# Patient Record
Sex: Male | Born: 1950
Health system: Southern US, Community
[De-identification: ages and names within clinical notes are randomized; demographics above are authoritative.]

## PROBLEM LIST (undated history)

## (undated) DIAGNOSIS — M25519 Pain in unspecified shoulder: Secondary | ICD-10-CM

## (undated) DIAGNOSIS — I10 Essential (primary) hypertension: Secondary | ICD-10-CM

## (undated) DIAGNOSIS — E785 Hyperlipidemia, unspecified: Secondary | ICD-10-CM

## (undated) DIAGNOSIS — I679 Cerebrovascular disease, unspecified: Secondary | ICD-10-CM

## (undated) DIAGNOSIS — I2510 Atherosclerotic heart disease of native coronary artery without angina pectoris: Secondary | ICD-10-CM

## (undated) HISTORY — DX: Atherosclerotic heart disease of native coronary artery without angina pectoris: I25.10

## (undated) HISTORY — DX: Cerebrovascular disease, unspecified: I67.9

## (undated) HISTORY — PX: CAROTID ENDARTERECTOMY: SUR193

## (undated) HISTORY — DX: Essential (primary) hypertension: I10

## (undated) HISTORY — DX: Hyperlipidemia, unspecified: E78.5

## (undated) HISTORY — DX: Pain in unspecified shoulder: M25.519

## (undated) HISTORY — PX: PTCA: SHX146

---

## 2004-01-29 ENCOUNTER — Ambulatory Visit: Payer: Self-pay | Admitting: Internal Medicine

## 2004-01-29 ENCOUNTER — Ambulatory Visit: Payer: Self-pay

## 2004-01-29 ENCOUNTER — Ambulatory Visit: Payer: Self-pay | Admitting: Family Medicine

## 2004-01-30 ENCOUNTER — Ambulatory Visit (HOSPITAL_COMMUNITY): Admission: RE | Admit: 2004-01-30 | Discharge: 2004-02-01 | Payer: Self-pay | Admitting: Internal Medicine

## 2004-02-02 ENCOUNTER — Ambulatory Visit: Payer: Self-pay | Admitting: Cardiology

## 2004-02-06 ENCOUNTER — Encounter (INDEPENDENT_AMBULATORY_CARE_PROVIDER_SITE_OTHER): Payer: Self-pay | Admitting: *Deleted

## 2004-02-06 ENCOUNTER — Inpatient Hospital Stay (HOSPITAL_COMMUNITY): Admission: RE | Admit: 2004-02-06 | Discharge: 2004-02-10 | Payer: Self-pay | Admitting: Vascular Surgery

## 2004-02-19 ENCOUNTER — Observation Stay (HOSPITAL_COMMUNITY): Admission: EM | Admit: 2004-02-19 | Discharge: 2004-02-21 | Payer: Self-pay | Admitting: Emergency Medicine

## 2004-02-19 ENCOUNTER — Ambulatory Visit: Payer: Self-pay | Admitting: Internal Medicine

## 2004-02-23 ENCOUNTER — Ambulatory Visit: Payer: Self-pay

## 2004-03-05 ENCOUNTER — Ambulatory Visit: Payer: Self-pay | Admitting: Internal Medicine

## 2004-04-02 ENCOUNTER — Ambulatory Visit: Payer: Self-pay | Admitting: Internal Medicine

## 2004-04-07 ENCOUNTER — Ambulatory Visit: Payer: Self-pay | Admitting: Cardiology

## 2004-04-07 ENCOUNTER — Ambulatory Visit: Payer: Self-pay | Admitting: Internal Medicine

## 2004-04-07 ENCOUNTER — Ambulatory Visit: Payer: Self-pay

## 2004-04-07 ENCOUNTER — Inpatient Hospital Stay (HOSPITAL_COMMUNITY): Admission: AD | Admit: 2004-04-07 | Discharge: 2004-04-09 | Payer: Self-pay | Admitting: Cardiology

## 2004-04-23 ENCOUNTER — Ambulatory Visit: Payer: Self-pay | Admitting: Internal Medicine

## 2004-09-09 ENCOUNTER — Ambulatory Visit: Payer: Self-pay | Admitting: Internal Medicine

## 2005-01-03 ENCOUNTER — Ambulatory Visit: Payer: Self-pay | Admitting: Internal Medicine

## 2005-02-08 ENCOUNTER — Ambulatory Visit: Payer: Self-pay | Admitting: Family Medicine

## 2005-03-10 ENCOUNTER — Inpatient Hospital Stay (HOSPITAL_COMMUNITY): Admission: RE | Admit: 2005-03-10 | Discharge: 2005-03-11 | Payer: Self-pay | Admitting: Vascular Surgery

## 2005-03-10 ENCOUNTER — Encounter (INDEPENDENT_AMBULATORY_CARE_PROVIDER_SITE_OTHER): Payer: Self-pay | Admitting: *Deleted

## 2005-10-24 ENCOUNTER — Ambulatory Visit: Payer: Self-pay | Admitting: Internal Medicine

## 2006-06-01 ENCOUNTER — Other Ambulatory Visit: Payer: Self-pay

## 2006-06-01 ENCOUNTER — Emergency Department: Payer: Self-pay | Admitting: Unknown Physician Specialty

## 2006-06-05 ENCOUNTER — Telehealth: Payer: Self-pay | Admitting: Family Medicine

## 2006-06-05 DIAGNOSIS — M25519 Pain in unspecified shoulder: Secondary | ICD-10-CM

## 2006-06-12 ENCOUNTER — Ambulatory Visit: Payer: Self-pay | Admitting: Internal Medicine

## 2006-12-18 ENCOUNTER — Ambulatory Visit: Payer: Self-pay | Admitting: Internal Medicine

## 2006-12-18 LAB — CONVERTED CEMR LAB
ALT: 47 units/L (ref 0–53)
Total CHOL/HDL Ratio: 4.4
Triglycerides: 150 mg/dL — ABNORMAL HIGH (ref 0–149)

## 2006-12-22 ENCOUNTER — Ambulatory Visit: Payer: Self-pay | Admitting: Internal Medicine

## 2007-08-21 ENCOUNTER — Ambulatory Visit: Payer: Self-pay | Admitting: Internal Medicine

## 2007-08-21 LAB — CONVERTED CEMR LAB
AST: 27 units/L (ref 0–37)
HDL: 37.9 mg/dL — ABNORMAL LOW (ref >39.0)
Total CHOL/HDL Ratio: 4.1
VLDL: 18 mg/dL (ref 0–40)

## 2007-10-19 ENCOUNTER — Ambulatory Visit: Payer: Self-pay | Admitting: Internal Medicine

## 2007-11-13 ENCOUNTER — Ambulatory Visit: Payer: Self-pay | Admitting: Internal Medicine

## 2007-11-13 LAB — CONVERTED CEMR LAB
AST: 24 units/L (ref 0–37)
Total CHOL/HDL Ratio: 4.3
Triglycerides: 116 mg/dL (ref 0–149)

## 2008-04-16 ENCOUNTER — Ambulatory Visit: Payer: Self-pay | Admitting: Internal Medicine

## 2008-04-16 LAB — CONVERTED CEMR LAB: AST: 21 units/L (ref 0–37)

## 2008-04-19 DIAGNOSIS — E785 Hyperlipidemia, unspecified: Secondary | ICD-10-CM | POA: Insufficient documentation

## 2008-04-19 DIAGNOSIS — I2510 Atherosclerotic heart disease of native coronary artery without angina pectoris: Secondary | ICD-10-CM | POA: Insufficient documentation

## 2008-04-19 DIAGNOSIS — I10 Essential (primary) hypertension: Secondary | ICD-10-CM | POA: Insufficient documentation

## 2008-04-19 DIAGNOSIS — I679 Cerebrovascular disease, unspecified: Secondary | ICD-10-CM | POA: Insufficient documentation

## 2008-04-19 DIAGNOSIS — I200 Unstable angina: Secondary | ICD-10-CM | POA: Insufficient documentation

## 2008-04-28 ENCOUNTER — Encounter: Payer: Self-pay | Admitting: Internal Medicine

## 2008-04-28 ENCOUNTER — Ambulatory Visit: Payer: Self-pay | Admitting: Internal Medicine

## 2008-04-28 DIAGNOSIS — J329 Chronic sinusitis, unspecified: Secondary | ICD-10-CM | POA: Insufficient documentation

## 2008-11-04 ENCOUNTER — Encounter: Payer: Self-pay | Admitting: Internal Medicine

## 2008-11-24 ENCOUNTER — Telehealth (INDEPENDENT_AMBULATORY_CARE_PROVIDER_SITE_OTHER): Payer: Self-pay | Admitting: *Deleted

## 2009-02-10 ENCOUNTER — Ambulatory Visit: Payer: Self-pay | Admitting: Internal Medicine

## 2009-02-11 LAB — CONVERTED CEMR LAB
Cholesterol: 152 mg/dL (ref 0–200)
Triglycerides: 91 mg/dL (ref 0.0–149.0)

## 2009-02-12 ENCOUNTER — Ambulatory Visit: Payer: Self-pay | Admitting: Internal Medicine

## 2010-01-21 ENCOUNTER — Ambulatory Visit: Payer: Self-pay | Admitting: Internal Medicine

## 2010-02-23 NOTE — Assessment & Plan Note (Signed)
Summary: rov      Allergies Added: NKDA  Visit Type:  Follow-up Primary Provider:  dr Janice Norrie   History of Present Illness: Patient is a 60 year old with a history of CAD (s/p PTCA/stent of the RCA, cutting angioplasty of the LAD in 2006), CV dz (s/p bilateral CEA) and dyslipidemia.  I last saw him in April of last year. Since seen he has done ok.  He is on his feet all day at work without problem.  Denies chest pain, no shortness of breath (does have sinus issues).    Current Medications (verified): 1)  Amlodipine Besylate 5 Mg Tabs (Amlodipine Besylate) .... One By Mouth Daily 2)  Metoprolol Succinate 25 Mg Xr24h-Tab (Metoprolol Succinate) .... One By Mouth Daily 3)  Imdur 30 Mg Xr24h-Tab (Isosorbide Mononitrate) .... One By Mouth Daily 4)  Asprin 81mg  .... One By Mouth Daily 5)  Crestor 40 Mg Tabs (Rosuvastatin Calcium) .Marland Kitchen.. 1 Tablet Every Day Before Bedtime  Allergies (verified): No Known Drug Allergies  Past History:  Past Medical History: Last updated: 04/19/2008 DYSLIPIDEMIA (ICD-272.4) HYPERTENSION (ICD-401.9) CEREBROVASCULAR DISEASE (ICD-437.9) CAD (ICD-414.00) PAIN IN JOINT, SHOULDER (ICD-719.41)  Past Surgical History: Last updated: 04/19/2008  CVOD status post left carotid endarterectomy January 2006 by Dr.Early. status post PTCA and stent placement in January 2996. History of appendectomy. Right carotid endarterectomy with Dacron patch angioplasty by Dr. Tawanna Cooler Early. left carotid endarterectomy on February 06, 2004 by Dr. Arbie Cookey.  Social History: Last updated: 04/19/2008 He resides in [place]. He is employed by [employer] as a Production designer, theatre/television/film. Married  Tobacco Use - Yes.  Alcohol Use - yes  Review of Systems       All systems reviewed.  Negative to the above problem except as noted above.  Vital Signs:  Patient profile:   60 year old male Height:      72 inches Weight:      175 pounds BMI:     23.82 Pulse rate:   85 / minute BP sitting:   133  / 84  (left arm) Cuff size:   regular  Vitals Entered By: Burnett Kanaris, CNA (February 12, 2009 4:31 PM)  Physical Exam  Additional Exam:  HEENT:  Normocephalic, atraumatic. EOMI, PERRLA.  Neck: JVP is normal. No thyromegaly. No bruits.  Lungs: clear to auscultation. No rales no wheezes.  Heart: Regular rate and rhythm. Normal S1, S2. No S3.   No significant murmurs. PMI not displaced.  Abdomen:  Supple, nontender. Normal bowel sounds. No masses. No hepatomegaly.  Extremities:   Good distal pulses throughout. No lower extremity edema.  Musculoskeletal :moving all extremities.  Neuro:   alert and oriented x3.    EKG  Procedure date:  02/12/2009  Findings:      NSR.  85 bpm.  Anteroseptal MI.  Impression & Recommendations:  Problem # 1:  CAD (ICD-414.00) Doing well.  NO signs to suggest ischemia.  Keep on same regimen.  Stay active.  Problem # 2:  DYSLIPIDEMIA (ICD-272.4) I increased Crestor to 40 and last lipid panel in late october LDL was 89, HDL was 44.  Check in 1 year. His updated medication list for this problem includes:    Crestor 40 Mg Tabs (Rosuvastatin calcium) .Marland Kitchen... 1 tablet every day before bedtime  Problem # 3:  CEREBROVASCULAR DISEASE (ICD-437.9) s/p CEA bilaterally.  Risk factor modification. Orders: EKG w/ Interpretation (93000)  Problem # 4:  HYPERTENSION (ICD-401.9) Adequate control. His updated medication list for this problem  includes:    Amlodipine Besylate 5 Mg Tabs (Amlodipine besylate) ..... One by mouth daily    Metoprolol Succinate 25 Mg Xr24h-tab (Metoprolol succinate) ..... One by mouth daily  Patient Instructions: 1)  F/U in 12 months.

## 2010-03-24 ENCOUNTER — Telehealth: Payer: Self-pay | Admitting: Internal Medicine

## 2010-03-31 ENCOUNTER — Encounter (INDEPENDENT_AMBULATORY_CARE_PROVIDER_SITE_OTHER): Payer: Self-pay | Admitting: *Deleted

## 2010-04-01 NOTE — Progress Notes (Signed)
Summary: question re lab work   Phone Note Call from Patient   Caller: Patient [phone] Reason for Call: Talk to Nurse Summary of Call: pt made fu appt with dr Tenny Craw does he need blood work Initial call taken by: Glynda Jaeger,  March 24, 2010 9:44 AM  Follow-up for Phone Call        Phone Call Completed, Appt Scheduled:  patient to come in on the 23rd of March(Friday) for fasting lipid and liver panel per notes of Dr. Tenny Craw. Follow-up by: Dessie Coma  LPN,  March 24, 2010 9:53 AM

## 2010-04-03 ENCOUNTER — Encounter: Payer: Self-pay | Admitting: Internal Medicine

## 2010-04-06 NOTE — Letter (Signed)
Summary: Appointment - Reschedule  [facility], Main Office  [address]   [place], [place] [address]   Phone: [phone]  Fax: [phone]     March 31, 2010 MRN: [id number]   [facility] [address] [place], [place]  [address]   Dear Mr. Hegner,   Due to a change in our office schedule, your appointment on March,26,2012 at 10:00 must be changed.  It is very important that we reach you to reschedule this appointment. We look forward to participating in your health care needs. Please contact us at the number listed above at your earliest convenience to reschedule this appointment.     Sincerely, Control and instrumentation engineer

## 2010-04-16 ENCOUNTER — Other Ambulatory Visit (INDEPENDENT_AMBULATORY_CARE_PROVIDER_SITE_OTHER): Payer: Self-pay | Admitting: *Deleted

## 2010-04-16 DIAGNOSIS — E7800 Pure hypercholesterolemia, unspecified: Secondary | ICD-10-CM

## 2010-04-16 DIAGNOSIS — Z79899 Other long term (current) drug therapy: Secondary | ICD-10-CM

## 2010-04-16 LAB — LIPID PANEL
HDL: 43.4 mg/dL (ref >39.00)
LDL Cholesterol: 78 mg/dL (ref 0–99)
Total CHOL/HDL Ratio: 3
Triglycerides: 145 mg/dL (ref 0.0–149.0)

## 2010-04-16 LAB — HEPATIC FUNCTION PANEL: Albumin: 4 g/dL (ref 3.5–5.2)

## 2010-04-19 ENCOUNTER — Ambulatory Visit: Payer: Self-pay | Admitting: Internal Medicine

## 2010-04-22 ENCOUNTER — Other Ambulatory Visit: Payer: Self-pay | Admitting: Cardiology

## 2010-04-22 DIAGNOSIS — I10 Essential (primary) hypertension: Secondary | ICD-10-CM

## 2010-04-22 NOTE — Telephone Encounter (Signed)
Called in to pharmacy gave pt ISOSORBIDE MN ER 30 MG TABLET daily he was requesting 60mg  1/2 tab qd due to the pharmacy being out of stock. Pharmacy states they have them in now so I refilled this as prescribed

## 2010-04-23 ENCOUNTER — Encounter: Payer: Self-pay | Admitting: Internal Medicine

## 2010-04-23 ENCOUNTER — Ambulatory Visit (INDEPENDENT_AMBULATORY_CARE_PROVIDER_SITE_OTHER): Payer: 59 | Admitting: Internal Medicine

## 2010-04-23 DIAGNOSIS — I2510 Atherosclerotic heart disease of native coronary artery without angina pectoris: Secondary | ICD-10-CM

## 2010-04-23 DIAGNOSIS — I10 Essential (primary) hypertension: Secondary | ICD-10-CM

## 2010-04-23 DIAGNOSIS — E785 Hyperlipidemia, unspecified: Secondary | ICD-10-CM

## 2010-04-23 NOTE — Assessment & Plan Note (Signed)
No symptoms to suggest ischemia. I would keep him on the same regimen.

## 2010-04-23 NOTE — Patient Instructions (Signed)
Your physician recommends that you schedule a follow-up appointment in: 12 months with Dr Tenny Craw.

## 2010-04-23 NOTE — Assessment & Plan Note (Signed)
He says his blood pressure is better at home in the 120/70 range. I told him he needs to bring his cuff in periodically the doctor's office to make sure it is working correctly. I would not change his regimen.

## 2010-04-23 NOTE — Assessment & Plan Note (Signed)
Lipids from last week show good control with an LDL of 78, HDL of 43. Keep on same regimen.

## 2010-04-23 NOTE — Progress Notes (Signed)
   Patient ID: [name], male    DOB: 04-23-1950, 60 y.o.   MRN: [id number] HPI Patient is a 60 year old with a history of CAD (s/p PTCA/stent of the RCA, cutting angioplasty of the LAD in 2006), CV dz (s/p bilateral CEA) and dyslipidemia.  I last saw him in April of last year. Since I saw him he has done well. He denies chest pain, shortness of breath, change in his ability to do things. Unfortunately, he has been laid off from his job. He is working more at home. No Known Allergies  Current Outpatient Prescriptions  Medication Sig Dispense Refill  . amLODipine (NORVASC) 5 MG tablet Take 5 mg by mouth daily.        Marland Kitchen aspirin 81 MG tablet Take 81 mg by mouth daily.        . isosorbide mononitrate (IMDUR) 30 MG 24 hr tablet Take 1 tablet (30 mg total) by mouth daily.  30 tablet  11  . metoprolol succinate (TOPROL-XL) 25 MG 24 hr tablet Take 25 mg by mouth daily.        . rosuvastatin (CRESTOR) 40 MG tablet Take 40 mg by mouth daily.          Past Medical History  Diagnosis Date  . Dyslipidemia   . Hypertension   . Cerebrovascular disease   . Coronary artery disease   . Pain in joint, shoulder region     Past Surgical History  Procedure Date  . Ptca     stent placement January 2006  . Carotid endarterectomy     left/Jan. 2006/ right;Dacron patch angioplasty    Family History  Problem Relation Age of Onset  . Alzheimer's disease Mother   . Heart disease Father     History   Social History  . Marital Status: Married    Spouse Name: N/A    Number of Children: N/A  . Years of Education: N/A   Occupational History  . Production designer, theatre/television/film     [employer]   Social History Main Topics  . Smoking status: Smoker, Current Status Unknown  . Smokeless tobacco: Not on file  . Alcohol Use: Yes  . Drug Use: Not on file  . Sexually Active: Not on file   Other Topics Concern  . Not on file   Social History Narrative  . No narrative on file    Review of Systems:  All systems  reviewed.  They are negative to the above problem except as previously stated.  Vital Signs: BP 146/93  Pulse 84  Resp 18  Ht 5\' 11"  (1.803 m)  Wt 167 lb 1.9 oz (75.805 kg)  BMI 23.31 kg/m2  Physical Exam  HEENT:  Normocephalic, atraumatic. EOMI, PERRLA.  Neck: JVP is normal. No thyromegaly. No bruits.  Lungs: clear to auscultation. No rales no wheezes.  Heart: Regular rate and rhythm. Normal S1, S2. No S3.   No significant murmurs. PMI not displaced.  Abdomen:  Supple, nontender. Normal bowel sounds. No masses. No hepatomegaly.  Extremities:   Good distal pulses throughout. No lower extremity edema.  Musculoskeletal :moving all extremities.  Neuro:   alert and oriented x3.  CN II-XII grossly intact.  EKG:  SR.  72 bpm.  Septal MI.  Assessment and Plan:    HPI    Review of Systems    Physical Exam

## 2010-05-02 ENCOUNTER — Other Ambulatory Visit: Payer: Self-pay | Admitting: Internal Medicine

## 2010-06-08 NOTE — Assessment & Plan Note (Signed)
[facility]                            CARDIOLOGY OFFICE NOTE   NAME:[name], [name]                        MRN:          [id number]  DATE:10/19/2007                            DOB:          [birth year]    IDENTIFICATION:  [name] is a 60 year old gentleman with CAD,  cerebrovascular disease, and dyslipidemia.  He comes in today for  continued care.   On talking the patient, he denies shortness of breath.  No chest pain.  He has noted no change in his ability to do things.   He does note a longstanding rash since he says the procedure he had  done.  He also notes some sinus issues with congestion.   His current medicines include:  1. Toprol-XL 25.  2. Isorbid 30.  3. Amlodipine 5.  4. Crestor 20.  5. Aspirin 81.  6. Fish oil.   PHYSICAL EXAMINATION:  GENERAL:  The patient is in no distress at rest.  VITAL SIGNS:  Blood pressure is 139/94, pulse is 78, and weight 174.  LUNGS:  Clear.  NECK:  JVP is normal.  Bilateral CEA scars.  CARDIAC:  Regular rate and rhythm.  S1 and S2.  No S3.  No significant  murmurs.  ABDOMEN:  Benign.  EXTREMITIES:  No edema.  SKIN:  Urticarial type rash plus serpiginous rash in his arms and back.   IMPRESSION:  1. Coronary artery disease.  I am not concerned of any active issues      and I think he is doing well clinically.  He is status post      percutaneous transluminal coronary angioplasty to an ostial      diagonal and percutaneous transluminal coronary angioplasty/stent      (drug-eluting) to the restenotic area of the RCA.  This was done in      2006.  I would keep him on his current regimen.  2. Dyslipidemia.  We will again need to have fasting lipids done and I      will follow up with him.  3. Hypertension.  The patient was stressed getting hear this is the      highest value.  I told him to keep an eye on things and send in a      log.  4. Rash.  He has had intermittent rash in the past, but I have  not      seen like this.  I will refer him to dermatology.   Otherwise, I will set to see him back in the spring, sooner if problems  develop.  I will be in touch with him the blood work.  Hopefully, I will  hear back from dermatology.     Pricilla Riffle, MD, [facility]  Electronically Signed    PVR/MedQ  DD: 10/22/2007  DT: 10/23/2007  Job #: [id number]

## 2010-06-08 NOTE — Assessment & Plan Note (Signed)
[facility]                                 ON-CALL NOTE   NAME:[name], [name]                MRN:          [id number]  DATE:06/01/2006                            DOB:          1950/12/16    PRIMARY CARDIOLOGIST:  Dr. Dietrich Pates.   I received a call from Dr. Aurelio Jew at [facility] emergency room. He stated  that he had [name] there who was a patient of Dr. Tenny Craw' and  had come in with atypical chest pain. He stated that his symptoms have  resolved and his initial enzymes were negative. Dr. Aurelio Jew did not feel  that any further inpatient evaluation was indicated but requested that  he be seen as soon as possible in our office for cardiac evaluation. I  have left with a message with our office to that effect and called Dr.  Aurelio Jew back to advise him that our office would be giving [name] a  call tomorrow to let him know when to come in. Dr. Aurelio Jew felt that this  was the appropriate plan of care.      Theodore Demark, PA-C  Electronically Signed      Gerrit Friends. Dietrich Pates, MD, [facility]  Electronically Signed   RB/MedQ  DD: 06/01/2006  DT: 06/02/2006  Job #: [id number]

## 2010-06-08 NOTE — Assessment & Plan Note (Signed)
[facility]                            CARDIOLOGY OFFICE NOTE   NAME:[name], [name]                        MRN:          [id number]  DATE:12/22/2006                            DOB:          [birth year]    IDENTIFICATION:  [name] is a 60 year old gentleman with a history of  CAD.  His last procedure was in March, 2006, he underwent cutting  balloon angioplasty to an ostial diagonal lesion and PTCA stent (drug  eluting) to a restenotic lesion of the RCA.  I last saw him in May.   In the interval he denies chest pressure, no shortness of breath, he is  active. He only notes that he wakes up a night urinating a little more  frequently, 2-3 times.  He says there is no pain on urination and no  blood in his urine.  He says he will have 1 or 2 cokes after dinner but  this the usual for him.   He is still smoking some, about 1/3 of a pack per day. Has some achiness  in his calves all the time, not with walking.   CURRENT MEDICATIONS:  Include aspirin 325 mg, Metoprolol ER 25, Plavix  75, Isorbid 30, Vytorin 10/40, amlodipine.   REVIEW OF SYSTEMS:  Rash intermittently on body.   PHYSICAL EXAMINATION:  The patient is in no distress.  Blood pressure  124/81, pulse is 79, weight 169.  LUNGS:  Clear.  CARDIAC EXAM:  Regular rate and rhythm S1, S2, no S3, no murmurs.  EXTREMITIES:  Small macular rash on arms (a few lesions actually).   IMPRESSION:  1. Coronary artery disease.  Clinically doing well.  He is now more      than a couple of years out from his last intervention.  He is off      of aspirin but taking Plavix.  I told him he can stop Plavix and      start 81 mg aspirin.  2. Dyslipidemia. Achy on the Vytorin, he had been on Lipitor in the      past and actually his lipid panel was better. Now his LDL is 93,      HDL of 36. I would stop the Vytorin.  I would like him to call in a      couple of weeks to see how he is doing.  I have given him  prescriptions for Lipitor 80 as well as some samples.  Will need      followup lipids in about 12 weeks if he starts on this.  3. Renal.  Will check a urinalysis. May indeed need to see a      urologist.   Set to see him back in the spring or summer, sooner if problems develop.  He was given a flu shot today.    Pricilla Riffle, MD, [facility]  Electronically Signed   PVR/MedQ  DD: 12/22/2006  DT: 12/22/2006  Job #: [id number]

## 2010-06-08 NOTE — Assessment & Plan Note (Signed)
[facility]                            CARDIOLOGY OFFICE NOTE   NAME:[name], [name]                        MRN:          [id number]  DATE:06/12/2006                            DOB:          Jul 24, 1950    PATIENT IDENTIFICATION:  [name] is a 60 year old gentleman with a  history of CAD. He was last seen in October of last year. He is status  post PTCA stent to the  RCA and last procedure underwent PTCA stent to the RCA and cutting  balloon angioplasty of LAD. The patient has a CYPHER stent in the RCA.   Since seen, he has been doing well from a cardiac standpoint. He denies  chest pain, active, still smoking.   His biggest complaint today is left shoulder and arm pain. Note he was  actually ruled out for MI at [facility] about a week  ago. He has been seen at [facility] for his shoulder and is  due to have a CT scan done.   CURRENT MEDICATIONS:  1. Aspirin 325 daily.  2. Metoprolol XL 25 daily.  3. Plavix 75 daily.  4. Isorbid 30 daily.  5. Vytorin 10/40 daily.  6. Amlodipine 5 daily.   PHYSICAL EXAMINATION:  GENERAL:  The patient is in no distress.  VITAL SIGNS:  Blood pressure 127/83, pulse is 70, weight 170.  NECK:  JVP is normal. Bilateral CEA scars.  LUNGS:  Clear. No rales.  CARDIAC:  Regular rate and rhythm, S1, S2, no S3. No murmurs.  ABDOMEN:  Benign.  EXTREMITIES:  No edema. 2+ pulses, did not examine left shoulder or arm.   A 12-lead EKG normal sinus rhythm, 74 beats per minute. Occasional PVC.  Anteroseptal MI. Unchanged from previous.   IMPRESSION:  1. Coronary artery disease. Clinically stable. Again, last      intervention was in March of 2006. The patient underwent cutting      balloon angioplasty of the ostial diagonal to the left anterior      descending and PTCA stent (drug-eluting) of a restenotic lesion in      the right coronary artery. He has been active until his shoulder      and  arm began hurting. Overall, I think he should be at low risk      from a cardiac standpoint. He is over 1 year from stent placement.      If needed, he can come off Plavix but I would resume as soon as      possible and he should continue on the Plavix.  2. Dyslipidemia. Will need to check on fasting lipid panel when he      returns.  3. Hypertension. Good control.   All set to see the patient back in about 6 months, sooner if problems  develop.     Pricilla Riffle, MD, [facility]  Electronically Signed    PVR/MedQ  DD: 06/12/2006  DT: 06/12/2006  Job #: [id number]   cc:   [facility]

## 2010-06-11 NOTE — Cardiovascular Report (Signed)
NAMEALYAAN, BUDZYNSKI              ACCOUNT NO.:  [id number]   MEDICAL RECORD NO.:  [id number]          PATIENT TYPE:  OIB   LOCATION:  2856                         FACILITY:  [facility]   PHYSICIAN:  Arturo Morton. Riley Kill, M.D. [facility] OF BIRTH:  02-09-1950   DATE OF PROCEDURE:  DATE OF DISCHARGE:                              CARDIAC CATHETERIZATION   Audio too short to transcribe (less than 5 seconds)       TDS/MEDQ  D:  01/30/2004  T:  01/30/2004  Job:  [id number]

## 2010-06-11 NOTE — Cardiovascular Report (Signed)
NAMEPARLEY, [name]              ACCOUNT NO.:  [id number]   MEDICAL RECORD NO.:  [id number]          PATIENT TYPE:  OIB   LOCATION:  2856                         FACILITY:  [facility]   PHYSICIAN:  [name], M.D. [facility] OF BIRTH:  16-Oct-1950   DATE OF PROCEDURE:  01/30/2004  DATE OF DISCHARGE:                              CARDIAC CATHETERIZATION   INDICATIONS:  [name] is a 60 year old gentleman who presented with an  episode of chest pain.  On examination, he was noted to have bilateral  carotid bruits, and noninvasive studies suggested high-grade bilateral  carotid disease.  This was being reviewed by Dr. Samule [name].  In addition, the  patient has had an episode of chest pain and has multiple cardiac risk  factors.  He also has an abnormal chest x-ray suggesting fullness in the  right hilum.  With this, it was felt that cardiac catheterization was  indicated.   PROCEDURE:  1.  Left heart catheterization.  2.  Selective coronary arteriography.  3.  Selective left ventriculography.  4.  Subclavian angiography.   OPERATOR:  [name], M.D.   DESCRIPTION OF PROCEDURE:  The patient was taken to the cardiac  catheterization lab, prepped and draped in the usual fashion.  Through an  anterior puncture, the right femoral artery was easily entered.  A 6 French  sheath was placed. The central aortic and left ventricular pressures were  measured with a pigtail.  Ventriculography was performed in the RAO  projection.  Following a pressure pull-back, coronary angiograms were  performed.   There was initial damping of the catheter in the left coronary artery.  We  then pulled this out and we exchanged for a right coronary catheter.  Using  the right coronary artery, we obtained before and after intracoronary  nitroglycerin. Subclavian angiography was then performed. We used a JL-4.5  guiding catheter and then injected the left coronary.  It was evident that  there was a  separate ostium for both the LAD and the circumflex, and that  the LAD itself had an ostial 40-50% narrowing.   Following this, we took a JL-5 catheter to engage the circumflex artery,  which was a large, independent artery.  The patient ended up tolerating the  procedure well.   I then discussed the case with both Dr. Tenny [name] and Dr. Samule [name], and then  discussed the case with the patient's family.  He was taken to the holding  area in satisfactory, clinical condition.   HEMODYNAMIC DATA:  1.  Central aortic pressure was 150/92 with a mean of 116.  2.  Left ventricular pressure 148/8.  3.  No gradient in pullback across the aortic valve.   ANGIOGRAPHIC DATA:  1.  Ventriculography was performed in the RAO projection because of      ventricular ectopy. Angiographic ejection fraction could not be      calculated, however, no definite wall motion abnormalities were seen and      overall EF appeared to be well-preserved.  2.  The left anterior descending artery and circumflex have separate ostia.  3.  The  left anterior descending artery courses to the apex.  There is a      major diagonal branch.  The ostium of the LAD is difficult to lay out      but appears to have about a 50% area of segmental plaquing proximally.      Beyond this, there is a focal area of tapered narrowing of about 40%.      The LAD beyond this has a fair amount of luminal irregularity but no      critical stenoses.  The diagonal is a fairly small to moderate sized      vessel that has about 80% ostial narrowing, and then a second 80%      narrowing in its mid-portion.  It appears to be about a  1.5-2 mm artery.  1.  The circumflex is a large caliber vessel.  There is a tiny, first      marginal branch and then an extremely large, second marginal branch.      These have luminal irregularities only.  There is about a 40% area of      segmental plaquing in the AV circumflex after the take-off of the 3      marginal  branches. This leads into a posterolateral system that has 3      posterolateral branches and a co-PDA.  These vessels do not have      critical narrowing, other than the segmental area of 40% narrowing as      previously noted.  2.  The right coronary artery provides a simple, posterior descending artery      which is small in caliber.  In the mid-portion, there are tandem lesions      of 50, 80, 70 and 50%, in what appears to be about a 2 mm artery.   CONCLUSIONS:  1.  Preserved overall left ventricular function.  2.  High grade stenosis of the right coronary artery and diagonal vessel.  3.  Moderate stenosis of the ostium of the left anterior descending artery      and mid-left anterior descending.  4.  Large dominant, co-dominant circumflex system.   DISPOSITION:  I have discussed the case with Dr. Samule [name], he is reviewing the  carotid studies.  We may need to deal with the carotids prior to dealing  with the coronaries.  The right could be stented, although a non-drug  eluding platform will likely be needed, given vessel size.  We will defer  coronary intervention until a plan regarding his carotids is dealt with.       TDS/MEDQ  D:  01/30/2004  T:  01/30/2004  Job:  [id number]   cc:   [name], M.D. [facility]   [name], M.D.   CV Laboratory   [name], M.D. [facility]  [address]  [address]  [place]  [place] [address]

## 2010-06-11 NOTE — H&P (Signed)
[name], [name]              ACCOUNT NO.:  [id number]   MEDICAL RECORD NO.:  [id number]          PATIENT TYPE:  INP   LOCATION:  NA                           FACILITY:  [facility]   PHYSICIAN:  Larina Earthly, M.D.    DATE OF BIRTH:  November 16, 1950   DATE OF ADMISSION:  02/06/2004  DATE OF DISCHARGE:                                HISTORY & PHYSICAL   CHIEF COMPLAINT:  Carotid artery disease.   HISTORY OF PRESENT ILLNESS:  This is a 60 year old male who was hospitalized  last week after an apparent episode of chest pain.  He is somewhat of a poor  historian and does not have the results of the numerous tests that were  apparently performed.  Apparently, during the evaluation he was found to  have carotid bruits.  A Doppler examination reportedly shows severe  bilateral disease.  He was seen by Dr. Tawanna Cooler Early for vascular surgical  consultation, and he has subsequently been recommended for a left carotid  endarterectomy.  The patient has stated he reports he had multiple tests,  however is unsure of any other diagnostic findings as related to his cardiac  evaluation.  He has an unclear understanding of the whole clinical situation  but does understand that the carotid disease is quite significant and the  procedure is to be done for stroke prevention.  He denies headache, nausea,  vomiting, vertigo or dizziness, history of falls, seizures, muscle weakness,  speech impairment, dysphagia, syncope, presyncope, memory loss, or  confusion.  He does admit to some occasional numbness and tingling in the  right arm.  He also admits to an episode of right eye blurred vision lasting  a few minutes several weeks ago.  It was not frank amaurosis fugax.  He will  be admitted on February 06, 2004, for elective left carotid endarterectomy.  Of note, the patient did understand that he was supposed to have a right  carotid endarterectomy.   PAST MEDICAL HISTORY:  1.  Carotid artery disease.  2.   Questionable lipid status.  3.  Questionable coronary artery disease.   PAST SURGICAL HISTORY:  Appendectomy, age 60.   CURRENT MEDICATIONS:  1.  Aspirin 325 mg daily.  2.  Lipitor 80 mg daily.  3.  Toprol XL 25 mg daily.  4.  Nitroglycerin 0.4 mg p.r.n.  5.  He was also given a prescription for Wellbutrin 150 mg b.i.d. but is not      taking.   ALLERGIES:  None.   REVIEW OF SYSTEMS:  Otherwise noncontributory for full system review with  the exception of the aforementioned chest pain event from last week.   FAMILY HISTORY:  Remarkable for coronary disease.  His father is deceased at  age 28 secondary to myocardial infarction.   SOCIAL HISTORY:  He is married with one child.  He uses approximately a six-  pack of beer each week.  Tobacco use is approximately one pack per day for  40 years.  He is trying to quit and at current status is two to three  cigarettes per day.  PHYSICAL EXAMINATION:  VITAL SIGNS:  Blood pressure 124/74, pulse 76,  respirations 16.  GENERAL:  This is a 60 year old white male in no acute distress, alert and  oriented x3.  HEENT:  Normocephalic, atraumatic.  Pupils equal, round, and reactive to  light.  Extraocular movements intact.  Pharynx is clear.  Teeth are in fair  dentition.  NECK:  Supple, no jugular venous distention, positive carotid bruits, no  lymphadenopathy.  CHEST:  Symmetrical on inspiration.  No wheezes, rhonchi, or rales.  CARDIAC:  Regular rate and rhythm, normal S1, S2.  No murmurs, gallops, or  rubs.  ABDOMEN:  Soft, nontender, normoactive bowel sounds.  No masses, no bruits.  GENITOURINARY, RECTAL:  Deferred.  EXTREMITIES:  No clubbing, cyanosis, no edema, no ulcerations.  SKIN:  Warm to the touch.  PERIPHERAL PULSES:  Palpable posterior tibial bilateral.  NEUROLOGIC:  Nonfocal.  Cranial nerves II-XII grossly intact.  Gait is  steady.  Deep tendon reflexes equal and intact bilateral.  Muscle strength  equal and intact  bilateral.   ASSESSMENT:  Severe bilateral carotid artery disease, for planned left  carotid endarterectomy.  Other diagnoses as per history.      WEG/MEDQ  D:  02/04/2004  T:  02/04/2004  Job:  [id number]   cc:   Pricilla Riffle, M.D.

## 2010-06-11 NOTE — Cardiovascular Report (Signed)
[name], [name]              ACCOUNT NO.:  [id number]   MEDICAL RECORD NO.:  [id number]          PATIENT TYPE:  INP   LOCATION:  3305                         FACILITY:  [facility]   PHYSICIAN:  Salvadore Farber, M.D. LHCDATE OF BIRTH:  [birth year]   DATE OF PROCEDURE:  02/09/2004  DATE OF DISCHARGE:                              CARDIAC CATHETERIZATION   PROCEDURE:  Coronary angiography, drug-eluting stent placement x2 to the  RCA.   INDICATIONS:  Mr. Mecham as a 60 year old gentleman who presented 10 days  ago with chest discomfort. He ruled out for myocardial infarction and was  found to have bilateral greater than 95% carotid stenoses. He subsequently  underwent cardiac catheterization which demonstrated a severe stenosis of  the relatively small RCA. We carefully weighs cardiac versus cerebral risk  and decided to perform revascularization of his carotid first. This was  performed on February 06, 2004 by Dr. Arbie Cookey. He had no cardiac complications.  Yesterday he was started on Plavix in anticipation of revascularization of  the RCA. He has done well without neurologic or cardiac compromise since his  surgery. He comes the lab today for planned repeat angiography and  intervention on at least the RCA.   PROCEDURAL TECHNIQUE:  Informed consent was obtained. Under 1% lidocaine  local anesthesia, a 6-French sheath was placed in the right common femoral  artery using the modified Seldinger technique. Anticoagulation was initiated  with bivalirudin. ACT was confirmed to be greater than 225 seconds. I began  with coronary angiography of the left system using JL-4 for the circumflex  and JL-3.5 for the LAD.   Attention was then turned to the RCA. An ART 3.5 guide was advanced over  wire and engaged in the ostium of the RCA. A Prowater wire was advanced in  the distal vessel without difficulty. The distal portion of the lesion was  directly stented using a 2.5 x 20 mm Taxus deployed at  12 atmospheres. The  more proximal portion of the lesion was then stented using a 2.75 x 28 mm  Taxus deployed at 14 atmospheres. This stent was positioned so as to overlap  the previously placed stent by approximately 3 mm. The distal portion of the  distal stent was then postdilated using a 2.5 x 18 mm PowerSail at 14  atmospheres. The remainder of the stents were then postdilated using a 3.0 x  18 mm PowerSail for three sequential inflations at 16 atmospheres. Final  angiography demonstrated no residual stenosis, no dissection, and TIMI-3  flow to the distal vasculature. The patient tolerated the procedure well and  was transferred to the holding room in stable condition.   COMPLICATIONS:  None.   FINDINGS:  1.  Left main: There is no left main. There are separate ostia of the LAD      and circumflex.  2.  LAD: The LAD is unchanged from the study of January 30, 2004.      Specifically, there is a 50% ostial stenosis and a 40% proximal      stenosis. The vessel gives rise to 2 substantial diagonal branches. The  remainder of the vessel has only minor luminal irregularities.  3.  Circumflex: Moderate size vessel giving rise to 3 obtuse marginals.      There is 30% stenosis of the proximal vessel.  4.  RCA: Relatively smaller dominant vessel. There is a 30% stenosis of the      proximal vessel, diffuse disease of the mid vessel with up to 70%      stenosis. The RCA was treated with 2 overlapping drug-eluting stents      resulting in no residual stenosis.   IMPRESSION/RECOMMENDATIONS:  1.  No change in coronary anatomy compared with January 30, 2004.  2.  Successful drug-eluting stent placement (x2) in the mid RCA with      excellent angiographic result.   The patient should be continued on aspirin indefinitely and Plavix for at  least 1 year. Plavix should not be stopped during this period.      WED/MEDQ  D:  02/09/2004  T:  02/09/2004  Job:  [id number]   cc:   Pricilla Riffle,  M.D.   Marne A. Milinda Antis, M.D. [facility]

## 2010-06-11 NOTE — Assessment & Plan Note (Signed)
[facility]                                   ON-CALL NOTE   NAME:[name], [name]                        MRN:          [id number]  DATE:10/02/2005                            DOB:          1950/06/05    I received a call from [phone] through the answering service.  [name] states he was a patient of Dr. Tenny Craw'.  He had called last week to  get refill son his medications and had not heard anything back from our  office and states that his pharmacy had not received a refill permission.  I  ask him what pharmacy he used.  It is [facility] in [place].  I called the  pharmacy at [phone] for refills on [name]' Norvasc 5 mg, 1 p.o. q.day,  #30 tablets with 6 refills; Vytorin 10/40 mg, 1 p.o. q.h.s., #30 tablets  with 6 refills; Wellbutrin 150 mg, 1 p.o. b.i.d., #60 tablets with 6  refills.  The patient was instructed to keep his follow-up appointment with  Dr. Tenny Craw this week.                                   Dorian Pod, ACNP   MB/MedQ  DD:  10/02/2005  DT:  10/02/2005  Job #:  [id number]

## 2010-06-11 NOTE — Discharge Summary (Signed)
[name], [name]                 ACCOUNT NO.:  [id number]   MEDICAL RECORD NO.:  [id number]          PATIENT TYPE:  INP   LOCATION:  2920                         FACILITY:  [facility]   PHYSICIAN:  Charlies Constable, M.D. [facility] DATE OF BIRTH:  06-03-1950   DATE OF ADMISSION:  04/07/2004  DATE OF DISCHARGE:  04/09/2004                           DISCHARGE SUMMARY - REFERRING   SUMMARY OF HISTORY:  [name] is a 60 year old white male who was seen in  the office on April 02, 2004 by Dr. Tenny Craw with atypical chest discomfort.  Given his history, Dr. Tenny Craw arranged a stress Myoview to be performed in the  office on March 15.  Apparently during the stress test he did well but  toward the end he developed chest discomfort and as he was getting off the  treadmill for the recovery phase he clutched his chest and collapsed and was  assisted to the floor.  He was found in ventricular fibrillation.  Dr. Myrtis Ser  ran a code team which consisted of a shock of 120 joules x1, CPR,  epinephrine, and amiodarone with improvement of his blood pressure and  restoring normal sinus rhythm.  He was brought emergently to [facility] for  further evaluation.   His history is notable for stenting in January 2006 to the RCA.  Repeat  catheterization on the 26th of January showed patent stents but he had  moderate nonobstructive disease in the LAD.  He was placed on the Protonix  trial and was since then placed on Zantac secondary to the cost.  He has a  history of hyperlipidemia, continued tobacco use, cerebrovascular disease  with status post left carotid endarterectomy; however, he has not had any  follow-up with Dr. Arbie Cookey, and a history of hypertension.   LABORATORY DATA:  Admission H&H was 12.9 and 36.3, normal indices, platelets  239, WBC 10.0.  Subsequent hematologies were unremarkable.  Admission PT was  15.0.  Admission sodium 129, potassium 3.9, BUN 9, creatinine 1.0, glucose  121.  Subsequent chemistries were  unremarkable.  Normal LFTs.  CK-MBs were  negative for myocardial infarction.  However, troponins were slightly  elevated at 0.18, 0.38, and 0.61, and 0.46.  Blood cultures are pending at  the time of this dictation.  Prior to discharge H&H was 13.0 and 37.2,  normal indices, platelets 203, WBC 6.1.  Sodium 140, potassium 3.9, BUN 9,  creatinine 0.9, glucose 134.  During this admission cholesterol was 138,  triglycerides 79, HDL 42.4, LDL 80.  Again, with normal LFTs.   HOSPITAL COURSE:  Mr. [name] was taken emergently to the catheterization  laboratory by Dr. Juanda Chance.  Please refer to Dr. Regino Schultze dictated report.  According to progress note he had EF of 60%.  There was a new 90% lesion in  the proximal RCA above the prior stent site.  He underwent CYPHER stenting  to the proximal RCA and he also underwent angioplasty to the diagonal 1  reducing this from 90 to 40%.  He does have residual nonobstructive lesions  in his LAD.  Dr. Juanda Chance noted that he is  not certain a culprit for ischemia  resulting in ventricular fibrillation.  He did comment that the EKG looked  like LAD territory.  However, he suspected the culprit was actually the RCA.  Later that afternoon Dr. Tenny Craw checked on him and the patient stated that he  was sore all over.  Dr. Tenny Craw adjusted his medications.  Cardiac  rehabilitation became involved in his care and assisted with education and  ambulation.  Tobacco cessation consult was also performed.  Dr. Tenny Craw on the  evening of March 16 had reviewed his films and felt that there may have been  a spasmodic component.  She added Imdur to his medical regimen.  On the 17th  he was reassessed by Dr. Tenny Craw and it was felt that he was doing well.  It  was felt that he could be discharged home later that afternoon.  Cardiac  rehabilitation also assisted and recommended joining phase II in [place].   DISCHARGE DIAGNOSES:  1.  Unstable angina.  Positive stress Myoview resulting in  ventricular      fibrillation arrest in our office resulting in emergent catheterization.  2.  Status post CYPHER stenting to the proximal right coronary artery and      status post angioplasty to the diagonal 1 as previously described.  3.  Residual coronary artery disease as previously described.  4.  Slightly elevated troponins.  5.  Tobacco use.  6.  Febrile.  7.  Uncertain etiology hyperlipidemia.  8.  Hypertension, history as previously.   DISPOSITION:  He is discharged home.  His new medications include Imdur 30  mg daily, Wellbutrin 150 mg b.i.d. for tobacco cessation.  He is asked to  continue his Plavix 75 mg daily, aspirin 325 daily, Lipitor 80 mg q.h.s.,  Toprol XL 25 mg daily, and nitroglycerin p.r.n.  He was asked not to take  his lisinopril.  He was advised no lifting, no driving, sexual activity, or  heavy exertion for one week.  Maintain low salt, fat, cholesterol diet.  He  was asked to observe his catheterization site for any problems.  He was  advised no smoking or tobacco products.  He will follow up with Dr. Tenny Craw in  the office on Friday, March 31 at 3 p.m.  At the time of follow-up Dr. Tenny Craw  will review his in-hospitalization lipids and LFTs to determine if he needs  any adjustments on his lipid agents.  She will also discuss possible EP  evaluation with Dr. Juanda Chance and review any necessary follow-up that may need  to take place with Dr. Arbie Cookey in regards to his cerebrovascular disease.      EW/MEDQ  D:  04/09/2004  T:  04/09/2004  Job:  [id number]   cc:   Pricilla Riffle, M.D.   Larina Earthly, M.D.  [address]  [place]  [place] [address]   Idamae Schuller A. Milinda Antis, M.D. [facility]

## 2010-06-11 NOTE — Discharge Summary (Signed)
NAMEDEROY, NOAH                 ACCOUNT NO.:  [id number]   MEDICAL RECORD NO.:  [id number]          PATIENT TYPE:  INP   LOCATION:  3304                         FACILITY:  [facility]   PHYSICIAN:  Larina Earthly, M.D.    DATE OF BIRTH:  28-Feb-1950   DATE OF ADMISSION:  03/10/2005  DATE OF DISCHARGE:  03/11/2005                                 DISCHARGE SUMMARY   ADMITTING DIAGNOSES:  Asymptomatic right internal carotid artery stenosis.   DISCHARGE/SECONDARY DIAGNOSES:  1.  Asymptomatic right internal carotid artery stenosis status post right      carotid endarterectomy.  2.  ECCVOD status post left carotid endarterectomy January 2006 by Dr.      Arbie Cookey.  3.  Dyslipidemia.  4.  Coronary artery disease status post PTCA and stent placement in January      2996.  5.  History of appendectomy.  6.  No known drug allergies.   PROCEDURES:  March 10, 2005:  Right carotid endarterectomy with Dacron  patch angioplasty by Dr. Gretta Began.   BRIEF HISTORY:  Mr. Kopera is a 60 year old Caucasian male who was found to  have bilateral carotid artery stenosis during a cardiac work-up which  occurred approximately one year ago.  He was asymptomatic at that time and  underwent a left carotid endarterectomy on February 06, 2004 by Dr. Arbie Cookey.  He has since recovered without any problems and has been doing well.  He  returned for follow-up recently and a duplex scan showed progression of his  right internal carotid artery stenosis now to 80-99%.  He continues to  remain asymptomatic, although he experienced some right arm tingling which  was his initial presenting symptom.  He also recently experienced some right  eye transient loss of vision which he attributed to discontinuation of a  medication.  This resolved without any problem and he had no other visual  changes.  Due to the progression of his right carotid artery stenosis Dr.  Arbie Cookey recommended that he undergo right carotid endarterectomy to  reduce his  risk for future stroke.   HOSPITAL COURSE:  Mr. Leverette was electively admitted to [facility]  on March 10, 2005 and underwent right carotid endarterectomy.  There were  no known intraoperative complications and he was extubated neurologically  intact.  After a short stay in recovery unit he was transferred to stepdown  unit 3300 and remained there through his discharge.  On postoperative day  one he had remained hemodynamically stable overnight.  He did initially  require some dopamine for hypotension immediately postoperatively but by  morning this had been weaned and vitals were stable with blood pressure  105/45, heart rate in the 50s and 60s, oxygen saturation 96% on room air and  he remained afebrile.  His Foley catheter and arterial line were  discontinued that morning.  Later he was able to void without difficulty.  Postoperative laboratories were also stable showing a white blood count of  8.3, hemoglobin 11.6, hematocrit 33.6, platelet count 219.  Sodium 138,  potassium 3.9, BUN 4, creatinine 0.8, and  blood glucose 116.  Also that  morning he was able to tolerate a regular diet and was able to ambulate  without difficulty.  On examination his heart had a regular rate and rhythm.  Lungs were clear.  Abdomen examination was benign and neurologically he  remained intact and there was no evidence of hematoma at his right neck  incisions and appeared clean, dry, and intact.  Since Mr. Perrow had met  criteria for discharge he is felt stable and discharged home on  postoperative day one March 11, 2005.   DISCHARGE MEDICATIONS:  1.  Tylox one to two tablets p.o. q.4h. p.r.n. pain.  2.  Metoprolol 25 mg daily.  3.  Norvasc 5 mg daily.  4.  Wellbutrin 150 mg twice daily.  5.  Plavix 75 mg daily.  6.  Nasonex daily.  7.  Vytorin 10/40 mg daily.  8.  Isosorbide 30 mg daily.  9.  Aspirin 325 mg daily.  10. Sublingual nitroglycerin p.r.n.   DISCHARGE  INSTRUCTIONS:  He was instructed to avoid driving or heavy lifting  for the next two to three weeks.  Encouraged to continue daily walking  exercises.  He may shower starting February 17 and may clean his incision  gently with soap and water.  He is to continue to follow a low fat, low salt  diet.  He is to follow up with Dr. Tawanna Cooler Early at the [facility] office on April 04, 2004 at 12:10 p.m. and should call sooner if needed.      Jerold Coombe, P.A.      Larina Earthly, M.D.  Electronically Signed    AWZ/MEDQ  D:  03/11/2005  T:  03/11/2005  Job:  [id number]   cc:   Marne A. Tower, M.D. [facility]  [address] [place]  [place] [address]   Pricilla Riffle, M.D.  [address]  [place]  [place] [address]

## 2010-06-11 NOTE — H&P (Signed)
[name], [name]                 ACCOUNT NO.:  [id number]   MEDICAL RECORD NO.:  [id number]          PATIENT TYPE:  INP   LOCATION:  1827                         FACILITY:  [facility]   PHYSICIAN:  Pricilla Riffle, M.D.    DATE OF BIRTH:  05/16/1950   DATE OF ADMISSION:  02/19/2004  DATE OF DISCHARGE:                                HISTORY & PHYSICAL   IDENTIFICATION:  [name] is a 60 year old gentleman who has a history of  coronary artery disease, status post recent PTCA stents to the RCA, now with  recurrent pain.   HISTORY OF PRESENT ILLNESS:  The patient was recently discharged from the  hospital on February 10, 2004. Cardiac catheterization on January 30, 2004,  showed a 50% proximal LAD lesion, 80% proximal diagonal, 70% distal  diagonal; left circumflex with a 40% lesion; RCA with 50% and 80% lesions by  report. EF was normal. At this time the patient was also found to have  severe bilateral ICA stenosis of 80% to 99%. He was seen by vascular  surgery, [facility], Dr. Kristen [name]. Early. It was arranged for him to have carotid  endarterectomy and he was discharged and readmitted on the 13th of January.  At that time he underwent a left carotid endarterectomy. After that, on  February 09, 2004, he had PTCA with overlapping drug-eluting stents placed to  the mid RCA. He was discharged home on aspirin, Plavix, and Lipitor.   On Monday, February 16, 2004, he had chest tightness. He had just gotten in  from getting from the paper. He had a heaviness for which he took one  nitroglycerin and it eased off. Tuesday, he felt tired and stayed in bed of  the most day. Wednesday, he was not doing anything in the morning. She  developed severe parasternal  and then substernal chest heaviness with  bilateral arm numbness and aching, like an elephant was sitting on his  chest. He took a nitroglycerin and it eased off in a few minutes. Today, he  went to see [name] in the office and with this history he  was referred  here for further evaluation. Here, in the office he complains of right  parasternal pain. He has taken one nitroglycerin. It went away with the  second nitroglycerin.   ALLERGIES:  None.   MEDICATIONS:  1.  Enteric-coated aspirin 81 mg daily.  2.  Lipitor 80 mg q.h.s.  3.  Toprol XL 25 mg daily.  4.  Wellbutrin 150 mg b.i.d.   PAST MEDICAL HISTORY:  1.  CAD.  2.  CV disease.  3.  Hypertension.  4.  Dyslipidemia. Of note the patient will at some point have a second      carotid endarterectomy done.   PAST SURGICAL HISTORY:  1.  Status post appendectomy.  2.  Status post left CEA.   SOCIAL HISTORY:  The patient has a history of tobacco use. Has a history of  one drink per night.   FAMILY HISTORY:  Father with an MI in his 44s.   PHYSICAL EXAMINATION:  GENERAL: On exam, currently the patient is pain free.  VITAL SIGNS: Blood pressure 130/98, pulse 66 and regular.  NECK: Status post left CEA, right bruit.  LUNGS: Clear to auscultation.  CARDIAC: Regular rate and rhythm. S1 and S2. No S3. Question S4. No murmurs.  ABDOMEN: Supple.  EXTREMITIES: Good distal pulses. No lower extremity edema.   A 12-lead EKG before pain showed sinus bradycardia at a rate of 55 beats per  minute.   IMPRESSION:  The patient is a 60 year old with known vascular disease,  status post recent CEA, status post PTCA stenting to the RCA, now with  recurrent pain. Discussed this with [name]. Would recommend admitting,  treat with heparin and nitroglycerin, and plan cardiac catheterization to  redefine anatomy.      PVR/MEDQ  D:  02/19/2004  T:  02/19/2004  Job:  [id number]

## 2010-06-11 NOTE — Consult Note (Signed)
NAMECLEAVON, GOLDMAN              ACCOUNT NO.:  [id number]   MEDICAL RECORD NO.:  [id number]          PATIENT TYPE:  INP   LOCATION:  2899                         FACILITY:  [facility]   PHYSICIAN:  Rollene Rotunda, M.D.   DATE OF BIRTH:  November 02, 1950   DATE OF CONSULTATION:  02/06/2004  DATE OF DISCHARGE:                                   CONSULTATION   PRIMARY:  Marne A. Milinda Antis, M.D. [facility].   CONSULTING:  Larina Earthly, M.D.   REASON FOR CONSULTATION:  Evaluate patient with unstable angina in the face  of known coronary disease.   HISTORY OF PRESENT ILLNESS:  The patient is a pleasant 60 year old gentleman  who underwent cardiac catheterization on January 30, 2004 for evaluation of  chest pain.  This demonstrated a 50% proximal LAD lesion, 80% diagonal  stenosis, distal 70% stenosis of diagonal side branch.  Circumflex had 40%  stenosis in OM.  The right coronary artery had multiple mid lesions between  50-80%.  His left ventriculogram was normal.  At this time, he was also  noted to have bilateral carotid artery stenosis of 80-99%.  Dr. Samule Ohm  conferred with Dr. Arbie Cookey.  The plan was to proceed with carotid  endarterectomy followed at a later date by elective angioplasty of the right  coronary artery.   The patient came today for his elective carotid endarterectomy.  However, he  mentioned that he developed chest pain at rest this morning.  He described  the substernal and right upper sternal discomfort.  This was a pressure type  sensation.  It happened at rest.  It lasted for a couple of minutes before  going away spontaneously.  It was moderate in intensity and similar to his  previous pain.  It was not associated with nausea, vomiting, or diaphoresis.  He does not describe palpitations, presyncope, or syncope.  He had no  objective evidence of ischemia when he presented.  He was placed on  nitroglycerin for the surgery.  He had no complications with the surgery and  currently is  pain free.   PAST MEDICAL HISTORY:  1.  Coronary artery disease, as described.  2.  Ongoing tobacco abuse.  3.  Hypertension.  4.  Reported dyslipidemia (apparently new, though I do not find the lipid      profiles).   PAST SURGICAL HISTORY:  Appendectomy as a child.   SOCIAL HISTORY:  The patient is married.  He has one daughter.  He is trying  to quit smoking, but is currently still smoking two cigarettes per day.  He  has had one pack per day for 35 years.  He works at a plant.  He drinks one  drink at night.   FAMILY HISTORY:  The patient's father died in his 95s of a myocardial  infarction.   REVIEW OF SYSTEMS:  As stated in the HPI, and otherwise negative.   PHYSICAL EXAMINATION:  GENERAL:  The patient is status post carotid  endarterectomy on the left.  He is in no distress.  BLOOD PRESSURE:  103/45.  HEART RATE:  70 and regular.  TEMPERATURE:  Afebrile.  SATURATION:  98% on room air.  HEENT:  Eyes unremarkable.  Pupils equal, round, and reactive to light.  Fundi not visualized.  NECK:  No jugular venous distention.  Waveform within normal limits.  Carotid upstroke on the right brisk.  Positive soft bruit.  No obvious  thyromegaly.  Left carotid bandaged.  LYMPHATIC:  No cervical, axillary, or inguinal adenopathy.  LUNGS:  Clear to auscultation bilaterally anterior.  BACK:  No costovertebral angle tenderness.  CHEST:  Unremarkable.  HEART:  PMI not displaced or sustained.  S1 and S2 within normal limits.  No  S3, no S4.  No murmurs.  ABDOMEN:  Flat.  Positive bowel sounds.  Normal in frequency and pitch.  No  bruits, rebound, guarding, or midline pulsatile mass.  No hepatomegaly, no  splenomegaly.  SKIN:  No rashes, no nodules.  EXTREMITIES:  2+ upper pulses, 2+ femorals, with bilateral bruits, 2+  dorsalis pedis and posterior tibials bilaterally.  No cyanosis, clubbing, no  edema.  NEUROLOGIC:  Oriented to person, place, and time.  Cranial nerves II-XII  grossly  intact.  Motor grossly intact.   Chest x-ray:  Mild peribronchial thickening, mild right hilar fullness.   EKG pending.   LABORATORIES:  Troponin 0.02.  CBC:  Hemoglobin 15.1, WBC 10.1, platelets  290.  Creatinine 0.9, BUN 10, potassium 4.5, glucose 95.   ASSESSMENT AND PLAN:  1.  Chest discomfort.  The patient's chest discomfort may be unstable      angina.  He did have right coronary artery disease as described, with      plan for elective intervention.  Dr. Samule Ohm and Dr. Arbie Cookey have conferred      on the plan.  The patient will be admitted overnight from his carotid      endarterectomy and ruled out for myocardial infarction.  We will not      heparinize him since he is postop.  However, we will watch him on      telemetry.  He will get Plavix on Sunday night prior to planned elective      right coronary intervention.  If he has further discomfort, he will go      more urgently.  2.  Risk reduction.  We will continue to talk to him about the need to stop      smoking.  3.  Medications.  He will continue on his statin.  4.  Hypertension.  Blood pressure will be controlled with medications as      listed.       JH/MEDQ  D:  02/06/2004  T:  02/06/2004  Job:  [id number]   cc:   Marne A. Milinda Antis, M.D. [facility]

## 2010-06-11 NOTE — Cardiovascular Report (Signed)
NAMEDOUGLAS, SMOLINSKY                 ACCOUNT NO.:  [id number]   MEDICAL RECORD NO.:  [id number]          PATIENT TYPE:  INP   LOCATION:                               FACILITY:  [facility]   PHYSICIAN:  Charlies Constable, M.D. [facility] DATE OF BIRTH:  1950/06/20   DATE OF PROCEDURE:  04/07/2004  DATE OF DISCHARGE:                              CARDIAC CATHETERIZATION   CLINICAL HISTORY:  Mr. Crewe is 60 years old and had a previous stenting  in January 2006 to the right coronary artery and developed recurrent  symptoms of angina.  He was admitted to the hospital for catheterization  which was performed on April 07, 2004.   PROCEDURE:  The procedure was performed via the right femoral artery  arterial sheath and 6-French performed coronary catheters.  A front-wall  arterial punch was performed and Omnipaque contrast was used.  After  completion of the diagnostic studies, we made the decision to proceed with  intervention on the diagonal branch of the LAD.  The patient was given  Angiomax bolus and infusion and was given a Plavix load of 300 mg.   We first treated the right coronary artery.  We passed a Prowater wire down  the vessel without difficulty.  We direct stented the proximal edge  restenosis with a 3.0 x 18-mm CYPHER stent performing one inflation at 12  atmospheres for 30 seconds.  We then post dilated with a 3.25 x 50-mm  Quantum Maverick, performing one inflation at 18 atmosphere for 30 seconds.   We then approached the diagonal branch of the LAD.  We first performed IVUS  of the LAD which showed no critical obstruction .  We then used a 2.25 x 6-  mm cutting balloon, performed several inflations of 8 atmospheres for 30  seconds.  This improved the ostial stenosis in the diagonal branch from 90%  to 40%.  Final diagnostic studies were then performed with the guiding  catheter.  The right femoral artery was closed with Angioseal at the end of  the procedure.   RESULTS:  1.  There was  no ____________.  2.  The left anterior descending artery:  The left anterior descending      artery had a 40% ostial and 50% proximal stenosis and another 40%      stenosis in the mid portion.  There was 90% stenosis at the ostium of      the first diagonal branch and 70% stenosis in the mid portion of that      branch.  3.  The circumflex artery:  The circumflex artery arose from a separate      ostium.  The circumflex artery gave rise to a large marginal branch and      a moderate sized marginal branch and two posterolateral branches.  These      vessels were free of significant disease.  4.  The right coronary artery.  The right coronary artery was a moderately      large vessel which gave rise to a right ventricular branch and a      posterior  descending branch.  There was a long stent in the proximal and      mid portion of the right coronary artery.  There was 90% stenosis at he      proximal edge of the stent.   LEFT VENTRICULOGRAM:  Performed in the REO projection showed good wall  motion with no areas of hypokinesis.  The estimated ejection fraction was  60%.   Following stenting of the lesion in the proximal right coronary artery which  was a persistent restenosis, the stenosis improved from 90% to 0%.   Following cutting balloon angioplasty of the ostial lesion in the diagonal  branch of the left anterior descending artery, the stenosis improved from  90% to 40%.   CONCLUSION:  1.  Coronary artery disease, status post prior stenting of the right      coronary artery with 90% restenosis at the proximal edge of the stent in      the right coronary artery, a 40% and 50% proximal stenoses and 40% mid      stenosis in the left anterior descending artery with 90% ostial stenosis      of the first diagonal branch, no significant obstruction of the      circumflex system, and normal left ventricle function with an estimated      ejection fraction of 60%.  2.  Successful  percutaneous coronary intervention for persistent restenosis      using a CYPHER drug-eluting stent in the proximal right coronary artery      with improvement in the percent of narrowing from 90% to 0%.  3.  Successful cutting-balloon angioplasty of the ostial lesion of the first      diagonal branch of the left anterior descending artery with improvement      of the percent diameter narrowing from 90% to 40%.   DISPOSITION:  The patient returned to _____________ for further observation.           ______________________________  Charlies Constable, M.D. [facility]     BB/MEDQ  D:  10/14/2004  T:  10/15/2004  Job:  [id number]

## 2010-06-11 NOTE — Assessment & Plan Note (Signed)
[facility]                              CARDIOLOGY OFFICE NOTE   NAME:Baty, Wyatt Haste                        MRN:          [id number]  DATE:10/24/2005                            DOB:          Nov 18, 1950    IDENTIFICATION:  Mr. Duley is a 60 year old gentleman who was last seen in  cardiology clinic back in December of last year.  He was due in August but  called to reschedule.  The patient has a history of 6 artery disease status  post PTCA/stent to the RCA.  Readmitted in March of 2006 found to have a 90%  proximal LAD lesion and a 90% lesion of the proximal RCA stent.  Underwent  repeat PTCA stent to the RCA and cutting balloon angioplasty to the ostial  diagonal lesion.  Had a Cypher stent placed in the RCA.   Since seeing the patient was doing fairly well several days ago he called to  get Plavix refilled, and he ran out for several days, developed left-sided  chest pain.  This though, he said was pleuritic.  It lasted for several  days.  He got his Plavix filled, and he said it took several days to go  away.  It was somewhat different from his pain before his intervention.   He also notes he stopped his Wellbutrin and felt very down, lethargic,  started back on it, and is feeling better.   He has been discharged from Dr. Bosie Helper followup after endarterectomy last  winter.   CURRENT MEDICATIONS:  1. Aspirin 325 q. day.  2. Toprol XL 25 q. day.  3. Wellbutrin 50 b.i.d.  4. Plavix 75 q. day.  5. Isosorbide 30 q. day.  6. Norvasc 5 q. day.  7. Vytorin 10/40 nightly.   PHYSICAL EXAM:  The patient is in no distress.  Blood pressure 142/96, pulse 70, weight 167.  LUNGS:  Clear.  CARDIAC:  Regular rate and rhythm.  S1, S2, no S3.  No murmurs.  CHEST:  Nontender.  ABDOMEN:  Benign.  EXTREMITIES:  Good pulses.  No edema.   12-lead EKG normal sinus rhythm 70 beats per minute.  Septal infarct  unchanged from previous.   IMPRESSION:  1.  Coronary artery disease, anatomy as noted.  He had a spell of pain when      he ran out of Plavix, but it sounds atypical for angina.  I have      encouraged him, though, to never run out.  If he gets close to running      out he should call and persist at getting samples or something so he      does not have a problem.  I do not think his recent chest pain was      cardiac.  Would follow and keep his current medicines.  2. Dyslipidemia.  Will need to have lipids checked as well as LFTs.  3. Cerebrovascular disease.  Again, medical therapy now.  4. Hypertension.  The patient says it is actually much better at home.  I  told him to get his cuff checked at a fire station and have his blood      pressure checked and keep target on things.  Again, we will need to      follow this up.  If it is running high he should call.  Also, he will      set up an appointment to see Dr. Milinda Antis for health care maintenance      visit.  5. Health care maintenance.  6. Counseled on smoking cessation.   I will set followup for the spring, sooner if problems develop.            ______________________________  Pricilla Riffle, MD, [facility]     PVR/MedQ  DD:  10/24/2005  DT:  10/25/2005  Job #:  [id number]   cc:   Marne A. Milinda Antis, MD

## 2010-06-11 NOTE — Discharge Summary (Signed)
NAMECORDALE, MANERA                 ACCOUNT NO.:  [id number]   MEDICAL RECORD NO.:  [id number]          PATIENT TYPE:  INP   LOCATION:  6522                         FACILITY:  [facility]   PHYSICIAN:  Arturo Morton. Riley Kill, M.D. [facility] OF BIRTH:  [birth year]   DATE OF ADMISSION:  02/19/2004  DATE OF DISCHARGE:  02/21/2004                                 DISCHARGE SUMMARY   ADDENDUM:   BRIEF HISTORY:  This is a 60 year old male who was admitted to [facility] for evaluation of chest pain.  He underwent cardiac catheterization  on February 20, 2004.  He was found to have patent stents.  Arrangements were  made to discharge the patient the following.  Please seen the previously  dictated discharge by Lavella Hammock, P.A.  The patient was noted to have a  right femoral bruit prior to discharge on February 21, 2004.  Arrangements  will be made for him to have an ultrasound in our office on Monday.  He will  follow up with Dr. Tenny Craw on March 05, 2004, at 10:45 a.m.      DR/MEDQ  D:  02/21/2004  T:  02/21/2004  Job:  [id number]   cc:   Marne A. Milinda Antis, M.D. [facility]

## 2010-06-11 NOTE — Cardiovascular Report (Signed)
NAMEJOLLY, BLEICHER                 ACCOUNT NO.:  [id number]   MEDICAL RECORD NO.:  [id number]          PATIENT TYPE:  INP   LOCATION:  6522                         FACILITY:  [facility]   PHYSICIAN:  Carole Binning, M.D. LHCDATE OF BIRTH:  1950-09-15   DATE OF PROCEDURE:  02/20/2004  DATE OF DISCHARGE:                              CARDIAC CATHETERIZATION   PROCEDURE PERFORMED:  Left heart catheterization with coronary angiography  and left ventriculography.   INDICATION:  Mr. Hoyt is a 60 year old male who recently had two stents  placed in his right coronary artery.  He presented to the emergency room  with recurrent substernal chest pain.  He is ruled out for myocardial  infarction and was referred for cardiac catheterization to rule out  progressive or recurrent coronary artery disease.   CATHETERIZATION PROCEDURAL NOTE:  A 6 French sheath was placed in the right  femoral artery.  Coronary angiography was performed with standard Judkins 6  French catheters.  Left ventricular pressures were measured with an angled  pigtail catheter.  Left ventriculography was not performed as the patient  recently had a left ventriculogram performed.  Contrast was Omnipaque.  There were no complications.   CATHETERIZATION RESULTS:   HEMODYNAMICS:  1.  Left ventricular pressure 116/14.  2.  Aortic pressure 120/72.  3.  There is no aortic valve gradient.   CORONARY ARTERIOGRAPHY (CO-DOMINANT):  Left main is absent.   Left anterior descending artery and left circumflex have separate ostia.   Left anterior descending artery has a 50% stenosis in the ostium and a 40%  stenosis in the proximal vessel.  The LAD gives rise to two normal size  diagonal branches.  The first diagonal branch has a tubular 50% stenosis  proximally.   Left circumflex is a large co-dominant vessel.  There is a 30% stenosis in  the proximal circumflex, 20% in the mid circumflex.  The circumflex gives  rise to a small  first obtuse marginal, large second obtuse marginal, small  third obtuse marginal, normal size first posterior lateral branch, small  second and third posterior lateral branches and a normal size fourth  posterior lateral branch.   Right coronary artery is a relatively small co-dominant vessel.  There is  tubular 20% stenosis in the proximal vessel.  Following this, there are two  overlapping stents extending from the proximal into the distal portion of  the mid vessel.  These are widely patent with 0% stenosis within the stents.  The distal right coronary artery ends as a small posterior descending  artery.   IMPRESSION:  1.  Normal left ventricular pressures.  2.  Patent stents in the right coronary artery.  There is residual moderate,      but nonobstructive disease in the left anterior descending artery.   RECOMMENDATIONS:  Medical therapy.  If the patient continues to have chest  pain, would consider a stress nuclear study to rule out ischemia in the left  anterior descending artery distribution.      MWP/MEDQ  D:  02/20/2004  T:  02/20/2004  Job:  [id number]  cc:   Marne A. Milinda Antis, M.D. [facility]

## 2010-06-11 NOTE — H&P (Signed)
NAME:  [name], [name]              ACCOUNT NO.:  [id number]   MEDICAL RECORD NO.:  [id number]          PATIENT TYPE:  OIB   LOCATION:                               FACILITY:  [facility]   PHYSICIAN:  Pricilla Riffle, M.D.    DATE OF BIRTH:  [birth year]   DATE OF ADMISSION:  01/30/2004  DATE OF DISCHARGE:                                HISTORY & PHYSICAL   IDENTIFICATION:  [name] is a 60 year old who was referred to clinic for  evaluation of chest tightness.   HISTORY OF PRESENT ILLNESS:  The patient has no known history of coronary  artery disease.  He notes that last night at about 3 a.m. he woke up with  right-sided pain, lasted a couple of minutes, went back to sleep.  This  morning as he was going to work, he was driving when his right chest began  hurting again.  It was a dull sensation, a pressure sensation, went into his  right arm accompanied by some numbness.  He noted he became sweaty with  this.  It lasted for about 5-6 minutes.  At work he realized it may be  something more to look into and he went on to [name] office.   Two days ago, he had some tingling in his right arm, again kind of a dull  sensation in his chest, not associated with any particular activity.   At work, he takes activity at his own pace, notes no real change in this.  Has no stairs to climb.  Does have a history of heartburn though he says  this is different.  Last night he did have two chili dogs with a glass of  scotch.   PAST MEDICAL HISTORY:  Negative.  The patient has not been seen by a  physician in years.   ALLERGIES:  None.   SOCIAL HISTORY:  The patient is married, works at a plant.  Smokes 35 years,  pack per day, drinks.  Note last night had a scotch to drink.   FAMILY HISTORY:  Father died of an MI in his 45s.   REVIEW OF SYSTEMS:  HEENT:  Negative except for occasional stuffy nose.  CARDIAC:  As noted.  PULMONARY:  Negative.  GI:  As noted.  GU:  Negative.  ENDOCRINE:  No  history of diabetes, cholesterol unknown.  VASCULAR:  Negative.   PHYSICAL EXAMINATION:  GENERAL: On exam, the patient is in no distress.  VITAL SIGNS:  Blood pressure 150/86, pulse is 85, weight 159.  NECK:  Continuous systolic and diastolic murmur heard over the left carotid.  LUNGS:  Clear.  No audible murmurs across the back.  CARDIAC:  Regular rate and rhythm, S1, S2.  No S3, S4, or audible murmurs.  PMI not displaced.  ABDOMEN:  Benign.  EXTREMITIES:  Good dorsalis pedis pulses.  No lower extremity edema.   A 12-lead EKG (taken at [name] office this morning) shows normal sinus  rhythm at a rate of 93 beats per minute.  Nonspecific ST-T wave changes.  Note Q waves in V1, V2, V3  consistent with anterior MI.   IMPRESSION:  [name] is a 60 year old gentleman with several risk factors  for artery disease.  Cholesterol is unknown.  He has not sought much medical  attention in years.  On examination, he has a carotid murmur with systolic  and diastolic components.  I went ahead and scheduled carotid Dopplers and  they showed bilateral severe ICA stenoses (80-99% bilaterally) but the  patient denied any TIA-like symptoms.   I am concerned about the patient's episode of chest pain.  It is different  from his indigestion in the past and I think with his carotid disease he  should have this looked into.  I recommended him staying for a  catheterization in the morning.  He understands the risks and has not wanted  to stay overnight at [facility], but would like to go home and settle his affairs,  come in the morning.   Note when the patient was having his carotids done, a quick look  echocardiogram at the LV showed normal LV function.   Risks/benefits of the heart catheterization were described.  The patient  initially agreed to proceed.  Note while he was waiting for blood work to be  drawn, however, he left without telling anyone.  We will try to contact the  patient tonight to plan  for continued catheterization tomorrow.  He was told  to take a baby aspirin prior to his leaving and a prescription for  sublingual nitroglycerin was given.        ___________________________________________  Pricilla Riffle, M.D.    PVR/MEDQ  D:  01/29/2004  T:  01/29/2004  Job:  [id number]   cc:   [name], M.D. [facility]

## 2010-06-11 NOTE — Op Note (Signed)
NAMEJAREK, [name]              ACCOUNT NO.:  [id number]   MEDICAL RECORD NO.:  [id number]          PATIENT TYPE:  INP   LOCATION:  2899                         FACILITY:  [facility]   PHYSICIAN:  Larina Earthly, M.D.    DATE OF BIRTH:  [birth year]   DATE OF PROCEDURE:  02/06/2004  DATE OF DISCHARGE:                                 OPERATIVE REPORT   PREOPERATIVE DIAGNOSIS:  Severe bilateral internal carotid artery and  coronary artery disease.   POSTOPERATIVE DIAGNOSIS:  Severe bilateral internal carotid artery and  coronary artery disease.   PROCEDURE:  Left carotid endarterectomy and Dacron patch angioplasty.   SURGEON:  Larina Earthly, M.D.   ASSISTANTS:  Quita Skye. Hart Rochester, M.D., and Rowe Clack, P.A.-C.   ANESTHESIA:  General endotracheal.   COMPLICATIONS:  None.   DISPOSITION:  To recovery room stable.   INDICATION FOR PROCEDURE:  The patient is a 60 year old gentleman who  proceeded with an episode of chest pain and also with right arm numbness.  A  workup included a carotid duplex revealing bilateral carotid stenosis.  The  patient also underwent cardiac catheterization, which revealed a significant  right coronary artery stenosis.  Discussion with the patient, cardiology and  vascular surgery involved timing of interventions.  It was felt with  critical bilateral severe stenosis and with the right coronary with a small  distribution of flow that it would be safe to proceed with endarterectomy  first and then proceed with coronary intervention.   PROCEDURE IN DETAIL:  The patient was taken to the operating room and placed  in the supine position, where the area of the left neck was prepped and  draped in the usual sterile fashion.  An incision made anterior to the  sternocleidomastoid and carried down through platysma with electrocautery.  The sternocleidomastoid reflected posteriorly and the carotid sheath was  opened.  The facial vein was ligated with 3-0 silk ties  and divided.  The  common carotid artery was encircled with an umbilical tape and Rumel  tourniquet.  Dissection was carried onto the bifurcation.  The external  carotid was encircled with a blue vessel loop.  The internal carotid was  encircled with an umbilical tape and Rumel tourniquet.  The vagus and  hypoglossal nerves were identified and preserved.  The patient was given  7000 units of intravenous heparin.  After adequate circulation time, the  internal, external, and common carotid arteries were occluded.  The common  carotid artery was opened with an 11 blade, extended longitudinally with  Potts scissors through the plaque onto the internal carotid.  A 10 shunt was  passed up the internal carotid, allowed to backbleed, then down the common  carotid artery, where it was secured with Rumel tourniquets.  The  endarterectomy was begun on the common carotid artery, and the plaque was  divided proximally with Potts scissors.  The endarterectomy was carried onto  the bifurcation.  The external carotid was endarterectomized with eversion  technique, and the internal carotid was endarterectomized in an open  fashion.  The remaining atheromatous debris was removed  from the  endarterectomy plane.  The Dacron Finesse patch was brought onto the field  and was sewn as a patch angioplasty with a running 6-0 Prolene suture.  Prior to completion of the anastomosis, the shunt was removed and the usual  flushing maneuvers were undertaken.  The anastomosis was then completed and  the external, followed by the common and finally the internal carotid  occlusion clamp was removed.  Excellent flow characteristics were noted with  hand-held Doppler in the internal and external carotid arteries.  The  patient was given 50 mg of protamine to reverse the heparin.  The wounds  were irrigated with saline, hemostasis with electrocautery.  The wounds were  closed with 3-0 Vicryl to reapproximate the  sternocleidomastoid over the  carotid sheath.  Next the platysma was closed in a running 3-0 Vicryl suture  and finally the skin was closed with a 4-0 subcuticular Vicryl stitch.  A  sterile dressing was applied and the patient was taken to the recovery room  in stable condition.                                               ______________________________  Larina Earthly, M.D.  Electronically Signed 02/16/2004 01:57:28pm EST    TFE/MEDQ  D:  02/06/2004  T:  02/06/2004  Job:  [id number]

## 2010-06-11 NOTE — Discharge Summary (Signed)
[name], [name]                 ACCOUNT NO.:  [id number]   MEDICAL RECORD NO.:  [id number]          PATIENT TYPE:  INP   LOCATION:  6522                         FACILITY:  [facility]   PHYSICIAN:  Pricilla Riffle, M.D.    DATE OF BIRTH:  [birth year]   DATE OF ADMISSION:  02/19/2004  DATE OF DISCHARGE:  02/21/2004                                 DISCHARGE SUMMARY   PROCEDURE:  1.  Cardiac catheterization.  2.  Coronary arteriogram.  3.  Left ventriculogram.   HOSPITAL COURSE:  Mr. Poitras is a 60 year old male with a history of  coronary artery disease.  He was recently hospitalized and had a stent to  his RCA as well as left carotid endarterectomy.  He was discharged on  February 10, 2004, and had been on Plavix, aspirin, and Lipitor since  discharge.  He was seen by Dr. Arbie Cookey and complained of chest pain.  Dr.  Arbie Cookey sent him by EMS to the emergency room for further evaluation and  treatment.  Mr. Carchi was admitted for chest pain.  His cardiac enzymes  were negative.  He continued to have chest pain that was moderately well  controlled by narcotic medications but he was never pain free.  Cardiac  catheterization was performed on February 20, 2004.  The cardiac  catheterization showed no in stent restenosis.  He had residual coronary  artery disease from 20-50% in multiple vessels that was unchanged.  He had  normal left ventricular  pressures.  Dr. Gerri Spore evaluated the films and  felt that medical therapy was recommended.  He did have a 50% diagonal and  Dr. Gerri Spore said that a Cardiolite could be performed for recurrent  symptoms.  Post cath, Mr. Inclan was without chest pain or shortness of  breath.  His groin is without oozing or hematoma.  If he does well  overnight, he is tentatively considered stable for discharge on February 21, 2004.   DISCHARGE DIAGNOSIS:   DISCHARGE DIAGNOSIS:  1.  Chest pain, no critical stenosis by cath this admission.  2.  Status post percutaneous  transluminal coronary angiography and drug      eluting stent to the right coronary artery on February 09, 2004.  3.  Preserved left ventricular  function by cath this admission.  4.  Status post left carotid endarterectomy on February 06, 2004.  5.  Hypertension.  6.  Hyperlipidemia.  7.  Right carotid stenosis with 50-80%, follow up with CVTS.   DISCHARGE INSTRUCTIONS:  His activity level is to include no strenuous  activities for two days and light duty for a week.  He is to stay on a low  fat diet.  He is to call the office for problems with the cath site.  He is  not to use tobacco.  He has follow up appointment with Dr. Tenny Craw on February  10 at 10:45 and is to follow up with Dr. Milinda Antis and Dr. Arbie Cookey as needed or as  scheduled.   DISCHARGE MEDICATIONS:  1.  Aspirin 325 mg daily.  2.  Plavix 75  mg daily.  3.  Lipitor 80 mg daily.  4.  Toprol XL 25 mg daily.  5.  Sublingual nitroglycerin p.r.n.  6.  Wellbutrin 150 mg p.o. b.i.d.      RB/MEDQ  D:  02/20/2004  T:  02/21/2004  Job:  [id number]   cc:   Larina Earthly, M.D.  [address]  [place]  [place] [address]   Idamae Schuller A. Milinda Antis, M.D. [facility]

## 2010-06-11 NOTE — Discharge Summary (Signed)
NAMEJONTA, GASTINEAU              ACCOUNT NO.:  [id number]   MEDICAL RECORD NO.:  [id number]          PATIENT TYPE:  OIB   LOCATION:  3734                         FACILITY:  [facility]   PHYSICIAN:  Theodore Demark, P.A. LHCDATE OF BIRTH:  December 22, 1950   DATE OF ADMISSION:  01/30/2004  DATE OF DISCHARGE:  02/01/2004                                 DISCHARGE SUMMARY   PROCEDURES:  1.  Cardiac catheterization.  2.  Coronary arteriogram.  3.  Left ventriculogram.   IMAGING PROCEDURES:  Computerized tomography of chest.   HOSPITAL COURSE:  Mr. Dillinger is a 60 year old male with no known history of  coronary artery disease.  He had some chest pain problems and was seen by  Dr. Roxy Manns, who referred him to Dr. Dietrich Pates.  Dr. Tenny Craw felt that his  pain was concerning for unstable angina and recommended that he stay in the  hospital for cath.  The patient refused to stay in the hospital but stated  he would come back for an outpatient catheterization.  He returned for cath  on January 30, 2004.   Cardiac catheterization showed a 50% proximal LAD lesion, 80% diagonal  proximal and distal disease with 70% stenosis of a diagonal side branch.  The circumflex had 40% stenosis in a branch of the OM, and the RCA had  multiple lesions of between 50% and 80%.  His left ventriculogram was  normal.  He had undergone carotid Dopplers performed which showed 80% to 99%  bilateral ICA stenoses.  It was felt that the carotid lesions should take  precedence over the cardiac lesions and CVTS was called.   Dr. Arbie Cookey evaluated Mr. Gomes and an MRA of the brain was performed with  angiography.  It also showed severe bilateral internal carotid artery  stenoses greater than 70% on each side and possibly as great as 90% or more.  He also had some stenosis of the proximal vertebral arteries which were  difficult to grade but did not appear severe.  The intracranial vessels were  intrinsically normal.  Because of  carotid bifurcation disease, however,  inflow to the right internal carotid artery was diminished compared to the  left.  Dr. Arbie Cookey felt that the patient needed CEA, but felt that this could  be performed as an outpatient.  He is scheduled to return to the hospital on  January 10 for this.   As part of his initial evaluation, Mr. Maya had a chest x-ray.  There was  concerned for right hilar fullness, so CT was performed.  There was no  mediastinal, hilar or axillary adenopathy.  Emphysematous changes were  identified, especially at the apices, but no focal consolidations or  effusions.  The appearance of the right hilum was normal by CT.   Mr. Gullion has a long history of tobacco use, and wanted to quit smoking.  He was given a prescription for Wellbutrin.  His wife states that she will  also quit smoking and try to keep him from smoking, as well.   As part of his evaluation, he had a lipid profile performed.  The lipid  profile showed a total cholesterol of 237, triglycerides 166, HDL 42, LDL  162.  He had Lipitor 80 mg a day added to his medication regimen, and is to  follow up with liver and cholesterol profiles in 6-12 weeks.   By February 01, 2004 Mr. Fickle was ambulating without chest pain or  shortness of breath.  He was evaluated by Dr. Graciela Husbands and considered stable  for discharge, and is to follow up as an outpatient on Tuesday for a CEA and  then with cardiology.   DISCHARGE DIAGNOSES:  1.  Anginal pain, enzymes negative for myocardial infarction, follow up with      cardiology for possible percutaneous intervention after treatment for      vascular disease.  2.  Peripheral vascular disease, bilateral internal carotid artery stenoses,      cerebrovascular accident per cardiothoracic surgery.  3.  Tobacco use.  4.  Family history of myocardial infarction in his father.  5.  Hyperlipidemia.  6.  Hypertension.   ACTIVITY:  His activity level is to include no strenuous  activity.   DIET:  He is to stick to a low fat diet.   DISCHARGE INSTRUCTIONS:  He is not to use tobacco.  He is to call the office  for problems with the cath site.   FOLLOWUP:  He is to follow up with Dr. Tenny Craw and the office will call, and he  is to return to the hospital on Tuesday as directed by Dr. Arbie Cookey.   DISCHARGE MEDICATIONS:  1.  Coated aspirin 325 mg daily.  2.  Lipitor 80 mg daily.  3.  Toprol XL 25 mg daily.  4.  Nitroglycerin sublingually p.r.n.  5.  Benadryl 25 mg at bedtime as needed.  6.  Wellbutrin 150 mg daily for three days, then b.i.d.       RB/MEDQ  D:  02/01/2004  T:  02/01/2004  Job:  [id number]   cc:   Marne A. Milinda Antis, M.D. [facility]   Larina Earthly, M.D.  [address]  [place]  [place] [address]

## 2010-06-11 NOTE — H&P (Signed)
**Note Omar-Identified via Obfuscation** NAMEDEVYON, [name]              ACCOUNT NO.:  [id number]   MEDICAL RECORD NO.:  [id number]           PATIENT TYPE:   LOCATION:                                 FACILITY:   PHYSICIAN:  Larina Earthly, M.D.    DATE OF BIRTH:  Jul 13, 1950   DATE OF ADMISSION:  03/10/2005  DATE OF DISCHARGE:                                HISTORY & PHYSICAL   REASON FOR ADMISSION:  Asymptomatic right carotid stenosis.   HISTORY OF PRESENT ILLNESS:  The patient is a 60 year old white male who was  found approximately 1 year ago during a cardiac workup to have bilateral  carotid stenoses. He was asymptomatic at that time and underwent a left  carotid endarterectomy on February 06, 2004, performed by Dr. Arbie Cookey. He has  since recovered without any problem and is doing well. He returned for  followup recently, and duplex scan shows progression of his right internal  carotid artery stenosis, now to 80-99%. He continues to remain asymptomatic,  although he continues to experience right arm tingling which was his initial  presenting symptom. Also he recently experienced some right eye transient  loss of vision which he had attributed to discontinuation of a medication.  This resolved without any problem, and he has had no other visual changes  since then. Dr. Arbie Cookey recommended proceeding with a right carotid  endarterectomy at this time in order to decrease his risk of stroke. He  otherwise denies TIA symptoms, amaurosis fugax, dysphagia, dysarthria,  syncope, muscle weakness, or other neurological symptoms.   PAST MEDICAL HISTORY:  1.  Coronary artery disease status post PTCA and stent placement in January      1996.  2.  Extracranial cerebrovascular occlusive disease.  3.  Dyslipidemia.   PAST SURGICAL HISTORY:  1.  Left carotid endarterectomy in January 2006 by Dr. Arbie Cookey.  2.  Appendectomy as a teenager.   ALLERGIES:  No known drug allergies.   MEDICATIONS:  1.  Metoprolol 25 mg daily  2.  Norvasc 5  mg daily.  3.  Wellbutrin 150 mg twice daily.  4.  Plavix 75 mg daily, last dose on March 08, 2005.  5.  Nasonex daily.  6.  Vitorin 10/40 mg daily.  7.  Isosorbide 30 mg daily.  8.  Aspirin 325 mg daily.  9.  Sublingual nitroglycerin p.r.n.   SOCIAL HISTORY:  Is married and has one child. He resides in [place]. He is  employed by [employer] as a Production designer, theatre/television/film. He previously smoked a pack of  cigarettes per day but now has cut down to a pack every 3-4 days. He has  smoked for 40+ years. He consumes alcohol, usually around a six-pack of beer  per week and a glass of wine every 2-3 days.   FAMILY HISTORY:  His mother is living and has Alzheimer's. Father died at  age 91 of myocardial infarction. He has four brothers and one sister, all of  whom are alive and well.   REVIEW OF SYSTEMS:  See History of Present Illness for pertinent positives  and negatives. Otherwise, he does  continue have occasional chest pain with  extreme exertion and takes sublingual nitroglycerin p.r.n.. He has problems  with sinus congestion and has been using Nasonex recently. He denies fevers,  chills, weight loss, recent infections, TIA symptoms or neurological changes  as above, heart palpitations, shortness of breath, dyspnea on exertion, PND,  orthopnea, hemoptysis, cough, wheezing, abdominal pain, nausea, vomiting,  diarrhea, constipation, reflux symptoms, hematochezia, melena, hematuria,  dysuria or nocturia, lower extremity claudication symptoms or rest pain,  nonhealing lower extremity ulcers, intolerance to heat or cold, muscle or  joint pain or weakness, anxiety or depression.   PHYSICAL EXAMINATION:  VITAL SIGNS: Blood pressure 120/72, heart rate 72 and  regular, respirations 20 and unlabored.  GENERAL: This is a well-developed, well-nourished white male in no acute  distress.  HEENT: Normocephalic, atraumatic. Pupils equal, round and react to light  accommodation. Extraocular movements intact. TMs  and canals are clear.  Oropharynx is clear, and dentition is in fair repair.  NECK: Supple without lymphadenopathy or thyromegaly. He has a well-healed  left carotid endarterectomy scar and a harsh right carotid bruit.  LUNGS: Clear to auscultation.  ABDOMEN: Soft, nontender, nondistended with active bowel sounds in all  quadrants. No masses or hepatosplenomegaly.  HEART:  Regular rate and rhythm without murmurs, rubs or gallops  EXTREMITIES: No clubbing, cyanosis or edema. He has 2+ femoral, dorsalis  pedis and posterior tibial pulses bilaterally.  NEUROLOGIC: Cranial nerves II-XII grossly intact. He is alert and oriented  x3. His gait is within normal limits. He has 5+ upper and lower extremity  strength.   ASSESSMENT/PLAN:  This is a 60 year old white male with asymptomatic right  internal carotid artery stenosis. He will be admitted to [facility]  on March 11, 1995, and undergo a right carotid endarterectomy by Dr.  Arbie Cookey.      Coral Ceo, P.A.      Larina Earthly, M.D.  Electronically Signed    GC/MEDQ  D:  03/08/2005  T:  03/08/2005  Job:  [id number]   cc:   Marne A. Tower, M.D. [facility]  [address] [place]  [place] [address]   Pricilla Riffle, M.D.  [address]  [place]  [place] [address]

## 2010-06-11 NOTE — Discharge Summary (Signed)
NAMEGARLEN, [name]              ACCOUNT NO.:  [id number]   MEDICAL RECORD NO.:  [id number]          PATIENT TYPE:  INP   LOCATION:  2041                         FACILITY:  [facility]   PHYSICIAN:  Larina Earthly, M.D.    DATE OF BIRTH:  28-Mar-1950   DATE OF ADMISSION:  02/06/2004  DATE OF DISCHARGE:  02/10/2004                                 DISCHARGE SUMMARY   HISTORY OF PRESENT ILLNESS:  This is a 60 year old male who was hospitalized  1 week prior to this admission after an episode of chest pain.  He was found  to have significant right coronary artery disease as well as bilateral  severe extracranial cerebrovascular occlusive disease.  Vascular surgical  opinion was obtained with Gretta Began, M.D. who evaluated the patient and  studies, and recommended proceeding with a left carotid endarterectomy and  he was admitted this hospitalization for the procedure.   PAST MEDICAL HISTORY:  1.  Carotid artery disease.  2.  Questionable lipid status.  3.  Coronary artery disease.   PAST SURGICAL HISTORY:  Appendectomy at age 34.   MEDICATIONS PRIOR TO ADMISSION:  1.  Aspirin 325 mg daily.  2.  Lipitor 80 mg daily.  3.  Toprol XL 25 mg daily.  4.  Nitroglycerin 0.4 mg p.r.n.  5.  Wellbutrin 150 mg b.i.d., however, he was not taking.   ALLERGIES:  NONE.   FAMILY HISTORY, SOCIAL HISTORY, REVIEW OF SYSTEMS, AND PHYSICAL EXAM:  Please see the history and physical done at the time of admission.   HOSPITAL COURSE:  The patient was admitted for the left carotid  endarterectomy.  The patient was taken to the operating room and on January  13. 2006 underwent a left carotid endarterectomy with Dacron patch  angioplasty.  The patient tolerated the procedure well and was taken to the  post anesthesia care unit in stable condition.  Of note, the patient did  develop some chest pain prior to the surgery.  He was stabilized medically  and it was felt from both anesthesia and cardiology report that  the  procedure could proceed.  It was Dr. Melinda Crutch opinion that the patient was a  candidate for right coronary artery intervention which could be done after a  few days of recovery from the surgery.  The patient has remained  neurologically intact.  His incision is healing without signs of infection.  He has remained hemodynamically stable.  He was followed postoperatively by  the cardiologist and it was felt safe to proceed on February 09, 2004 with  the following procedure:  Coronary angiogram with stent placement to the  right coronary artery.  This procedure was performed by Dr. Samule Ohm, the  patient tolerated the procedure well.  The 70% lesion in the right coronary  artery was noted to be 0% following the procedure.  Overall, he was felt to  be quite stable for discharge on February 10, 2004.   CONDITION ON DISCHARGE:  Stable and improved.   FINAL DIAGNOSES:  1.  Severe extracranial cerebrovascular occlusive disease bilaterally, now      status  post left carotid endarterectomy.  Timing of right carotid      endarterectomy to be determined.  2.  History of coronary artery disease, now status post stenting of the      right coronary artery as described.  The stent in a drug-eluting stent      and there were two placed with excellent angiographic result.   DISCHARGE MEDICATIONS:  1.  Plavix recommended for at least 1 year.  2.  Adult strength aspirin daily.  3.  Lipitor 80 mg daily.  4.  Toprol XL 25 mg daily.  5.  Nitroglycerin p.r.n.   DISCHARGE INSTRUCTIONS:  The patient received written instructions in regard  to medications, activity, diet, wound care, and followup.  Followup will  include Dr. Tenny Craw on March 05, 2004 at 10:45 a.m. and Dr. Arbie Cookey, Thursday,  January 26 at 10:30 a.m.      WEG/MEDQ  D:  02/10/2004  T:  02/10/2004  Job:  [id number]   cc:   Salvadore Farber, M.D. [facility]  [address]  [address]  [place]  [place] [address]

## 2010-06-11 NOTE — Op Note (Signed)
NAMECHRISTAIN, [name]                 ACCOUNT NO.:  [id number]   MEDICAL RECORD NO.:  [id number]          PATIENT TYPE:  INP   LOCATION:  2899                         FACILITY:  [facility]   PHYSICIAN:  Larina Earthly, M.D.    DATE OF BIRTH:  1950-02-07   DATE OF PROCEDURE:  03/10/2005  DATE OF DISCHARGE:                                 OPERATIVE REPORT   PREOPERATIVE DIAGNOSIS:  Severe asymptomatic right internal carotid artery  stenosis.   POSTOPERATIVE DIAGNOSIS:  Severe asymptomatic right internal carotid artery  stenosis.   PROCEDURE:  Right carotid endarterectomy, Dacron patch angioplasty.   SURGEON:  Larina Earthly, M.D.   ASSISTANT:  Coral Ceo, PA-C.   ANESTHESIA:  General endotracheal.   COMPLICATIONS:  None.   DISPOSITION:  To recovery room stable.   PROCEDURE IN DETAIL:  The patient was taken to the operating room and placed  in the supine position, where the area of the right neck was prepped and  draped in the usual sterile fashion.  Incision was made anterior to the  sternocleidomastoid and carried through the platysma with electrocautery.  The sternocleidomastoid was reflected posteriorly and the carotid sheath was  opened.  The facial vein was ligated and divided.  The common carotid artery  was encircled with an umbilical tape Rumel tourniquet.  The vagus and  hypoglossal nerves were identified and preserved.  Dissection was continued  on to the bifurcation.  The superior thyroid artery was encircled with a 2-0  silk Potts tie, external carotid was encircled with a blue vessel loops, the  internal carotid was encircled with an umbilical tape Rumel tourniquet.  The  patient had a small internal carotid artery.  The patient was given 7000  units of intravenous heparin and after an adequate circulation time, the  internal, external and common carotid arteries were occluded.  The common  carotid artery was opened with an 11 blade, extended longitudinally with  Potts  scissors through the plaque onto the internal carotid with Potts  scissors.  An 8 shunt was passed up the internal carotid artery, allowed to  back bleed, then down the common carotid, where it was secured with Rumel  tourniquets.  The endarterectomy was begun on the common carotid artery, and  the plaque was divided proximally with Potts scissors.  The endarterectomy  was continued onto the bifurcation.  The external carotid was external  carotid was endarterectomized with eversion technique and the internal  carotid was endarterectomized in an open fashion.  The remaining  atheromatous debris was removed from the endarterectomy plane.  A Finesse  Hemashield Dacron patch was brought onto the field and was sewn as a patch  angioplasty with a running 6-0 Prolene suture.  Prior to completion of the  anastomosis, the shunt was removed and the usual flush maneuvers were  undertaken.  The anastomosis was completed and flow was restored first the  external, then the internal carotid arteries.  Several additional sutures  were required for hemostasis.  The wounds were irrigated with saline and  hemostasis with electrocautery.  The wounds  were closed with 3-0 Vicryl to  reapproximate the sternocleidomastoid over the carotid  sheath.  Next the platysma was closed with a running 3-0 Vicryl suture, and  finally the skin was closed with a 4-0 subcuticular Vicryl stitch.  A  sterile dressing was applied and the patient was taken to recovery room in  stable condition.      Larina Earthly, M.D.  Electronically Signed     TFE/MEDQ  D:  03/10/2005  T:  03/10/2005  Job:  [id number]

## 2010-06-17 ENCOUNTER — Telehealth: Payer: Self-pay | Admitting: Internal Medicine

## 2010-06-17 MED ORDER — ROSUVASTATIN CALCIUM 40 MG PO TABS: 40.0000 mg | ORAL_TABLET | Freq: Every day | ORAL | Status: DC

## 2010-06-17 NOTE — Telephone Encounter (Signed)
Pt has called 3 times for his crestor refill to be [facility] # [phone]

## 2010-06-17 NOTE — Telephone Encounter (Signed)
rx sent in to pharmacy. Pt notified

## 2010-06-18 ENCOUNTER — Other Ambulatory Visit: Payer: Self-pay | Admitting: *Deleted

## 2010-06-18 MED ORDER — ROSUVASTATIN CALCIUM 40 MG PO TABS: 40.0000 mg | ORAL_TABLET | Freq: Every day | ORAL | Status: DC

## 2010-06-19 ENCOUNTER — Telehealth: Payer: Self-pay | Admitting: *Deleted

## 2010-06-22 ENCOUNTER — Other Ambulatory Visit: Payer: Self-pay | Admitting: *Deleted

## 2010-06-22 DIAGNOSIS — E782 Mixed hyperlipidemia: Secondary | ICD-10-CM

## 2010-06-22 MED ORDER — ROSUVASTATIN CALCIUM 40 MG PO TABS: 40.0000 mg | ORAL_TABLET | Freq: Every day | ORAL | Status: DC

## 2010-07-01 NOTE — Telephone Encounter (Signed)
Done

## 2010-08-11 ENCOUNTER — Other Ambulatory Visit: Payer: Self-pay | Admitting: *Deleted

## 2010-08-11 MED ORDER — METOPROLOL SUCCINATE ER 25 MG PO TB24: 25.0000 mg | ORAL_TABLET | Freq: Every day | ORAL | Status: DC

## 2010-09-11 ENCOUNTER — Other Ambulatory Visit: Payer: Self-pay | Admitting: Internal Medicine

## 2011-03-17 ENCOUNTER — Other Ambulatory Visit: Payer: Self-pay

## 2011-03-17 MED ORDER — METOPROLOL SUCCINATE ER 25 MG PO TB24: 25.0000 mg | ORAL_TABLET | Freq: Every day | ORAL | Status: DC

## 2011-03-17 NOTE — Telephone Encounter (Signed)
..   Requested Prescriptions   Signed Prescriptions Disp Refills  . metoprolol succinate (TOPROL-XL) 25 MG 24 hr tablet 30 tablet 1    Sig: Take 1 tablet (25 mg total) by mouth daily.    Authorizing Provider: Pricilla Riffle    Ordering User: Christella Hartigan, Dinero Chavira Judie Petit

## 2011-03-29 ENCOUNTER — Other Ambulatory Visit: Payer: Self-pay

## 2011-03-29 DIAGNOSIS — E782 Mixed hyperlipidemia: Secondary | ICD-10-CM

## 2011-03-29 MED ORDER — ROSUVASTATIN CALCIUM 40 MG PO TABS: 40.0000 mg | ORAL_TABLET | Freq: Every day | ORAL | Status: DC

## 2011-04-16 ENCOUNTER — Other Ambulatory Visit: Payer: Self-pay | Admitting: Internal Medicine

## 2011-05-08 ENCOUNTER — Other Ambulatory Visit: Payer: Self-pay | Admitting: Internal Medicine

## 2011-05-20 ENCOUNTER — Other Ambulatory Visit: Payer: Self-pay | Admitting: Internal Medicine

## 2011-06-06 ENCOUNTER — Encounter: Payer: Self-pay | Admitting: Internal Medicine

## 2011-06-06 ENCOUNTER — Ambulatory Visit (INDEPENDENT_AMBULATORY_CARE_PROVIDER_SITE_OTHER): Payer: BC Managed Care – PPO | Admitting: Internal Medicine

## 2011-06-06 VITALS — BP 134/93 | HR 72 | Ht 72.0 in | Wt 174.0 lb

## 2011-06-06 DIAGNOSIS — I2510 Atherosclerotic heart disease of native coronary artery without angina pectoris: Secondary | ICD-10-CM

## 2011-06-06 NOTE — Progress Notes (Signed)
HPI Patient is a 61 year old with a history of CAD (s/p PTCA/stent of the RCA, cutting angioplasty of the LAD in 2006), CV dz (s/p bilateral CEA) and dyslipidemia. I last saw him in April of last year.  Since seen he has done well from a cardiac standpoint.  He denies CP.  Breathing is OK Under increased stress.  Wife has cancer.  Mother-in-law living with them  No Known Allergies  Current Outpatient Prescriptions  Medication Sig Dispense Refill  . amLODipine (NORVASC) 5 MG tablet TAKE 1 TABLET BY MOUTH EVERY DAY  30 tablet  6  . aspirin 81 MG tablet Take 81 mg by mouth daily.        . isosorbide mononitrate (IMDUR) 30 MG 24 hr tablet TAKE 1 TABLET BY MOUTH EVERY DAY  30 tablet  11  . metoprolol succinate (TOPROL-XL) 25 MG 24 hr tablet TAKE 1 TABLET BY MOUTH EVERY DAY  30 tablet  1  . rosuvastatin (CRESTOR) 40 MG tablet Take 1 tablet (40 mg total) by mouth daily.  30 tablet  6    Past Medical History  Diagnosis Date  . Dyslipidemia   . Hypertension   . Cerebrovascular disease   . Coronary artery disease   . Pain in joint, shoulder region     Past Surgical History  Procedure Date  . Ptca     stent placement January 2006  . Carotid endarterectomy     left/Jan. 2006/ right;Dacron patch angioplasty    Family History  Problem Relation Age of Onset  . Alzheimer's disease Mother   . Heart disease Father     History   Social History  . Marital Status: Married    Spouse Name: N/A    Number of Children: N/A  . Years of Education: N/A   Occupational History  . Production designer, theatre/television/film     [employer]   Social History Main Topics  . Smoking status: Former Smoker    Types: Cigarettes    Quit date: 01/25/2011  . Smokeless tobacco: Not on file   Comment: Smokes electronic ciggarettes  . Alcohol Use: Yes  . Drug Use: Not on file  . Sexually Active: Not on file   Other Topics Concern  . Not on file   Social History Narrative  . No narrative on file    Review of Systems:  All systems  reviewed.  They are negative to the above problem except as previously stated.  Vital Signs: BP 134/93  Pulse 72  Ht 6' (1.829 m)  Wt 174 lb (78.926 kg)  BMI 23.60 kg/m2  Physical Exam Patient is in NAD HEENT:  Normocephalic, atraumatic. EOMI, PERRLA.  Neck: JVP is normal. No thyromegaly. No bruits.  Lungs: clear to auscultation. No rales no wheezes.  Heart: Regular rate and rhythm. Normal S1, S2. No S3.   No significant murmurs. PMI not displaced.  Abdomen:  Supple, nontender. Normal bowel sounds. No masses. No hepatomegaly.  Extremities:   Good distal pulses throughout. No lower extremity edema.  Musculoskeletal :moving all extremities.  Neuro:   alert and oriented x3.  CN II-XII grossly intact.  EKG:  SR  72.  Occasional PVC  Assessment and Plan: 1.  CAD.  No symptoms of angina.  Keep on same regimen 2.  CV disease  Keep on medical Rx. 3.  HTN  Not optimally controlled  Did race in from wifes visit in [place].  Follow 4.  Dyslipidemia.  Keep on crestor.

## 2011-06-06 NOTE — Patient Instructions (Signed)
Your physician wants you to follow-up in: 12 months You will receive a reminder letter in the mail two months in advance. If you don't receive a letter, please call our office to schedule the follow-up appointment.  Fasting lab work at primary care doctor's office.

## 2011-07-21 ENCOUNTER — Other Ambulatory Visit: Payer: Self-pay | Admitting: Internal Medicine

## 2011-07-21 ENCOUNTER — Other Ambulatory Visit: Payer: Self-pay | Admitting: *Deleted

## 2011-07-21 MED ORDER — METOPROLOL SUCCINATE ER 25 MG PO TB24: 25.0000 mg | ORAL_TABLET | Freq: Every day | ORAL | Status: DC

## 2011-07-21 NOTE — Telephone Encounter (Signed)
Fax Received. Refill Completed. Phill Steck Chowoe (R.M.A)   

## 2011-07-21 NOTE — Telephone Encounter (Signed)
Fax Received. Refill Completed. [name] (R.M.A)   

## 2011-10-26 ENCOUNTER — Telehealth: Payer: Self-pay | Admitting: Internal Medicine

## 2011-10-26 DIAGNOSIS — E785 Hyperlipidemia, unspecified: Secondary | ICD-10-CM

## 2011-10-26 NOTE — Telephone Encounter (Signed)
Spoke with pt who has recently lost insurance and cost of Crestor will be $250 per month. He is asking if he can be changed to a cheaper alternative. He states he was on different cholesterol med in past but cannot remember name.  Old notes reviewed and it appears he has been on Crestor since 2009.  Office visit note from 2008 states he has been on Vytorin and Lipitor in past.  I told pt I would leave samples of Crestor at desk and send message to Dr. Tenny Craw regarding alternative.  Crestor 20 mg- 28 tablets- Lot BL 5043, exp 4/15 left at front desk. Pt aware to take 2 tabs daily to total 40 mg.

## 2011-10-26 NOTE — Telephone Encounter (Signed)
New problem:  Discuss changing Crestor Due to loss of insurance or sample of medication.

## 2011-10-27 MED ORDER — ATORVASTATIN CALCIUM 80 MG PO TABS: 80.0000 mg | ORAL_TABLET | Freq: Every day | ORAL | Status: DC

## 2011-10-27 NOTE — Telephone Encounter (Signed)
I spoke with wife today and she would like Lipitor sent in for pt. Aware he will need fasting lipid & AST in 8 weeks. Order sent.

## 2011-10-27 NOTE — Telephone Encounter (Signed)
When run out can try lipitor 80  May not be adequate.  [place]elt lipid panel and AST checked 8 wks after starting lipitor.

## 2011-11-27 ENCOUNTER — Other Ambulatory Visit: Payer: Self-pay | Admitting: Internal Medicine

## 2011-12-29 ENCOUNTER — Other Ambulatory Visit (INDEPENDENT_AMBULATORY_CARE_PROVIDER_SITE_OTHER): Payer: Self-pay

## 2011-12-29 DIAGNOSIS — E785 Hyperlipidemia, unspecified: Secondary | ICD-10-CM

## 2011-12-29 LAB — LIPID PANEL
HDL: 49.4 mg/dL (ref >39.00)
Triglycerides: 100 mg/dL (ref 0.0–149.0)

## 2011-12-29 LAB — AST: AST: 24 U/L (ref 0–37)

## 2012-01-20 ENCOUNTER — Other Ambulatory Visit: Payer: Self-pay | Admitting: *Deleted

## 2012-01-20 MED ORDER — METOPROLOL SUCCINATE ER 25 MG PO TB24: 25.0000 mg | ORAL_TABLET | Freq: Every day | ORAL | Status: DC

## 2012-01-24 ENCOUNTER — Other Ambulatory Visit: Payer: Self-pay | Admitting: *Deleted

## 2012-01-24 MED ORDER — METOPROLOL SUCCINATE ER 25 MG PO TB24: 25.0000 mg | ORAL_TABLET | Freq: Every day | ORAL | Status: DC

## 2012-01-24 NOTE — Telephone Encounter (Signed)
Fax Received. Refill Completed. [name] (R.M.A)   

## 2012-05-22 ENCOUNTER — Other Ambulatory Visit: Payer: Self-pay | Admitting: Internal Medicine

## 2012-07-08 ENCOUNTER — Other Ambulatory Visit: Payer: Self-pay | Admitting: Internal Medicine

## 2012-07-23 ENCOUNTER — Other Ambulatory Visit: Payer: Self-pay | Admitting: Internal Medicine

## 2012-09-12 ENCOUNTER — Other Ambulatory Visit: Payer: Self-pay | Admitting: Internal Medicine

## 2012-09-17 ENCOUNTER — Other Ambulatory Visit: Payer: Self-pay | Admitting: Internal Medicine

## 2012-09-17 NOTE — Telephone Encounter (Signed)
Fax Received. Refill Completed. [name] (R.M.A)  NEED APPOINTMENT

## 2012-09-30 ENCOUNTER — Other Ambulatory Visit: Payer: Self-pay | Admitting: Internal Medicine

## 2012-11-18 ENCOUNTER — Other Ambulatory Visit: Payer: Self-pay | Admitting: Internal Medicine

## 2012-12-03 ENCOUNTER — Other Ambulatory Visit: Payer: Self-pay | Admitting: Internal Medicine

## 2012-12-29 ENCOUNTER — Other Ambulatory Visit: Payer: Self-pay | Admitting: Internal Medicine

## 2013-01-07 ENCOUNTER — Other Ambulatory Visit: Payer: Self-pay | Admitting: Internal Medicine

## 2013-01-12 ENCOUNTER — Other Ambulatory Visit: Payer: Self-pay | Admitting: Internal Medicine

## 2013-01-28 ENCOUNTER — Ambulatory Visit: Payer: Self-pay | Admitting: Internal Medicine

## 2013-02-04 ENCOUNTER — Other Ambulatory Visit: Payer: Self-pay

## 2013-02-04 MED ORDER — ATORVASTATIN CALCIUM 80 MG PO TABS: ORAL_TABLET | ORAL | Status: DC

## 2013-02-04 MED ORDER — ISOSORBIDE MONONITRATE ER 30 MG PO TB24: ORAL_TABLET | ORAL | Status: DC

## 2013-02-04 MED ORDER — METOPROLOL SUCCINATE ER 25 MG PO TB24: ORAL_TABLET | ORAL | Status: DC

## 2013-02-04 MED ORDER — AMLODIPINE BESYLATE 5 MG PO TABS: ORAL_TABLET | ORAL | Status: DC

## 2013-02-08 ENCOUNTER — Ambulatory Visit: Payer: Self-pay | Admitting: Internal Medicine

## 2013-03-01 ENCOUNTER — Encounter: Payer: Self-pay | Admitting: Internal Medicine

## 2013-03-01 ENCOUNTER — Ambulatory Visit (INDEPENDENT_AMBULATORY_CARE_PROVIDER_SITE_OTHER): Payer: Self-pay | Admitting: Internal Medicine

## 2013-03-01 VITALS — BP 141/80 | HR 66 | Ht 72.0 in | Wt 163.0 lb

## 2013-03-01 DIAGNOSIS — E785 Hyperlipidemia, unspecified: Secondary | ICD-10-CM

## 2013-03-01 DIAGNOSIS — I2510 Atherosclerotic heart disease of native coronary artery without angina pectoris: Secondary | ICD-10-CM

## 2013-03-01 DIAGNOSIS — I10 Essential (primary) hypertension: Secondary | ICD-10-CM

## 2013-03-01 MED ORDER — SIMVASTATIN 40 MG PO TABS: 40.0000 mg | ORAL_TABLET | Freq: Every day | ORAL | Status: DC

## 2013-03-01 NOTE — Patient Instructions (Signed)
Your physician wants you to follow-up in: ONE YEAR WITH DR Filbert SchilderOSS You will receive a reminder letter in the mail two months in advance. If you don't receive a letter, please call our office to schedule the follow-up appointment.   STOP LIPITOR  START SIMVASTATIN 40 MG ONCE DAILY  Your physician recommends that you return for lab work in: 2 MONTHS- DO NOT EAT PRIOR TO LAB WORK

## 2013-03-01 NOTE — Progress Notes (Signed)
HPI Patient is a 63 year old with a history of CAD (s/p PTCA/stent of the RCA, cutting angioplasty of the LAD in 2006), CV dz (s/p bilateral CEA) and dyslipidemia. I last saw him in May 2013.  Since seen he has done well from a cardiac standpoint.  He denies CP.  Breathing is OK Mother in law and wife both died over the past several months. Going back to work. No Known Allergies  Current Outpatient Prescriptions  Medication Sig Dispense Refill  . amLODipine (NORVASC) 5 MG tablet TAKE 1 TABLET BY MOUTH EVERY DAY  30 tablet  3  . aspirin 81 MG tablet Take 81 mg by mouth daily.        Marland Kitchen. atorvastatin (LIPITOR) 80 MG tablet TAKE 1 TABLET BY MOUTH EVERY DAY  30 tablet  2  . isosorbide mononitrate (IMDUR) 30 MG 24 hr tablet TAKE 1 TABLET BY MOUTH EVERY DAY *PATIENT NEEDS TO CALL TO MAKE AN APPT FOR FUTURE REFILLS*  30 tablet  3  . metoprolol succinate (TOPROL-XL) 25 MG 24 hr tablet TAKE 1 TABLET BY MOUTH EVERY DAY  30 tablet  3   No current facility-administered medications for this visit.    Past Medical History  Diagnosis Date  . Dyslipidemia   . Hypertension   . Cerebrovascular disease   . Coronary artery disease   . Pain in joint, shoulder region     Past Surgical History  Procedure Laterality Date  . Ptca      stent placement January 2006  . Carotid endarterectomy      left/Jan. 2006/ right;Dacron patch angioplasty    Family History  Problem Relation Age of Onset  . Alzheimer's disease Mother   . Heart disease Father     History   Social History  . Marital Status: Married    Spouse Name: N/A    Number of Children: N/A  . Years of Education: N/A   Occupational History  . Production designer, theatre/television/filmmaster mechanic     [employer]   Social History Main Topics  . Smoking status: Former Smoker    Types: Cigarettes    Quit date: 01/25/2011  . Smokeless tobacco: Not on file     Comment: Smokes electronic ciggarettes  . Alcohol Use: Yes  . Drug Use: Not on file  . Sexual Activity: Not on file    Other Topics Concern  . Not on file   Social History Narrative  . No narrative on file    Review of Systems:  All systems reviewed.  They are negative to the above problem except as previously stated.  Vital Signs: BP 141/80  Pulse 66  Ht 6' (1.829 m)  Wt 163 lb (73.936 kg)  BMI 22.10 kg/m2  Physical Exam Patient is in NAD HEENT:  Normocephalic, atraumatic. EOMI, PERRLA.  Neck: JVP is normal. No thyromegaly. Lungs: clear to auscultation. No rales no wheezes.  Heart: Regular rate and rhythm. Normal S1, S2. No S3.   No significant murmurs. PMI not displaced.  Abdomen:  Supple, nontender. Normal bowel sounds. No masses. No hepatomegaly.  Extremities:   Good distal pulses throughout. No lower extremity edema.  Musculoskeletal :moving all extremities.  Neuro:   alert and oriented x3.  CN II-XII grossly intact.  EKG:  SR  66.  Occasional PVC  Assessment and Plan: 1.  CAD.  No symptoms of angina.  Keep on same regimen 2.  CV disease  Keep on medical Rx. 3.  HTN  Adequate control  4.  Dyslipidemia.  Patinet could not afford Lipitor without insurance  Had problems with Crestor.  Will rx with simvistatin.

## 2013-04-29 ENCOUNTER — Other Ambulatory Visit (INDEPENDENT_AMBULATORY_CARE_PROVIDER_SITE_OTHER): Payer: Self-pay

## 2013-04-29 DIAGNOSIS — I10 Essential (primary) hypertension: Secondary | ICD-10-CM

## 2013-04-29 DIAGNOSIS — I2510 Atherosclerotic heart disease of native coronary artery without angina pectoris: Secondary | ICD-10-CM

## 2013-04-29 DIAGNOSIS — E785 Hyperlipidemia, unspecified: Secondary | ICD-10-CM

## 2013-04-29 LAB — LIPID PANEL
CHOL/HDL RATIO: 3
Cholesterol: 172 mg/dL (ref 0–200)
HDL: 52.8 mg/dL (ref >39.00)
LDL CALC: 96 mg/dL (ref 0–99)
Triglycerides: 117 mg/dL (ref 0.0–149.0)
VLDL: 23.4 mg/dL (ref 0.0–40.0)

## 2013-04-29 LAB — HEPATIC FUNCTION PANEL
ALBUMIN: 3.8 g/dL (ref 3.5–5.2)
ALK PHOS: 50 U/L (ref 39–117)
ALT: 27 U/L (ref 0–53)
AST: 24 U/L (ref 0–37)
BILIRUBIN DIRECT: 0.1 mg/dL (ref 0.0–0.3)
Total Bilirubin: 0.6 mg/dL (ref 0.3–1.2)
Total Protein: 7.2 g/dL (ref 6.0–8.3)

## 2013-04-29 LAB — CBC WITH DIFFERENTIAL/PLATELET
BASOS PCT: 0.6 % (ref 0.0–3.0)
Basophils Absolute: 0 10*3/uL (ref 0.0–0.1)
Eosinophils Absolute: 0.3 10*3/uL (ref 0.0–0.7)
Eosinophils Relative: 3.7 % (ref 0.0–5.0)
HEMATOCRIT: 42 % (ref 39.0–52.0)
Hemoglobin: 14.4 g/dL (ref 13.0–17.0)
Lymphocytes Relative: 26.6 % (ref 12.0–46.0)
Lymphs Abs: 1.8 10*3/uL (ref 0.7–4.0)
MCHC: 34.4 g/dL (ref 30.0–36.0)
MCV: 92.4 fl (ref 78.0–100.0)
Monocytes Absolute: 0.8 10*3/uL (ref 0.1–1.0)
Monocytes Relative: 11.6 % (ref 3.0–12.0)
Neutro Abs: 3.9 10*3/uL (ref 1.4–7.7)
Neutrophils Relative %: 57.5 % (ref 43.0–77.0)
Platelets: 246 10*3/uL (ref 150.0–400.0)
RBC: 4.54 Mil/uL (ref 4.22–5.81)
RDW: 12.9 % (ref 11.5–14.6)
WBC: 6.8 10*3/uL (ref 4.5–10.5)

## 2013-05-07 ENCOUNTER — Telehealth: Payer: Self-pay | Admitting: Internal Medicine

## 2013-05-07 NOTE — Telephone Encounter (Signed)
Left message for pt to call.

## 2013-05-07 NOTE — Telephone Encounter (Signed)
New Message ° °Pt returned call for results//SR  °

## 2013-05-07 NOTE — Telephone Encounter (Signed)
Spoke with [name], aware of lipid results and dr Tenny Crawross recommendations. No samples of zetia available at this time. The [name] is having a lot of financial trouble at this time and wants to wait on adding any new meds. He will try watching his diet closer.

## 2013-06-01 ENCOUNTER — Other Ambulatory Visit: Payer: Self-pay | Admitting: Internal Medicine

## 2013-06-03 ENCOUNTER — Other Ambulatory Visit: Payer: Self-pay

## 2013-06-18 ENCOUNTER — Other Ambulatory Visit: Payer: Self-pay | Admitting: Internal Medicine

## 2013-08-05 ENCOUNTER — Other Ambulatory Visit: Payer: Self-pay | Admitting: Internal Medicine

## 2013-10-08 ENCOUNTER — Other Ambulatory Visit: Payer: Self-pay | Admitting: Internal Medicine

## 2013-10-21 ENCOUNTER — Other Ambulatory Visit: Payer: Self-pay | Admitting: Internal Medicine

## 2013-12-07 ENCOUNTER — Other Ambulatory Visit: Payer: Self-pay | Admitting: Internal Medicine

## 2014-02-09 ENCOUNTER — Other Ambulatory Visit: Payer: Self-pay | Admitting: Internal Medicine

## 2014-02-23 ENCOUNTER — Other Ambulatory Visit: Payer: Self-pay | Admitting: Internal Medicine

## 2014-03-15 ENCOUNTER — Other Ambulatory Visit: Payer: Self-pay | Admitting: Internal Medicine

## 2014-03-24 ENCOUNTER — Encounter: Payer: Self-pay | Admitting: Internal Medicine

## 2014-04-01 ENCOUNTER — Telehealth: Payer: Self-pay | Admitting: Internal Medicine

## 2014-04-01 DIAGNOSIS — E785 Hyperlipidemia, unspecified: Secondary | ICD-10-CM

## 2014-04-01 DIAGNOSIS — I10 Essential (primary) hypertension: Secondary | ICD-10-CM

## 2014-04-01 NOTE — Telephone Encounter (Signed)
New EMssage  Pt called to make appt w/ Tenny Crawoss in April. Pt asked about labwork to be done prior. No orders in Epic. Please call back and discuss.

## 2014-04-01 NOTE — Telephone Encounter (Signed)
Patient is coming up on his 1 year follow-up. Will forward to Dr. Tenny Crawoss to see what labs she would like patient to have prior to visit.

## 2014-04-01 NOTE — Telephone Encounter (Signed)
Should have CBC, BMET, Lipid and AST prior to clinic visit

## 2014-04-02 NOTE — Telephone Encounter (Signed)
Orders placed. Staff message to scheduler to make appointment.

## 2014-04-12 ENCOUNTER — Other Ambulatory Visit: Payer: Self-pay | Admitting: Internal Medicine

## 2014-04-26 ENCOUNTER — Other Ambulatory Visit: Payer: Self-pay | Admitting: Internal Medicine

## 2014-04-30 ENCOUNTER — Other Ambulatory Visit (INDEPENDENT_AMBULATORY_CARE_PROVIDER_SITE_OTHER): Payer: Medicare Other | Admitting: *Deleted

## 2014-04-30 DIAGNOSIS — E785 Hyperlipidemia, unspecified: Secondary | ICD-10-CM | POA: Diagnosis not present

## 2014-04-30 DIAGNOSIS — I10 Essential (primary) hypertension: Secondary | ICD-10-CM | POA: Diagnosis not present

## 2014-04-30 LAB — LIPID PANEL
CHOL/HDL RATIO: 3
Cholesterol: 142 mg/dL (ref 0–200)
HDL: 48.3 mg/dL (ref >39.00)
LDL Cholesterol: 76 mg/dL (ref 0–99)
NonHDL: 93.7
TRIGLYCERIDES: 90 mg/dL (ref 0.0–149.0)
VLDL: 18 mg/dL (ref 0.0–40.0)

## 2014-04-30 LAB — BASIC METABOLIC PANEL
BUN: 11 mg/dL (ref 6–23)
CO2: 30 mEq/L (ref 19–32)
Calcium: 9.8 mg/dL (ref 8.4–10.5)
Chloride: 105 mEq/L (ref 96–112)
Creatinine, Ser: 0.82 mg/dL (ref 0.40–1.50)
GFR: 100.21 mL/min (ref >60.00)
Glucose, Bld: 91 mg/dL (ref 70–99)
POTASSIUM: 4.1 meq/L (ref 3.5–5.1)
Sodium: 139 mEq/L (ref 135–145)

## 2014-04-30 LAB — CBC
HEMATOCRIT: 41.5 % (ref 39.0–52.0)
HEMOGLOBIN: 14.2 g/dL (ref 13.0–17.0)
MCHC: 34.2 g/dL (ref 30.0–36.0)
MCV: 91.3 fl (ref 78.0–100.0)
PLATELETS: 283 10*3/uL (ref 150.0–400.0)
RBC: 4.55 Mil/uL (ref 4.22–5.81)
RDW: 13.2 % (ref 11.5–15.5)
WBC: 7.5 10*3/uL (ref 4.0–10.5)

## 2014-04-30 LAB — AST: AST: 17 U/L (ref 0–37)

## 2014-05-05 ENCOUNTER — Telehealth: Payer: Self-pay | Admitting: *Deleted

## 2014-05-05 ENCOUNTER — Encounter: Payer: Self-pay | Admitting: Internal Medicine

## 2014-05-05 ENCOUNTER — Ambulatory Visit (INDEPENDENT_AMBULATORY_CARE_PROVIDER_SITE_OTHER): Payer: Medicare Other | Admitting: Internal Medicine

## 2014-05-05 VITALS — BP 104/76 | HR 82 | Ht 72.0 in | Wt 168.8 lb

## 2014-05-05 DIAGNOSIS — I10 Essential (primary) hypertension: Secondary | ICD-10-CM | POA: Diagnosis not present

## 2014-05-05 NOTE — Progress Notes (Signed)
Cardiology Office Note   Date:  05/05/2014   ID:  ROCHELL [name], DOB 08-25-1950, MRN [id number]  PCP:  No primary care provider on file.  Cardiologist:   Dietrich Pates, MD   Patient presents for follow up of CAD      History of Present Illness: [name] is a 64 y.o. male with a history of  Patient is a 64 year old with a history of CAD (s/p PTCA/stent of the RCA, cutting angioplasty of the LAD in 2006), CV dz (s/p bilateral CEA) and dyslipidemia. I last saw him last year  Since seen he was done well from a cardiac standpoint.  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 amLODipine (NORVASC) 5 MG tablet TAKE 1 TABLET BY MOUTH EVERY DAY 30 tablet 0  . aspirin 81 MG tablet Take 81 mg by mouth daily.      . isosorbide mononitrate (IMDUR) 30 MG 24 hr tablet TAKE 1 TABLET BY MOUTH EVERY DAY PATIENT NEEDS APPT. 30 tablet 0  . metoprolol succinate (TOPROL-XL) 25 MG 24 hr tablet TAKE 1 TABLET BY MOUTH EVERY DAY *PT NEEDS APPT* 30 tablet 0  . simvastatin (ZOCOR) 40 MG tablet TAKE 1 TABLET BY MOUTH AT BEDTIME 30 tablet 0   No current facility-administered medications for this visit.    Allergies:   Review of patient's allergies indicates no known allergies.   Past Medical History  Diagnosis Date  . Dyslipidemia   . Hypertension   . Cerebrovascular disease   . Coronary artery disease   . Pain in joint, shoulder region     Past Surgical History  Procedure Laterality Date  . Ptca      stent placement January 2006  . Carotid endarterectomy      left/Jan. 2006/ right;Dacron patch angioplasty     Social History:  The patient  reports that he quit smoking about 3 years ago. His smoking use included Cigarettes. He does not have any smokeless tobacco history on file. He reports that he drinks alcohol.   Family History:  The patient's family history includes Alzheimer's  disease in his mother; Heart disease in his father.    ROS:  Please see the history of present illness. All other systems are reviewed and  Negative to the above problem except as noted.    PHYSICAL EXAM: VS:  BP 104/76 mmHg  Pulse 82  Ht 6' (1.829 m)  Wt 168 lb 12.8 oz (76.567 kg)  BMI 22.89 kg/m2  GEN: Well nourished, well developed, in no acute distress HEENT: normal Neck: no JVD, carotid bruits, or masses Cardiac: RRR; no murmurs, rubs, or gallops,no edema  Respiratory:  clear to auscultation bilaterally, normal work of breathing GI: soft, nontender, nondistended, + BS  No hepatomegaly  MS: no deformity Moving all extremities   Skin: warm and dry, no rash Neuro:  Strength and sensation are intact Psych: euthymic mood, full affect   EKG:  EKG is ordered today.  SR 82 bpm  Septal MI     Lipid Panel    Component Value Date/Time   CHOL 142 04/30/2014 0758   TRIG 90.0 04/30/2014 0758   HDL 48.30 04/30/2014 0758   CHOLHDL 3 04/30/2014 0758   VLDL 18.0 04/30/2014 0758   LDLCALC 76 04/30/2014 0758      Wt Readings from Last 3 Encounters:  05/05/14  168 lb 12.8 oz (76.567 kg)  03/01/13 163 lb (73.936 kg)  06/06/11 174 lb (78.926 kg)      ASSESSMENT AND PLAN:  1  CAD  Doing well  No symptmos of angina.  2.  HL  Keep on statin  4.  HTN  Adequate control  NO dizziness   Disposition:   FU with me in 12 months    Signed, Dietrich PatesPaula Auguste Tebbetts, MD  05/05/2014 3:35 PM    [facility] [address], [place], [place]  [address] Phone: [phone]; Fax: [phone]

## 2014-05-05 NOTE — Telephone Encounter (Signed)
Patient had labs checked last week. Per Dr. Tenny Crawoss labs are excellent. Called patient to inform. He verbalizes understanding.  Has appointment today in cardiology.

## 2014-05-05 NOTE — Patient Instructions (Signed)
Your physician recommends that you continue on your current medications as directed. Please refer to the Current Medication list given to you today. Your physician wants you to follow-up in: 1 year with Dr. Ross.  You will receive a reminder letter in the mail two months in advance. If you don't receive a letter, please call our office to schedule the follow-up appointment.  

## 2014-05-14 ENCOUNTER — Other Ambulatory Visit: Payer: Self-pay | Admitting: Internal Medicine

## 2014-05-24 ENCOUNTER — Other Ambulatory Visit: Payer: Self-pay | Admitting: Internal Medicine

## 2015-04-27 ENCOUNTER — Ambulatory Visit: Payer: Medicare Other | Admitting: Internal Medicine

## 2015-05-08 ENCOUNTER — Other Ambulatory Visit: Payer: Self-pay | Admitting: Internal Medicine

## 2015-05-15 ENCOUNTER — Ambulatory Visit: Payer: Medicare Other | Admitting: Internal Medicine

## 2015-05-24 ENCOUNTER — Other Ambulatory Visit: Payer: Self-pay | Admitting: Internal Medicine

## 2015-06-09 ENCOUNTER — Other Ambulatory Visit: Payer: Self-pay | Admitting: Internal Medicine

## 2015-06-12 ENCOUNTER — Other Ambulatory Visit: Payer: Self-pay | Admitting: Internal Medicine

## 2015-06-15 ENCOUNTER — Other Ambulatory Visit: Payer: Self-pay | Admitting: Internal Medicine

## 2015-06-19 ENCOUNTER — Ambulatory Visit: Payer: Medicare Other | Admitting: Internal Medicine

## 2015-06-24 ENCOUNTER — Other Ambulatory Visit: Payer: Self-pay | Admitting: Internal Medicine

## 2015-07-11 ENCOUNTER — Other Ambulatory Visit: Payer: Self-pay | Admitting: Internal Medicine

## 2015-07-12 ENCOUNTER — Other Ambulatory Visit: Payer: Self-pay | Admitting: Internal Medicine

## 2015-07-13 ENCOUNTER — Other Ambulatory Visit: Payer: Self-pay | Admitting: Internal Medicine

## 2015-10-07 ENCOUNTER — Other Ambulatory Visit: Payer: Self-pay | Admitting: Internal Medicine

## 2015-10-16 ENCOUNTER — Encounter (INDEPENDENT_AMBULATORY_CARE_PROVIDER_SITE_OTHER): Payer: Self-pay

## 2015-10-16 ENCOUNTER — Encounter: Payer: Self-pay | Admitting: Internal Medicine

## 2015-10-16 ENCOUNTER — Other Ambulatory Visit: Payer: Self-pay | Admitting: Internal Medicine

## 2015-10-16 ENCOUNTER — Ambulatory Visit (INDEPENDENT_AMBULATORY_CARE_PROVIDER_SITE_OTHER): Payer: Medicare Other | Admitting: Internal Medicine

## 2015-10-16 VITALS — BP 134/80 | HR 60 | Ht 72.0 in | Wt 162.4 lb

## 2015-10-16 DIAGNOSIS — E785 Hyperlipidemia, unspecified: Secondary | ICD-10-CM | POA: Diagnosis not present

## 2015-10-16 DIAGNOSIS — I10 Essential (primary) hypertension: Secondary | ICD-10-CM

## 2015-10-16 DIAGNOSIS — I2510 Atherosclerotic heart disease of native coronary artery without angina pectoris: Secondary | ICD-10-CM | POA: Diagnosis not present

## 2015-10-16 DIAGNOSIS — Z0000 Encounter for general adult medical examination without abnormal findings: Secondary | ICD-10-CM

## 2015-10-16 DIAGNOSIS — R32 Unspecified urinary incontinence: Secondary | ICD-10-CM

## 2015-10-16 LAB — COMPREHENSIVE METABOLIC PANEL
ALBUMIN: 4.2 g/dL (ref 3.6–5.1)
ALK PHOS: 65 U/L (ref 40–115)
ALT: 15 U/L (ref 9–46)
AST: 20 U/L (ref 10–35)
BILIRUBIN TOTAL: 0.4 mg/dL (ref 0.2–1.2)
BUN: 9 mg/dL (ref 7–25)
CALCIUM: 9.4 mg/dL (ref 8.6–10.3)
CO2: 28 mmol/L (ref 20–31)
Chloride: 104 mmol/L (ref 98–110)
Creat: 0.89 mg/dL (ref 0.70–1.25)
Glucose, Bld: 91 mg/dL (ref 65–99)
POTASSIUM: 4.2 mmol/L (ref 3.5–5.3)
Sodium: 140 mmol/L (ref 135–146)
TOTAL PROTEIN: 7.3 g/dL (ref 6.1–8.1)

## 2015-10-16 LAB — LIPID PANEL
CHOLESTEROL: 158 mg/dL (ref 125–200)
HDL: 56 mg/dL (ref >=40)
LDL CALC: 82 mg/dL (ref <130)
TRIGLYCERIDES: 100 mg/dL (ref <150)
Total CHOL/HDL Ratio: 2.8 Ratio (ref <=5.0)
VLDL: 20 mg/dL (ref <30)

## 2015-10-16 LAB — PSA: PSA: 0.2 ng/mL (ref <=4.0)

## 2015-10-16 LAB — CBC
HEMATOCRIT: 43.4 % (ref 38.5–50.0)
HEMOGLOBIN: 14.7 g/dL (ref 13.2–17.1)
MCH: 31.2 pg (ref 27.0–33.0)
MCHC: 33.9 g/dL (ref 32.0–36.0)
MCV: 92.1 fL (ref 80.0–100.0)
MPV: 10 fL (ref 7.5–12.5)
Platelets: 308 10*3/uL (ref 140–400)
RBC: 4.71 MIL/uL (ref 4.20–5.80)
RDW: 12.8 % (ref 11.0–15.0)
WBC: 8.2 10*3/uL (ref 3.8–10.8)

## 2015-10-16 LAB — TSH: TSH: 1.53 m[IU]/L (ref 0.40–4.50)

## 2015-10-16 NOTE — Progress Notes (Signed)
Cardiology Office Note   Date:  10/16/2015   ID:  [name], DOB Apr 07, 1950, MRN [id number]  PCP:  No PCP Per Patient  Cardiologist:   Omar Pates, MD   F/U of CAD     History of Present Illness: [name] is a 65 y.o. male with a history of CAD (s/p PTCA/stent of the RCA, cutting angioplasty of the LAD in 2006), CV dz (s/p bilateral CEA) and dyslipidemia. I last saw him last year (APril 2016)  Denies CP  Breathign is OK Appetite down but wt fairly stable    Staying active  Delivers large trees      Outpatient Medications Prior to Visit  Medication Sig Dispense Refill  . amLODipine (NORVASC) 5 MG tablet TAKE 1 TABLET BY MOUTH EVERY DAY 30 tablet 1  . aspirin 81 MG tablet Take 81 mg by mouth daily.      . isosorbide mononitrate (IMDUR) 30 MG 24 hr tablet TAKE 1 TABLET (30 MG TOTAL) BY MOUTH DAILY. 30 tablet 1  . metoprolol succinate (TOPROL-XL) 25 MG 24 hr tablet Take 1 tablet (25 mg total) by mouth daily. Please keep 10/16/15 appointment for further refills 30 tablet 3  . simvastatin (ZOCOR) 40 MG tablet TAKE 1 TABLET BY MOUTH AT BEDTIME 30 tablet 2   No facility-administered medications prior to visit.      Allergies:   Review of patient's allergies indicates no known allergies.   Past Medical History:  Diagnosis Date  . Cerebrovascular disease   . Coronary artery disease   . Dyslipidemia   . Hypertension   . Pain in joint, shoulder region     Past Surgical History:  Procedure Laterality Date  . CAROTID ENDARTERECTOMY     left/Jan. 2006/ right;Dacron patch angioplasty  . PTCA     stent placement January 2006     Social History:  The patient  reports that he quit smoking about 4 years ago. His smoking use included Cigarettes. He has never used smokeless tobacco. He reports that he drinks alcohol.   Family History:  The patient's family history includes Alzheimer's disease in his mother; Heart disease in his father.    ROS:  Please see the history  of present illness. All other systems are reviewed and  Negative to the above problem except as noted.    PHYSICAL EXAM: VS:  BP 134/80   Pulse 60   Ht 6' (1.829 m)   Wt 162 lb 6.4 oz (73.7 kg)   BMI 22.03 kg/m   GEN: Well nourished, well developed, in no acute distress HEENT: normal Neck: no JVD, carotid bruits, or masses Cardiac: RRR; no murmurs, rubs, or gallops,no edema  Respiratory:  clear to auscultation bilaterally, normal work of breathing GI: soft, nontender, nondistended, + BS  No hepatomegaly  MS: no deformity Moving all extremities   Skin: warm and dry, no rash Neuro:  Strength and sensation are intact Psych: euthymic mood, full affect   EKG:  EKG is ordered today. SB 59  Septal MI    Lipid Panel    Component Value Date/Time   CHOL 142 04/30/2014 0758   TRIG 90.0 04/30/2014 0758   HDL 48.30 04/30/2014 0758   CHOLHDL 3 04/30/2014 0758   VLDL 18.0 04/30/2014 0758   LDLCALC 76 04/30/2014 0758      Wt Readings from Last 3 Encounters:  10/16/15 162 lb 6.4 oz (73.7 kg)  05/05/14 168 lb 12.8 oz (76.6 kg)  03/01/13 163  lb (73.9 kg)      ASSESSMENT AND PLAN:  1  CAD  NO symptoms to sugg angina    2  hL  Check lipids     3  HCM  Encouraged him to get colonoscopy    Check PSA, TSH  CBC, Lipids , CMET today    F/U in 1 year     Current medicines are reviewed at length with the patient today.  The patient does not have concerns regarding medicines.     Signed, Omar PatesPaula Ross, MD  10/16/2015 8:55 AM    [name] [address], [place], [place]  [address] Phone: [phone]; Fax: [phone]

## 2015-10-16 NOTE — Patient Instructions (Signed)
Your physician recommends that you continue on your current medications as directed. Please refer to the Current Medication list given to you today. Your physician recommends that you return for lab work in:  Today (CBC, CMET,TSH, LIPIDS, PSA) Your physician wants you to follow-up in: 1 YEAR WITH DR. Tenny CrawOSS.  You will receive a reminder letter in the mail two months in advance. If you don't receive a letter, please call our office to schedule the follow-up appointment.

## 2015-10-17 ENCOUNTER — Other Ambulatory Visit: Payer: Self-pay | Admitting: Internal Medicine

## 2015-12-10 ENCOUNTER — Other Ambulatory Visit: Payer: Self-pay | Admitting: Internal Medicine

## 2015-12-14 ENCOUNTER — Other Ambulatory Visit: Payer: Self-pay | Admitting: Internal Medicine

## 2016-10-06 ENCOUNTER — Other Ambulatory Visit: Payer: Self-pay | Admitting: Internal Medicine

## 2016-10-14 ENCOUNTER — Other Ambulatory Visit: Payer: Self-pay | Admitting: Internal Medicine

## 2016-11-15 ENCOUNTER — Other Ambulatory Visit: Payer: Self-pay | Admitting: Internal Medicine

## 2016-12-06 ENCOUNTER — Other Ambulatory Visit: Payer: Self-pay | Admitting: Internal Medicine

## 2016-12-10 DIAGNOSIS — Z23 Encounter for immunization: Secondary | ICD-10-CM | POA: Diagnosis not present

## 2016-12-12 ENCOUNTER — Other Ambulatory Visit: Payer: Self-pay | Admitting: Internal Medicine

## 2016-12-12 DIAGNOSIS — I10 Essential (primary) hypertension: Secondary | ICD-10-CM | POA: Insufficient documentation

## 2016-12-12 DIAGNOSIS — I779 Disorder of arteries and arterioles, unspecified: Secondary | ICD-10-CM | POA: Insufficient documentation

## 2016-12-12 DIAGNOSIS — E785 Hyperlipidemia, unspecified: Secondary | ICD-10-CM | POA: Insufficient documentation

## 2016-12-12 DIAGNOSIS — I679 Cerebrovascular disease, unspecified: Secondary | ICD-10-CM | POA: Insufficient documentation

## 2016-12-12 DIAGNOSIS — I2510 Atherosclerotic heart disease of native coronary artery without angina pectoris: Secondary | ICD-10-CM | POA: Insufficient documentation

## 2016-12-13 ENCOUNTER — Other Ambulatory Visit: Payer: Self-pay | Admitting: Internal Medicine

## 2016-12-23 ENCOUNTER — Ambulatory Visit (INDEPENDENT_AMBULATORY_CARE_PROVIDER_SITE_OTHER): Payer: Medicare Other | Admitting: Internal Medicine

## 2016-12-23 ENCOUNTER — Encounter: Payer: Self-pay | Admitting: Internal Medicine

## 2016-12-23 VITALS — BP 152/82 | HR 69 | Ht 72.0 in | Wt 168.0 lb

## 2016-12-23 DIAGNOSIS — R3911 Hesitancy of micturition: Secondary | ICD-10-CM

## 2016-12-23 DIAGNOSIS — I2510 Atherosclerotic heart disease of native coronary artery without angina pectoris: Secondary | ICD-10-CM | POA: Diagnosis not present

## 2016-12-23 DIAGNOSIS — I10 Essential (primary) hypertension: Secondary | ICD-10-CM

## 2016-12-23 DIAGNOSIS — E782 Mixed hyperlipidemia: Secondary | ICD-10-CM | POA: Diagnosis not present

## 2016-12-23 NOTE — Patient Instructions (Signed)
Your physician recommends that you continue on your current medications as directed. Please refer to the Current Medication list given to you today.  Your physician recommends that you return for lab work TODAY (CBC, BMET, TSH, LIPIDS, PSA)  Your physician wants you to follow-up in: 1 YEAR WITH DR. Tenny CrawOSS.  You will receive a reminder letter in the mail two months in advance. If you don't receive a letter, please call our office to schedule the follow-up appointment.

## 2016-12-23 NOTE — Progress Notes (Signed)
Cardiology Office Note   Date:  12/23/2016   ID:  [name], DOB May 03, 1950, MRN [id number]  PCP:  Patient, No Pcp Per  Cardiologist:   Dietrich PatesPaula Natalya Domzalski, MD   F/U of CAD     History of Present Illness: [name] is a 66 y.o. male with a history of CAD (s/p PTCA/stent of the RCA, cutting angioplasty of the LAD in 2006), CV dz (s/p bilateral CEA) and dyslipidemia. 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    Active  NO CP  No SOB    Outpatient Medications Prior to Visit  Medication Sig Dispense Refill  . amLODipine (NORVASC) 5 MG tablet Take 1 tablet (5 mg total) daily by mouth. Please make overdue appt with Dr. Tenny Crawoss before anymore refills. 2nd attempt 15 tablet 0  . aspirin 81 MG tablet Take 81 mg by mouth daily.      . isosorbide mononitrate (IMDUR) 30 MG 24 hr tablet Take 1 tablet (30 mg total) daily by mouth. Please make overdue appt with Dr. Tenny Crawoss before anymore refills. 2nd attempt 15 tablet 0  . metoprolol succinate (TOPROL-XL) 25 MG 24 hr tablet Take 1 tablet (25 mg total) by mouth daily. Patient needs to keep upcoming appointment for further refills 30 tablet 0  . simvastatin (ZOCOR) 40 MG tablet Take 1 tablet (40 mg total) by mouth at bedtime. Patient needs to keep upcoming appointment for further refills 30 tablet 0  . amLODipine (NORVASC) 5 MG tablet Take 1 tablet (5 mg total) by mouth daily. Patient must keep 12/23/16 appointment for further refills (Patient not taking: Reported on 12/23/2016) 30 tablet 0   No facility-administered medications prior to visit.      Allergies:   Patient has no known allergies.   Past Medical History:  Diagnosis Date  . Cerebrovascular disease   . Coronary artery disease   . Dyslipidemia   . Hypertension   . Pain in joint, shoulder region     Past Surgical History:  Procedure Laterality Date  . CAROTID ENDARTERECTOMY     left/Jan. 2006/ right;Dacron patch angioplasty  . PTCA      stent placement January 2006     Social History:  The patient  reports that he quit smoking about 5 years ago. His smoking use included cigarettes. he has never used smokeless tobacco. He reports that he drinks alcohol.   Family History:  The patient's family history includes Alzheimer's disease in his mother; Heart disease in his father.    ROS:  Please see the history of present illness. All other systems are reviewed and  Negative to the above problem except as noted.    PHYSICAL EXAM: VS:  BP (!) 152/82   Pulse 69   Ht 6' (1.829 m)   Wt 168 lb (76.2 kg)   BMI 22.78 kg/m   GEN: Well nourished, well developed, in no acute distress  HEENT: normal  Neck: JVP is normal , carotid bruits, or masses Cardiac: RRR; no murmurs, rubs, or gallops,no edema  Respiratory:  clear to auscultation bilaterally, normal work of breathing GI: soft, nontender, nondistended, + BS  No hepatomegaly  MS: no deformity Moving all extremities   Skin: warm and dry, no rash Neuro:  Strength and sensation are intact Psych: euthymic mood, full affect   EKG:  EKG is ordered today.  SR 69      Lipid Panel  Component Value Date/Time   CHOL 158 10/16/2015 0917   TRIG 100 10/16/2015 0917   HDL 56 10/16/2015 0917   CHOLHDL 2.8 10/16/2015 0917   VLDL 20 10/16/2015 0917   LDLCALC 82 10/16/2015 0917      Wt Readings from Last 3 Encounters:  12/23/16 168 lb (76.2 kg)  10/16/15 162 lb 6.4 oz (73.7 kg)  05/05/14 168 lb 12.8 oz (76.6 kg)      ASSESSMENT AND PLAN:  1  CAD  NO symptoms to sugg angina  Very active    2  hL  Check lipids     3  HCM  Needs colonoscopy   4  HTN  BP 120 to 130   At home    Will need to follow  Check PSA, TSH  CBC, Lipids , CMET today    F/U in 1 year     Current medicines are reviewed at length with the patient today.  The patient does not have concerns regarding medicines.     Signed, Dietrich PatesPaula Braileigh Landenberger, MD  12/23/2016 4:16 PM    [facility] [address], [place], [place]  [address] Phone: [phone]; Fax: [phone]

## 2016-12-24 LAB — BASIC METABOLIC PANEL
BUN/Creatinine Ratio: 10 (ref 10–24)
BUN: 9 mg/dL (ref 8–27)
CALCIUM: 9.6 mg/dL (ref 8.6–10.2)
CO2: 25 mmol/L (ref 20–29)
CREATININE: 0.92 mg/dL (ref 0.76–1.27)
Chloride: 102 mmol/L (ref 96–106)
GFR, EST AFRICAN AMERICAN: 99 mL/min/{1.73_m2} (ref >59)
GFR, EST NON AFRICAN AMERICAN: 86 mL/min/{1.73_m2} (ref >59)
Glucose: 100 mg/dL — ABNORMAL HIGH (ref 65–99)
POTASSIUM: 4.6 mmol/L (ref 3.5–5.2)
Sodium: 140 mmol/L (ref 134–144)

## 2016-12-24 LAB — CBC
HEMATOCRIT: 42.9 % (ref 37.5–51.0)
HEMOGLOBIN: 14.9 g/dL (ref 13.0–17.7)
MCH: 31.8 pg (ref 26.6–33.0)
MCHC: 34.7 g/dL (ref 31.5–35.7)
MCV: 92 fL (ref 79–97)
Platelets: 272 10*3/uL (ref 150–379)
RBC: 4.68 x10E6/uL (ref 4.14–5.80)
RDW: 13.5 % (ref 12.3–15.4)
WBC: 9.9 10*3/uL (ref 3.4–10.8)

## 2016-12-24 LAB — LIPID PANEL
CHOLESTEROL TOTAL: 171 mg/dL (ref 100–199)
Chol/HDL Ratio: 2.6 ratio (ref 0.0–5.0)
HDL: 65 mg/dL (ref >39)
LDL CALC: 84 mg/dL (ref 0–99)
Triglycerides: 112 mg/dL (ref 0–149)
VLDL CHOLESTEROL CAL: 22 mg/dL (ref 5–40)

## 2016-12-24 LAB — PSA: PROSTATE SPECIFIC AG, SERUM: 0.3 ng/mL (ref 0.0–4.0)

## 2016-12-24 LAB — TSH: TSH: 3.02 u[IU]/mL (ref 0.450–4.500)

## 2016-12-25 ENCOUNTER — Other Ambulatory Visit: Payer: Self-pay | Admitting: Internal Medicine

## 2017-01-09 ENCOUNTER — Other Ambulatory Visit: Payer: Self-pay | Admitting: Internal Medicine

## 2017-01-20 ENCOUNTER — Other Ambulatory Visit: Payer: Self-pay | Admitting: Internal Medicine

## 2017-01-27 ENCOUNTER — Telehealth: Payer: Self-pay | Admitting: *Deleted

## 2017-01-27 DIAGNOSIS — I2510 Atherosclerotic heart disease of native coronary artery without angina pectoris: Secondary | ICD-10-CM

## 2017-01-27 NOTE — Telephone Encounter (Signed)
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   Per   Reviewed with Dr. Tenny Crawoss who recommends echo and gxt.  Called patient and informed he needs further testing and that one of the schedulers will contact him to arrange.

## 2017-02-10 ENCOUNTER — Encounter: Payer: Self-pay | Admitting: Internal Medicine

## 2017-11-03 ENCOUNTER — Other Ambulatory Visit: Payer: Self-pay | Admitting: Internal Medicine

## 2017-11-04 ENCOUNTER — Other Ambulatory Visit: Payer: Self-pay | Admitting: Internal Medicine

## 2017-11-06 NOTE — Telephone Encounter (Signed)
Outpatient Medication Detail    Disp Refills Start End   metoprolol succinate (TOPROL-XL) 25 MG 24 hr tablet 30 tablet 11 01/20/2017    Sig: TAKE 1 TABLET BY MOUTH DAILY **PT NEEDS TO KEEP UPCOMING APPOINTMENT FOR FUTHER REFILLS**   Sent to pharmacy as: metoprolol succinate (TOPROL-XL) 25 MG 24 hr tablet   E-Prescribing Status: Receipt confirmed by pharmacy (01/20/2017 8:36 AM EST)   Pharmacy   [id number] - [place], [place] - [address] [place] [address] AT [facility]

## 2017-11-20 DIAGNOSIS — Z23 Encounter for immunization: Secondary | ICD-10-CM | POA: Diagnosis not present

## 2017-12-03 ENCOUNTER — Other Ambulatory Visit: Payer: Self-pay | Admitting: Internal Medicine

## 2017-12-07 ENCOUNTER — Other Ambulatory Visit: Payer: Self-pay | Admitting: Internal Medicine

## 2017-12-12 ENCOUNTER — Other Ambulatory Visit: Payer: Self-pay | Admitting: Internal Medicine

## 2017-12-30 ENCOUNTER — Other Ambulatory Visit: Payer: Self-pay | Admitting: Internal Medicine

## 2018-01-05 ENCOUNTER — Other Ambulatory Visit: Payer: Self-pay | Admitting: Internal Medicine

## 2018-01-26 ENCOUNTER — Other Ambulatory Visit: Payer: Self-pay | Admitting: Internal Medicine

## 2018-02-02 ENCOUNTER — Encounter: Payer: Self-pay | Admitting: Internal Medicine

## 2018-02-02 ENCOUNTER — Ambulatory Visit (INDEPENDENT_AMBULATORY_CARE_PROVIDER_SITE_OTHER): Payer: Medicare Other | Admitting: Internal Medicine

## 2018-02-02 VITALS — BP 110/78 | HR 71 | Ht 71.5 in | Wt 159.0 lb

## 2018-02-02 DIAGNOSIS — E782 Mixed hyperlipidemia: Secondary | ICD-10-CM | POA: Diagnosis not present

## 2018-02-02 DIAGNOSIS — R3911 Hesitancy of micturition: Secondary | ICD-10-CM

## 2018-02-02 DIAGNOSIS — I2510 Atherosclerotic heart disease of native coronary artery without angina pectoris: Secondary | ICD-10-CM

## 2018-02-02 NOTE — Patient Instructions (Signed)
Medication Instructions:  No changes If you need a refill on your cardiac medications before your next appointment, please call your pharmacy.   Lab work: CBC, CMET, TSH, PSA, LIPID If you have labs (blood work) drawn today and your tests are completely normal, you will receive your results only by: Marland Kitchen MyChart Message (if you have MyChart) OR . A paper copy in the mail If you have any lab test that is abnormal or we need to change your treatment, we will call you to review the results.  Testing/Procedures: Your physician has requested that you have a carotid duplex. This test is an ultrasound of the carotid arteries in your neck. It looks at blood flow through these arteries that supply the brain with blood. Allow one hour for this exam. There are no restrictions or special instructions.   Follow-Up: At [facility], you and your health needs are our priority.  As part of our continuing mission to provide you with exceptional heart care, we have created designated Provider Care Teams.  These Care Teams include your primary Cardiologist (physician) and Advanced Practice Providers (APPs -  Physician Assistants and Nurse Practitioners) who all work together to provide you with the care you need, when you need it. You will need a follow up appointment in:  12 months.  Please call our office 2 months in advance to schedule this appointment.  You may see Dietrich Pates, MD or one of the following Advanced Practice Providers on your designated Care Team: Tereso Newcomer, PA-C [place], [place] . Berton Bon, NP  Any Other Special Instructions Will Be Listed Below (If Applicable).

## 2018-02-02 NOTE — Progress Notes (Signed)
Cardiology Office Note   Date:  02/02/2018   ID:  [name], DOB Oct 04, 1950, MRN [id number]  PCP:  Patient, No Pcp Per  Cardiologist:   Dietrich Pates, MD   F/U of CAD    History of Present Illness: [name] is a 68 y.o. male with a history of CAD (s/p PTCA/stent of the RCA, cutting angioplasty of the LAD in 2006), CV dz (s/p bilateral CEA) and dyslipidemia. I last saw him last year (Fall 2017)  Since seen he has done well  No CP  No SOB   Very active working at [employer] part time    Outpatient Medications Prior to Visit  Medication Sig Dispense Refill  . amLODipine (NORVASC) 5 MG tablet Take 1 tablet (5 mg total) by mouth daily. Please keep upcoming appt in January for future refills. Thank you 30 tablet 0  . aspirin 81 MG tablet Take 81 mg by mouth daily.      . isosorbide mononitrate (IMDUR) 30 MG 24 hr tablet Take 1 tablet (30 mg total) by mouth daily. Please keep upcoming appt in January with Dr. Tenny Craw for future refills. Thank you 90 tablet 0  . metoprolol succinate (TOPROL-XL) 25 MG 24 hr tablet TAKE 1 TABLET BY MOUTH DAILY **PT NEEDS TO KEEP UPCOMING APPOINTMENT FOR FUTHER REFILLS** 30 tablet 1  . simvastatin (ZOCOR) 40 MG tablet Take 1 tablet (40 mg total) by mouth daily at 6 PM. Please keep upcoming appt in January for future refills. Thank you 30 tablet 0   No facility-administered medications prior to visit.      Allergies:   Patient has no known allergies.   Past Medical History:  Diagnosis Date  . Cerebrovascular disease   . Coronary artery disease   . Dyslipidemia   . Hypertension   . Pain in joint, shoulder region     Past Surgical History:  Procedure Laterality Date  . CAROTID ENDARTERECTOMY     left/Jan. 2006/ right;Dacron patch angioplasty  . PTCA     stent placement January 2006     Social History:  The patient  reports that he quit smoking about 7 years ago. His smoking use included cigarettes. He has never used smokeless tobacco.  He reports current alcohol use.   Family History:  The patient's family history includes Alzheimer's disease in his mother; Heart disease in his father.    ROS:  Please see the history of present illness. All other systems are reviewed and  Negative to the above problem except as noted.    PHYSICAL EXAM: VS:  BP 110/78   Pulse 71   Ht 5' 11.5" (1.816 m)   Wt 159 lb (72.1 kg)   BMI 21.87 kg/m   GEN: Well nourished, well developed, in no acute distress  HEENT: normal  Neck: JVP is not elevated  No , carotid bruits, or masses Cardiac: RRR; no murmurs, rubs, or gallops,no edema  Respiratory:  clear to auscultation bilaterally, normal work of breathing GI: soft, nontender, nondistended, + BS  No hepatomegaly  MS: no deformity Moving all extremities   Skin: warm and dry, no rash Neuro:  Strength and sensation are intact Psych: euthymic mood, full affect   EKG:  EKG is ordered today.  SR 71     Lipid Panel    Component Value Date/Time   CHOL 171 12/23/2016 1638   TRIG 112 12/23/2016 1638   HDL 65 12/23/2016 1638   CHOLHDL 2.6 12/23/2016 1638  CHOLHDL 2.8 10/16/2015 0917   VLDL 20 10/16/2015 0917   LDLCALC 84 12/23/2016 1638      Wt Readings from Last 3 Encounters:  02/02/18 159 lb (72.1 kg)  12/23/16 168 lb (76.2 kg)  10/16/15 162 lb 6.4 oz (73.7 kg)      ASSESSMENT AND PLAN:  1  CAD  NO symptoms to sugg angina  Very active    2  hL  Check lipids     3  HCM  Needs colonoscopy  Every year I encouage him     4  HTN  BP is OK   Check PSA, TSH  CBC, Lipids , CMET today    F/U in 1 year     Current medicines are reviewed at length with the patient today.  The patient does not have concerns regarding medicines.     Signed, Dietrich Pates, MD  02/02/2018 10:33 AM    [facility] [address] [place], [place], [place]  [address] Phone: [phone]; Fax: [phone]

## 2018-02-03 LAB — COMPREHENSIVE METABOLIC PANEL
ALT: 46 IU/L — ABNORMAL HIGH (ref 0–44)
AST: 31 IU/L (ref 0–40)
Albumin/Globulin Ratio: 1.7 (ref 1.2–2.2)
Albumin: 4.3 g/dL (ref 3.6–4.8)
Alkaline Phosphatase: 68 IU/L (ref 39–117)
BUN/Creatinine Ratio: 10 (ref 10–24)
BUN: 8 mg/dL (ref 8–27)
Bilirubin Total: 0.5 mg/dL (ref 0.0–1.2)
CALCIUM: 9.8 mg/dL (ref 8.6–10.2)
CO2: 24 mmol/L (ref 20–29)
Chloride: 101 mmol/L (ref 96–106)
Creatinine, Ser: 0.81 mg/dL (ref 0.76–1.27)
GFR calc Af Amer: 106 mL/min/{1.73_m2} (ref >59)
GFR, EST NON AFRICAN AMERICAN: 91 mL/min/{1.73_m2} (ref >59)
GLOBULIN, TOTAL: 2.6 g/dL (ref 1.5–4.5)
Glucose: 93 mg/dL (ref 65–99)
Potassium: 4.8 mmol/L (ref 3.5–5.2)
Sodium: 140 mmol/L (ref 134–144)
Total Protein: 6.9 g/dL (ref 6.0–8.5)

## 2018-02-03 LAB — CBC
Hematocrit: 42.9 % (ref 37.5–51.0)
Hemoglobin: 15.3 g/dL (ref 13.0–17.7)
MCH: 33 pg (ref 26.6–33.0)
MCHC: 35.7 g/dL (ref 31.5–35.7)
MCV: 93 fL (ref 79–97)
Platelets: 276 10*3/uL (ref 150–450)
RBC: 4.64 x10E6/uL (ref 4.14–5.80)
RDW: 12 % (ref 11.6–15.4)
WBC: 9.5 10*3/uL (ref 3.4–10.8)

## 2018-02-03 LAB — PSA: Prostate Specific Ag, Serum: 0.2 ng/mL (ref 0.0–4.0)

## 2018-02-03 LAB — LIPID PANEL
Chol/HDL Ratio: 2.1 ratio (ref 0.0–5.0)
Cholesterol, Total: 174 mg/dL (ref 100–199)
HDL: 83 mg/dL (ref >39)
LDL Calculated: 73 mg/dL (ref 0–99)
Triglycerides: 90 mg/dL (ref 0–149)
VLDL Cholesterol Cal: 18 mg/dL (ref 5–40)

## 2018-02-03 LAB — TSH: TSH: 1.84 u[IU]/mL (ref 0.450–4.500)

## 2018-02-05 ENCOUNTER — Encounter: Payer: Self-pay | Admitting: *Deleted

## 2018-02-07 ENCOUNTER — Other Ambulatory Visit: Payer: Self-pay | Admitting: Internal Medicine

## 2018-03-07 ENCOUNTER — Other Ambulatory Visit: Payer: Self-pay | Admitting: Internal Medicine

## 2018-03-27 ENCOUNTER — Other Ambulatory Visit: Payer: Self-pay | Admitting: Internal Medicine

## 2018-04-02 ENCOUNTER — Inpatient Hospital Stay (HOSPITAL_COMMUNITY): Admission: RE | Admit: 2018-04-02 | Payer: Medicare Other | Source: Ambulatory Visit

## 2018-04-08 ENCOUNTER — Other Ambulatory Visit: Payer: Self-pay | Admitting: Internal Medicine

## 2018-04-10 ENCOUNTER — Ambulatory Visit (HOSPITAL_COMMUNITY)
Admission: RE | Admit: 2018-04-10 | Payer: Medicare Other | Source: Ambulatory Visit | Attending: Internal Medicine | Admitting: Internal Medicine

## 2018-04-10 ENCOUNTER — Other Ambulatory Visit: Payer: Self-pay | Admitting: Internal Medicine

## 2018-04-10 DIAGNOSIS — Z9889 Other specified postprocedural states: Secondary | ICD-10-CM

## 2018-04-10 DIAGNOSIS — I6523 Occlusion and stenosis of bilateral carotid arteries: Secondary | ICD-10-CM

## 2018-10-20 DIAGNOSIS — Z23 Encounter for immunization: Secondary | ICD-10-CM | POA: Diagnosis not present

## 2018-12-24 ENCOUNTER — Other Ambulatory Visit: Payer: Self-pay | Admitting: Internal Medicine

## 2019-01-12 ENCOUNTER — Other Ambulatory Visit: Payer: Self-pay | Admitting: Internal Medicine

## 2019-02-18 ENCOUNTER — Other Ambulatory Visit: Payer: Self-pay | Admitting: Internal Medicine

## 2019-03-16 ENCOUNTER — Other Ambulatory Visit: Payer: Self-pay | Admitting: Internal Medicine

## 2019-03-21 ENCOUNTER — Other Ambulatory Visit: Payer: Self-pay | Admitting: Internal Medicine

## 2019-03-22 ENCOUNTER — Other Ambulatory Visit: Payer: Self-pay | Admitting: Internal Medicine

## 2019-03-22 NOTE — Progress Notes (Signed)
Cardiology Office Note   Date:  03/25/2019   ID:  KHYLE GOODELL, DOB August 10, 1950, MRN [id number]  PCP:  Patient, No Pcp Per  Cardiologist:   Dietrich Pates, MD   F/U of CAD    History of Present Illness: [name] is a 69 y.o. male with a history of CAD (s/p PTCA/stent of the RCA, cutting angioplasty of the LAD in 2006), CV dz (s/p bilateral CEA) and dyslipidemia. I saw him in Jan 2020  Since seen, he has done well.  He denies chest pain.  Breathing is okay.  No dizziness.  No palpitations.  He remains quite active.  The patient has not gotten sick with Covid.  He is trying to get a vaccine  He had an appt but because on bad weath it was canceled.  He is on the wait list now.  Outpatient Medications Prior to Visit  Medication Sig Dispense Refill  . amLODipine (NORVASC) 5 MG tablet TAKE 1 TABLET BY MOUTH EVERY DAY 90 tablet 3  . aspirin 81 MG tablet Take 81 mg by mouth daily.      . isosorbide mononitrate (IMDUR) 30 MG 24 hr tablet TAKE 1 TABLET BY MOUTH EVERY DAY 90 tablet 0  . metoprolol succinate (TOPROL-XL) 25 MG 24 hr tablet TAKE 1 TABLET BY MOUTH DAILY **PT NEEDS TO KEEP UPCOMING APPOINTMENT FOR FUTHER REFILLS** 30 tablet 0  . simvastatin (ZOCOR) 40 MG tablet TAKE 1 TABLET BY MOUTH EVERY DAY AT 6PM 30 tablet 0   No facility-administered medications prior to visit.     Allergies:   Patient has no known allergies.   Past Medical History:  Diagnosis Date  . Cerebrovascular disease   . Coronary artery disease   . Dyslipidemia   . Hypertension   . Pain in joint, shoulder region     Past Surgical History:  Procedure Laterality Date  . CAROTID ENDARTERECTOMY     left/Jan. 2006/ right;Dacron patch angioplasty  . PTCA     stent placement January 2006     Social History:  The patient  reports that he quit smoking about 8 years ago. His smoking use included cigarettes. He has never used smokeless tobacco. He reports current alcohol use.   Family History:  The patient's  family history includes Alzheimer's disease in his mother; Heart disease in his father.    ROS:  Please see the history of present illness. All other systems are reviewed and  Negative to the above problem except as noted.    PHYSICAL EXAM: VS:  BP 130/80   Pulse 84   Ht 6' (1.829 m)   Wt 155 lb 12.8 oz (70.7 kg)   BMI 21.13 kg/m   GEN: Thin 69 year old in no acute distress  HEENT: normal  Neck: JVP is not elevated  No , carotid bruits Cardiac: RRR; no murmurs, rubs, or gallops,no edema  Respiratory:  clear to auscultation bilaterally, normal work of breathing GI: soft, nontender, nondistended, + BS  No hepatomegaly  MS: no deformity Moving all extremities   Skin: warm and dry, no rash Neuro:  Strength and sensation are intact Psych: euthymic mood, full affect   EKG:  EKG is ordered today.  SR 83 bpm.   Lipid Panel    Component Value Date/Time   CHOL 174 02/02/2018 1102   TRIG 90 02/02/2018 1102   HDL 83 02/02/2018 1102   CHOLHDL 2.1 02/02/2018 1102   CHOLHDL 2.8 10/16/2015 0917   VLDL 20 10/16/2015  [place] 73 02/02/2018 1102      Wt Readings from Last 3 Encounters:  03/25/19 155 lb 12.8 oz (70.7 kg)  02/02/18 159 lb (72.1 kg)  12/23/16 168 lb (76.2 kg)      ASSESSMENT AND PLAN:  1  CAD the patient remains symptom-free.  No angina no shortness of breath.  He is very active.  Continue risk factor modification  2  HL  Check lipids   today.  3.  Hypertension adequate control.   4 cerebrovascular disease.  Patient is status post endarterectomies.  We will set up for carotid ultrasound.  This was not done last year because of Covid  5.  Covid infection.  Patient has not been sick.  We will try to help him get into vaccine program.  It is been difficult scheduling with work.  I told him that he needs to do this.  Check PSA, TSH  CBC, Lipids , CMET today    F/U in January February of next year.   Current medicines are reviewed at length with the  patient today.  The patient does not have concerns regarding medicines.     Signed, Dorris Carnes, MD  03/25/2019 8:59 AM    [facility] [place], [place], [place]  [address] Phone: [phone]; Fax: [phone]

## 2019-03-25 ENCOUNTER — Encounter: Payer: Self-pay | Admitting: Internal Medicine

## 2019-03-25 ENCOUNTER — Other Ambulatory Visit: Payer: Self-pay

## 2019-03-25 ENCOUNTER — Ambulatory Visit (INDEPENDENT_AMBULATORY_CARE_PROVIDER_SITE_OTHER): Payer: Medicare Other | Admitting: Internal Medicine

## 2019-03-25 VITALS — BP 130/80 | HR 84 | Ht 72.0 in | Wt 155.8 lb

## 2019-03-25 DIAGNOSIS — E782 Mixed hyperlipidemia: Secondary | ICD-10-CM

## 2019-03-25 DIAGNOSIS — I2510 Atherosclerotic heart disease of native coronary artery without angina pectoris: Secondary | ICD-10-CM | POA: Diagnosis not present

## 2019-03-25 DIAGNOSIS — I679 Cerebrovascular disease, unspecified: Secondary | ICD-10-CM

## 2019-03-25 LAB — LIPID PANEL
Chol/HDL Ratio: 2 ratio (ref 0.0–5.0)
Cholesterol, Total: 168 mg/dL (ref 100–199)
HDL: 86 mg/dL (ref >39)
LDL Chol Calc (NIH): 68 mg/dL (ref 0–99)
Triglycerides: 75 mg/dL (ref 0–149)
VLDL Cholesterol Cal: 14 mg/dL (ref 5–40)

## 2019-03-25 LAB — CBC
Hematocrit: 42.2 % (ref 37.5–51.0)
Hemoglobin: 14.6 g/dL (ref 13.0–17.7)
MCH: 33.7 pg — ABNORMAL HIGH (ref 26.6–33.0)
MCHC: 34.6 g/dL (ref 31.5–35.7)
MCV: 98 fL — ABNORMAL HIGH (ref 79–97)
Platelets: 233 10*3/uL (ref 150–450)
RBC: 4.33 x10E6/uL (ref 4.14–5.80)
RDW: 11.8 % (ref 11.6–15.4)
WBC: 10.1 10*3/uL (ref 3.4–10.8)

## 2019-03-25 LAB — BASIC METABOLIC PANEL
BUN/Creatinine Ratio: 9 — ABNORMAL LOW (ref 10–24)
BUN: 8 mg/dL (ref 8–27)
CO2: 26 mmol/L (ref 20–29)
Calcium: 9.6 mg/dL (ref 8.6–10.2)
Chloride: 103 mmol/L (ref 96–106)
Creatinine, Ser: 0.94 mg/dL (ref 0.76–1.27)
GFR calc Af Amer: 95 mL/min/{1.73_m2} (ref >59)
GFR calc non Af Amer: 82 mL/min/{1.73_m2} (ref >59)
Glucose: 110 mg/dL — ABNORMAL HIGH (ref 65–99)
Potassium: 4.5 mmol/L (ref 3.5–5.2)
Sodium: 140 mmol/L (ref 134–144)

## 2019-03-25 NOTE — Patient Instructions (Signed)
Medication Instructions:  No changes *If you need a refill on your cardiac medications before your next appointment, please call your pharmacy*   Lab Work: Today: lipids, bmet, cbc  If you have labs (blood work) drawn today and your tests are completely normal, you will receive your results only by: Marland Kitchen MyChart Message (if you have MyChart) OR . A paper copy in the mail If you have any lab test that is abnormal or we need to change your treatment, we will call you to review the results.   Testing/Procedures: Your physician has requested that you have a carotid duplex. This test is an ultrasound of the carotid arteries in your neck. It looks at blood flow through these arteries that supply the brain with blood. Allow one hour for this exam. There are no restrictions or special instructions.   Follow-Up: At [facility], you and your health needs are our priority.  As part of our continuing mission to provide you with exceptional heart care, we have created designated Provider Care Teams.  These Care Teams include your primary Cardiologist (physician) and Advanced Practice Providers (APPs -  Physician Assistants and Nurse Practitioners) who all work together to provide you with the care you need, when you need it.  We recommend signing up for the patient portal called "MyChart".  Sign up information is provided on this After Visit Summary.  MyChart is used to connect with patients for Virtual Visits (Telemedicine).  Patients are able to view lab/test results, encounter notes, upcoming appointments, etc.  Non-urgent messages can be sent to your provider as well.   To learn more about what you can do with MyChart, go to [email or web address].    Your next appointment:   10-11 month(s)  The format for your next appointment:   Either In Person or Virtual  Provider:   You may see Dietrich Pates, MD or one of the following Advanced Practice Providers on your designated Care Team:    Tereso Newcomer, PA-C  [place], [place]  Berton Bon, [place]

## 2019-03-27 ENCOUNTER — Telehealth: Payer: Self-pay

## 2019-03-27 NOTE — Telephone Encounter (Signed)
The patient has been notified of the result and verbalized understanding.  All questions (if any) were answered. Leanord Hawking, RN 03/27/2019 8:33 AM

## 2019-03-27 NOTE — Telephone Encounter (Signed)
-----   Message from Pricilla Riffle, MD sent at 03/26/2019 10:39 AM EST ----- LIpids are excellent CBC is OK Electrolytes and kidney function are OK

## 2019-03-28 ENCOUNTER — Other Ambulatory Visit: Payer: Self-pay | Admitting: Internal Medicine

## 2019-04-03 ENCOUNTER — Other Ambulatory Visit: Payer: Self-pay | Admitting: Internal Medicine

## 2019-04-03 DIAGNOSIS — I679 Cerebrovascular disease, unspecified: Secondary | ICD-10-CM

## 2019-04-03 DIAGNOSIS — Z9889 Other specified postprocedural states: Secondary | ICD-10-CM

## 2019-04-17 ENCOUNTER — Ambulatory Visit (HOSPITAL_COMMUNITY): Payer: Medicare Other

## 2019-04-19 ENCOUNTER — Other Ambulatory Visit: Payer: Self-pay | Admitting: Internal Medicine

## 2019-04-25 DIAGNOSIS — H2512 Age-related nuclear cataract, left eye: Secondary | ICD-10-CM | POA: Diagnosis not present

## 2019-04-25 DIAGNOSIS — H25811 Combined forms of age-related cataract, right eye: Secondary | ICD-10-CM | POA: Diagnosis not present

## 2019-04-30 ENCOUNTER — Ambulatory Visit (HOSPITAL_COMMUNITY): Payer: Medicare Other

## 2019-06-18 ENCOUNTER — Ambulatory Visit (HOSPITAL_COMMUNITY)
Admission: RE | Admit: 2019-06-18 | Payer: Medicare Other | Source: Ambulatory Visit | Attending: Internal Medicine | Admitting: Internal Medicine

## 2019-06-22 ENCOUNTER — Other Ambulatory Visit: Payer: Self-pay | Admitting: Internal Medicine

## 2019-07-19 ENCOUNTER — Ambulatory Visit (HOSPITAL_COMMUNITY)
Admission: RE | Admit: 2019-07-19 | Payer: Medicare Other | Source: Ambulatory Visit | Attending: Internal Medicine | Admitting: Internal Medicine

## 2019-09-20 DIAGNOSIS — H25812 Combined forms of age-related cataract, left eye: Secondary | ICD-10-CM | POA: Diagnosis not present

## 2020-01-20 ENCOUNTER — Other Ambulatory Visit: Payer: Self-pay | Admitting: Internal Medicine

## 2020-03-22 ENCOUNTER — Other Ambulatory Visit: Payer: Self-pay | Admitting: Internal Medicine

## 2020-04-19 ENCOUNTER — Other Ambulatory Visit: Payer: Self-pay | Admitting: Internal Medicine

## 2020-04-22 ENCOUNTER — Other Ambulatory Visit: Payer: Self-pay | Admitting: Internal Medicine

## 2020-04-25 ENCOUNTER — Other Ambulatory Visit: Payer: Self-pay | Admitting: Internal Medicine

## 2020-04-28 DIAGNOSIS — R238 Other skin changes: Secondary | ICD-10-CM | POA: Diagnosis not present

## 2020-04-28 DIAGNOSIS — Z79899 Other long term (current) drug therapy: Secondary | ICD-10-CM | POA: Diagnosis not present

## 2020-04-28 DIAGNOSIS — I10 Essential (primary) hypertension: Secondary | ICD-10-CM | POA: Diagnosis not present

## 2020-04-28 DIAGNOSIS — E7800 Pure hypercholesterolemia, unspecified: Secondary | ICD-10-CM | POA: Diagnosis not present

## 2020-04-28 DIAGNOSIS — Z6821 Body mass index (BMI) 21.0-21.9, adult: Secondary | ICD-10-CM | POA: Diagnosis not present

## 2020-04-28 DIAGNOSIS — Z1331 Encounter for screening for depression: Secondary | ICD-10-CM | POA: Diagnosis not present

## 2020-04-28 DIAGNOSIS — Z9181 History of falling: Secondary | ICD-10-CM | POA: Diagnosis not present

## 2020-04-28 DIAGNOSIS — Z139 Encounter for screening, unspecified: Secondary | ICD-10-CM | POA: Diagnosis not present

## 2020-04-28 DIAGNOSIS — Z1211 Encounter for screening for malignant neoplasm of colon: Secondary | ICD-10-CM | POA: Diagnosis not present

## 2020-04-28 DIAGNOSIS — Z125 Encounter for screening for malignant neoplasm of prostate: Secondary | ICD-10-CM | POA: Diagnosis not present

## 2020-04-28 DIAGNOSIS — D692 Other nonthrombocytopenic purpura: Secondary | ICD-10-CM | POA: Diagnosis not present

## 2020-05-05 NOTE — Progress Notes (Deleted)
Cardiology Office Note   Date:  05/05/2020   ID:  DONA KLEMANN, DOB August 07, 1950, MRN [id number]  PCP:  Patient, No Pcp Per (Inactive)  Cardiologist:   Dietrich Pates, MD   F/U of CAD    History of Present Illness: [name] is a 70 y.o. male with a history of CAD (s/p PTCA/stent of the RCA, cutting angioplasty of the LAD in 2006), CV dz (s/p bilateral CEA) and dyslipidemia. I saw him in March 2021  Outpatient Medications Prior to Visit  Medication Sig Dispense Refill  . amLODipine (NORVASC) 5 MG tablet Take 1 tablet (5 mg total) by mouth daily. Please make overdue appt with Dr. Tenny Craw before anymore refills. Thank you 2nd attempt 15 tablet 0  . aspirin 81 MG tablet Take 81 mg by mouth daily.      . isosorbide mononitrate (IMDUR) 30 MG 24 hr tablet Take 1 tablet (30 mg total) by mouth daily. Please make overdue appt with Dr. Tenny Craw before anymore refills. Thank you 2nd attempt 15 tablet 0  . metoprolol succinate (TOPROL-XL) 25 MG 24 hr tablet Take 1 tablet (25 mg total) by mouth daily. Please make overdue appt with Dr. Tenny Craw before anymore refills. Thank you 2nd attempt 15 tablet 0  . simvastatin (ZOCOR) 40 MG tablet Take 1 tablet (40 mg total) by mouth daily at 6 PM. Please make overdue appt with Dr. Tenny Craw before anymore refills. Thank you 1st attempt 30 tablet 0   No facility-administered medications prior to visit.     Allergies:   Patient has no known allergies.   Past Medical History:  Diagnosis Date  . Cerebrovascular disease   . Coronary artery disease   . Dyslipidemia   . Hypertension   . Pain in joint, shoulder region     Past Surgical History:  Procedure Laterality Date  . CAROTID ENDARTERECTOMY     left/Jan. 2006/ right;Dacron patch angioplasty  . PTCA     stent placement January 2006     Social History:  The patient  reports that he quit smoking about 9 years ago. His smoking use included cigarettes. He has never used smokeless tobacco. He reports current  alcohol use.   Family History:  The patient's family history includes Alzheimer's disease in his mother; Heart disease in his father.    ROS:  Please see the history of present illness. All other systems are reviewed and  Negative to the above problem except as noted.    PHYSICAL EXAM: VS:  There were no vitals taken for this visit.  GEN: Thin 70 year old in no acute distress  HEENT: normal  Neck: JVP is not elevated  No , carotid bruits Cardiac: RRR; no murmurs, rubs, or gallops,no edema  Respiratory:  clear to auscultation bilaterally, normal work of breathing GI: soft, nontender, nondistended, + BS  No hepatomegaly  MS: no deformity Moving all extremities   Skin: warm and dry, no rash Neuro:  Strength and sensation are intact Psych: euthymic mood, full affect   EKG:  EKG is ordered today.  SR 83 bpm.   Lipid Panel    Component Value Date/Time   CHOL 168 03/25/2019 0923   TRIG 75 03/25/2019 0923   HDL 86 03/25/2019 0923   CHOLHDL 2.0 03/25/2019 0923   CHOLHDL 2.8 10/16/2015 0917   VLDL 20 10/16/2015 0917   LDLCALC 68 03/25/2019 0923      Wt Readings from Last 3 Encounters:  03/25/19 155 lb 12.8 oz (70.7 kg)  02/02/18 159 lb (72.1 kg)  12/23/16 168 lb (76.2 kg)      ASSESSMENT AND PLAN:  1  CAD the patient remains symptom-free.  No angina no shortness of breath.  He is very active.  Continue risk factor modification  2  HL  Check lipids   today.  3.  Hypertension adequate control.   4 cerebrovascular disease.  Patient is status post endarterectomies.  We will set up for carotid ultrasound.  This was not done last year because of Covid  5.  Covid infection.  Patient has not been sick.  We will try to help him get into vaccine program.  It is been difficult scheduling with work.  I told him that he needs to do this.  Check PSA, TSH  CBC, Lipids , CMET today    F/U in January February of next year.   Current medicines are reviewed at length with the  patient today.  The patient does not have concerns regarding medicines.     Signed, Dietrich Pates, MD  05/05/2020 10:53 PM    [facility] [address] [place], [place], [place]  [address] Phone: [phone]; Fax: [phone]

## 2020-05-06 ENCOUNTER — Ambulatory Visit: Payer: Medicare Other | Admitting: Internal Medicine

## 2020-05-06 ENCOUNTER — Telehealth: Payer: Self-pay | Admitting: Internal Medicine

## 2020-05-06 MED ORDER — AMLODIPINE BESYLATE 5 MG PO TABS: 5.0000 mg | ORAL_TABLET | Freq: Every day | ORAL | 0 refills | Status: DC

## 2020-05-06 MED ORDER — SIMVASTATIN 40 MG PO TABS: 40.0000 mg | ORAL_TABLET | Freq: Every day | ORAL | 0 refills | Status: DC

## 2020-05-06 MED ORDER — ISOSORBIDE MONONITRATE ER 30 MG PO TB24: 30.0000 mg | ORAL_TABLET | Freq: Every day | ORAL | 0 refills | Status: DC

## 2020-05-06 NOTE — Telephone Encounter (Signed)
Pt's medications were sent to pt's pharmacy as requested. Confirmation received.  

## 2020-05-06 NOTE — Telephone Encounter (Signed)
*  STAT* If patient is at the pharmacy, call can be transferred to refill team.   1. Which medications need to be refilled? (please list name of each medication and dose if known)  amLODipine (NORVASC) 5 MG tablet isosorbide mononitrate (IMDUR) 30 MG 24 hr tablet simvastatin (ZOCOR) 40 MG tablet  2. Which pharmacy/location (including street and city if local pharmacy) is medication to be sent to? [id number] [place], [place] - [address] [place] [facility]  3. Do they need a 30 day or 90 day supply? 90  PT has an appointment schedule for 07/14/2020. He is out of his medication. Please advise

## 2020-05-08 ENCOUNTER — Other Ambulatory Visit: Payer: Self-pay | Admitting: Internal Medicine

## 2020-07-08 NOTE — Progress Notes (Signed)
Cardiology Office Note    Date:  07/14/2020   ID:  [name], DOB Jan 27, 1950, MRN [id number]   PCP:  Patient, No Pcp Per (Inactive) Dr. Susie Cassette    [facility]  Cardiologist:  Dietrich Pates, MD   Advanced Practice Provider:  No care team member to display Electrophysiologist:  None   [id number]   Chief Complaint  Patient presents with   Follow-up    History of Present Illness:  [name] is a 70 y.o. male  with a history of CAD (s/p PTCA/stent of the RCA, cutting angioplasty of the LAD in 2006), CV dz (s/p bilateral CEA) and dyslipidemia.   Patient last saw Dr. Tenny Craw 03/2019 and was doing well.  Patient comes in for f/u. Complains of easy bruising. Having epigastric pain, vomiting usually in am for the past couple of weeks.Has lost 15 lbs. Loss of appetite, loose stools. Denies chest pain, dyspnea, dizziness or presyncope. 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  Past Medical History:  Diagnosis Date   Cerebrovascular disease    Coronary artery disease    Dyslipidemia    Hypertension    Pain in joint, shoulder region     Past Surgical History:  Procedure Laterality Date   CAROTID ENDARTERECTOMY     left/Jan. 2006/ right;Dacron patch angioplasty   PTCA     stent placement January 2006    Current Medications: Current Meds  Medication Sig   amLODipine (NORVASC) 5 MG tablet Take 1 tablet (5 mg total) by mouth daily. Please keep upcoming appt in June 2022 before anymore refills. Thank you Final Attempt   aspirin 81 MG tablet Take 81 mg by mouth daily.     isosorbide mononitrate (IMDUR) 30 MG 24 hr tablet Take 1 tablet (30 mg total) by mouth daily. Please keep upcoming appt in June 2022 before anymore refills. Thank you Final Attempt   metoprolol succinate (TOPROL-XL) 25 MG 24 hr tablet Take 1 tablet (25 mg total) by mouth daily. Please keep upcoming appt in June 2022 before anymore refills. Thank  you Final Attempt   simvastatin (ZOCOR) 40 MG tablet Take 1 tablet (40 mg total) by mouth daily at 6 PM. Please keep upcoming appt in June 2022 before anymore refills. Thank you Final attempt     Allergies:   Patient has no known allergies.   Social History   Socioeconomic History   Marital status: Widowed    Spouse name: Not on file   Number of children: Not on file   Years of education: Not on file   Highest education level: Not on file  Occupational History   Occupation: Production designer, theatre/television/film    Comment: [employer]  Tobacco Use   Smoking status: Former    Pack years: 0.00    Types: Cigarettes    Quit date: 01/25/2011    Years since quitting: 9.4   Smokeless tobacco: Never   Tobacco comments:    Printmaker  Substance and Sexual Activity   Alcohol use: Yes   Drug use: Not on file   Sexual activity: Not on file  Other Topics Concern   Not on file  Social History Narrative   Not on file   Social Determinants of Health   Financial Resource Strain: Not on file  Food Insecurity: Not on file  Transportation Needs: Not on file  Physical Activity: Not on file  Stress: Not on file  Social  Connections: Not on file     Family History:  The patient's  family history includes Alzheimer's disease in his mother; Heart disease in his father.   ROS:   Please see the history of present illness.    ROS All other systems reviewed and are negative.   PHYSICAL EXAM:   VS:  BP 118/78   Pulse 85   Ht 6' (1.829 m)   Wt 135 lb 6.4 oz (61.4 kg)   SpO2 99%   BMI 18.36 kg/m   Physical Exam  GEN: Thin, in no acute distress  Neck: no JVD, carotid bruits, or masses Cardiac:RRR; no murmurs, rubs, or gallops  Respiratory:  decreased breath sounds but clear to auscultation bilaterally, normal work of breathing GI: soft, nontender, nondistended, + BS Ext: arms with a lot of bruising lower ext without cyanosis, clubbing, or edema, Good distal pulses bilaterally Neuro:  Alert and  Oriented x 3, Psych: euthymic mood, full affect  Wt Readings from Last 3 Encounters:  07/14/20 135 lb 6.4 oz (61.4 kg)  03/25/19 155 lb 12.8 oz (70.7 kg)  02/02/18 159 lb (72.1 kg)      Studies/Labs Reviewed:   EKG:  EKG is  ordered today.  The ekg ordered today demonstrates NSR with nonspecific ST changes  Recent Labs: No results found for requested labs within last 8760 hours.   Lipid Panel    Component Value Date/Time   CHOL 168 03/25/2019 0923   TRIG 75 03/25/2019 0923   HDL 86 03/25/2019 0923   CHOLHDL 2.0 03/25/2019 0923   CHOLHDL 2.8 10/16/2015 0917   VLDL 20 10/16/2015 0917   LDLCALC 68 03/25/2019 0923    Additional studies/ records that were reviewed today include:      Risk Assessment/Calculations:         ASSESSMENT:    1. Coronary artery disease involving native coronary artery of native heart without angina pectoris   2. Essential hypertension   3. Mixed hyperlipidemia   4. CEREBROVASCULAR DISEASE   5. Weight loss   6. Epigastric abdominal pain   7. Anorexia      PLAN:  In order of problems listed above:  CAD status post PTCA and stenting of the RCA and cutting angioplasty of the LAD 2006 on ASA, Imdur, zocor, toprol no angina  Hypertension BP stable today but may drop with weight loss. Need to watch closely  Hyperlipidemia obtain labs from PCP  History of carotid disease status post bilateral carotid endarterectomy.  Unintentional weight loss, anorexia, epigastric pain, loose stools and easy bruising for the past several weeks.  Had lab work by PCP several weeks ago.  We will refer to gastroenterologist.   Shared Decision Making/Informed Consent        Medication Adjustments/Labs and Tests Ordered: Current medicines are reviewed at length with the patient today.  Concerns regarding medicines are outlined above.  Medication changes, Labs and Tests ordered today are listed in the Patient Instructions below. Patient Instructions   Medication Instructions:  Your physician recommends that you continue on your current medications as directed. Please refer to the Current Medication list given to you today.  *If you need a refill on your cardiac medications before your next appointment, please call your pharmacy*   Lab Work: CBC w/diff and a CMET today If you have labs (blood work) drawn today and your tests are completely normal, you will receive your results only by: MyChart Message (if you have MyChart) OR A paper copy in  the mail If you have any lab test that is abnormal or we need to change your treatment, we will call you to review the results.   Testing/Procedures: None   Follow-Up: At [facility], you and your health needs are our priority.  As part of our continuing mission to provide you with exceptional heart care, we have created designated Provider Care Teams.  These Care Teams include your primary Cardiologist (physician) and Advanced Practice Providers (APPs -  Physician Assistants and Nurse Practitioners) who all work together to provide you with the care you need, when you need it.  We recommend signing up for the patient portal called "MyChart".  Sign up information is provided on this After Visit Summary.  MyChart is used to connect with patients for Virtual Visits (Telemedicine).  Patients are able to view lab/test results, encounter notes, upcoming appointments, etc.  Non-urgent messages can be sent to your provider as well.   To learn more about what you can do with MyChart, go to [email or web address].    Your next appointment:   1 year(s)  The format for your next appointment:   In Person  Provider:   Dietrich Pates, MD   Other Instructions You have been referred to [facility] gastroenterology, they will be in contact with you to schedule an appointment to see them.     Elson Clan, PA-C  07/14/2020 10:43 AM    [facility] [address] [place], [place],  [place]  [address] Phone: [phone]; Fax: [phone]

## 2020-07-14 ENCOUNTER — Other Ambulatory Visit: Payer: Self-pay

## 2020-07-14 ENCOUNTER — Ambulatory Visit (INDEPENDENT_AMBULATORY_CARE_PROVIDER_SITE_OTHER): Payer: Medicare Other | Admitting: Physician Assistant

## 2020-07-14 ENCOUNTER — Encounter: Payer: Self-pay | Admitting: Physician Assistant

## 2020-07-14 VITALS — BP 118/78 | HR 85 | Ht 72.0 in | Wt 135.4 lb

## 2020-07-14 DIAGNOSIS — R634 Abnormal weight loss: Secondary | ICD-10-CM | POA: Diagnosis not present

## 2020-07-14 DIAGNOSIS — I10 Essential (primary) hypertension: Secondary | ICD-10-CM

## 2020-07-14 DIAGNOSIS — I2510 Atherosclerotic heart disease of native coronary artery without angina pectoris: Secondary | ICD-10-CM | POA: Diagnosis not present

## 2020-07-14 DIAGNOSIS — R630 Anorexia: Secondary | ICD-10-CM | POA: Diagnosis not present

## 2020-07-14 DIAGNOSIS — I679 Cerebrovascular disease, unspecified: Secondary | ICD-10-CM | POA: Diagnosis not present

## 2020-07-14 DIAGNOSIS — R1013 Epigastric pain: Secondary | ICD-10-CM

## 2020-07-14 DIAGNOSIS — E782 Mixed hyperlipidemia: Secondary | ICD-10-CM | POA: Diagnosis not present

## 2020-07-14 LAB — CBC WITH DIFFERENTIAL/PLATELET
Basophils Absolute: 0 10*3/uL (ref 0.0–0.2)
Basos: 0 %
EOS (ABSOLUTE): 0.1 10*3/uL (ref 0.0–0.4)
Eos: 1 %
Hematocrit: 40.4 % (ref 37.5–51.0)
Hemoglobin: 14 g/dL (ref 13.0–17.7)
Immature Grans (Abs): 0.1 10*3/uL (ref 0.0–0.1)
Immature Granulocytes: 1 %
Lymphocytes Absolute: 1.6 10*3/uL (ref 0.7–3.1)
Lymphs: 16 %
MCH: 34.3 pg — ABNORMAL HIGH (ref 26.6–33.0)
MCHC: 34.7 g/dL (ref 31.5–35.7)
MCV: 99 fL — ABNORMAL HIGH (ref 79–97)
Monocytes Absolute: 1.5 10*3/uL — ABNORMAL HIGH (ref 0.1–0.9)
Monocytes: 16 %
Neutrophils Absolute: 6.4 10*3/uL (ref 1.4–7.0)
Neutrophils: 66 %
Platelets: 267 10*3/uL (ref 150–450)
RBC: 4.08 x10E6/uL — ABNORMAL LOW (ref 4.14–5.80)
RDW: 11.5 % — ABNORMAL LOW (ref 11.6–15.4)
WBC: 9.6 10*3/uL (ref 3.4–10.8)

## 2020-07-14 LAB — COMPREHENSIVE METABOLIC PANEL
ALT: 28 IU/L (ref 0–44)
AST: 67 IU/L — ABNORMAL HIGH (ref 0–40)
Albumin/Globulin Ratio: 1.3 (ref 1.2–2.2)
Albumin: 3.8 g/dL (ref 3.7–4.7)
Alkaline Phosphatase: 119 IU/L (ref 44–121)
BUN/Creatinine Ratio: 10 (ref 10–24)
BUN: 7 mg/dL — ABNORMAL LOW (ref 8–27)
Bilirubin Total: 1.2 mg/dL (ref 0.0–1.2)
CO2: 26 mmol/L (ref 20–29)
Calcium: 9 mg/dL (ref 8.6–10.2)
Chloride: 97 mmol/L (ref 96–106)
Creatinine, Ser: 0.73 mg/dL — ABNORMAL LOW (ref 0.76–1.27)
Globulin, Total: 2.9 g/dL (ref 1.5–4.5)
Glucose: 118 mg/dL — ABNORMAL HIGH (ref 65–99)
Potassium: 3.7 mmol/L (ref 3.5–5.2)
Sodium: 138 mmol/L (ref 134–144)
Total Protein: 6.7 g/dL (ref 6.0–8.5)
eGFR: 97 mL/min/{1.73_m2} (ref >59)

## 2020-07-14 NOTE — Patient Instructions (Addendum)
Medication Instructions:  Your physician recommends that you continue on your current medications as directed. Please refer to the Current Medication list given to you today.  *If you need a refill on your cardiac medications before your next appointment, please call your pharmacy*   Lab Work: CBC w/diff and a CMET today If you have labs (blood work) drawn today and your tests are completely normal, you will receive your results only by: MyChart Message (if you have MyChart) OR A paper copy in the mail If you have any lab test that is abnormal or we need to change your treatment, we will call you to review the results.   Testing/Procedures: None   Follow-Up: At [facility], you and your health needs are our priority.  As part of our continuing mission to provide you with exceptional heart care, we have created designated Provider Care Teams.  These Care Teams include your primary Cardiologist (physician) and Advanced Practice Providers (APPs -  Physician Assistants and Nurse Practitioners) who all work together to provide you with the care you need, when you need it.  We recommend signing up for the patient portal called "MyChart".  Sign up information is provided on this After Visit Summary.  MyChart is used to connect with patients for Virtual Visits (Telemedicine).  Patients are able to view lab/test results, encounter notes, upcoming appointments, etc.  Non-urgent messages can be sent to your provider as well.   To learn more about what you can do with MyChart, go to [email or web address].    Your next appointment:   1 year(s)  The format for your next appointment:   In Person  Provider:   Dietrich Pates, MD   Other Instructions You have been referred to [facility] gastroenterology, they will be in contact with you to schedule an appointment to see them.

## 2020-07-15 ENCOUNTER — Telehealth: Payer: Self-pay | Admitting: Physician Assistant

## 2020-07-15 NOTE — Telephone Encounter (Signed)
° °  Pt is returning call to get lab result °

## 2020-07-22 NOTE — Telephone Encounter (Signed)
Call back received from Pt.  Pt given lab results.  Lab results sent to PCP.  Pt advised to call [facility] to schedule appt-referral has been sent.

## 2020-08-02 ENCOUNTER — Other Ambulatory Visit: Payer: Self-pay | Admitting: Internal Medicine

## 2020-08-03 ENCOUNTER — Other Ambulatory Visit: Payer: Self-pay | Admitting: Internal Medicine

## 2020-08-06 DIAGNOSIS — Z23 Encounter for immunization: Secondary | ICD-10-CM | POA: Diagnosis not present

## 2020-08-14 ENCOUNTER — Other Ambulatory Visit: Payer: Self-pay | Admitting: Internal Medicine

## 2020-10-14 DIAGNOSIS — Z1152 Encounter for screening for COVID-19: Secondary | ICD-10-CM | POA: Diagnosis not present

## 2020-10-26 NOTE — Addendum Note (Signed)
Addended by: Valrie Hart on: 10/26/2020 09:26 AM   Modules accepted: Orders

## 2021-01-30 ENCOUNTER — Emergency Department (HOSPITAL_COMMUNITY): Payer: Self-pay

## 2021-01-30 ENCOUNTER — Other Ambulatory Visit: Payer: Self-pay

## 2021-01-30 ENCOUNTER — Encounter: Payer: Self-pay | Admitting: Intensive Care

## 2021-01-30 ENCOUNTER — Emergency Department: Payer: Medicare Other

## 2021-01-30 ENCOUNTER — Inpatient Hospital Stay
Admission: EM | Admit: 2021-01-30 | Discharge: 2021-02-03 | DRG: 871 | Disposition: A | Discharge status: Skilled Nursing Facility | Payer: Medicare Other | Attending: Internal Medicine | Admitting: Internal Medicine

## 2021-01-30 DIAGNOSIS — J438 Other emphysema: Secondary | ICD-10-CM | POA: Diagnosis present

## 2021-01-30 DIAGNOSIS — E785 Hyperlipidemia, unspecified: Secondary | ICD-10-CM | POA: Diagnosis not present

## 2021-01-30 DIAGNOSIS — N390 Urinary tract infection, site not specified: Secondary | ICD-10-CM | POA: Diagnosis present

## 2021-01-30 DIAGNOSIS — J329 Chronic sinusitis, unspecified: Secondary | ICD-10-CM | POA: Diagnosis not present

## 2021-01-30 DIAGNOSIS — M4312 Spondylolisthesis, cervical region: Secondary | ICD-10-CM | POA: Diagnosis not present

## 2021-01-30 DIAGNOSIS — I2510 Atherosclerotic heart disease of native coronary artery without angina pectoris: Secondary | ICD-10-CM | POA: Diagnosis not present

## 2021-01-30 DIAGNOSIS — Z87891 Personal history of nicotine dependence: Secondary | ICD-10-CM

## 2021-01-30 DIAGNOSIS — Z20822 Contact with and (suspected) exposure to covid-19: Secondary | ICD-10-CM | POA: Diagnosis present

## 2021-01-30 DIAGNOSIS — N3001 Acute cystitis with hematuria: Secondary | ICD-10-CM | POA: Diagnosis not present

## 2021-01-30 DIAGNOSIS — E559 Vitamin D deficiency, unspecified: Secondary | ICD-10-CM | POA: Diagnosis not present

## 2021-01-30 DIAGNOSIS — Z681 Body mass index (BMI) 19 or less, adult: Secondary | ICD-10-CM | POA: Diagnosis not present

## 2021-01-30 DIAGNOSIS — W19XXXA Unspecified fall, initial encounter: Secondary | ICD-10-CM | POA: Diagnosis present

## 2021-01-30 DIAGNOSIS — R296 Repeated falls: Secondary | ICD-10-CM | POA: Diagnosis not present

## 2021-01-30 DIAGNOSIS — R627 Adult failure to thrive: Secondary | ICD-10-CM | POA: Diagnosis not present

## 2021-01-30 DIAGNOSIS — E872 Acidosis, unspecified: Secondary | ICD-10-CM | POA: Diagnosis present

## 2021-01-30 DIAGNOSIS — D539 Nutritional anemia, unspecified: Secondary | ICD-10-CM | POA: Diagnosis not present

## 2021-01-30 DIAGNOSIS — R404 Transient alteration of awareness: Secondary | ICD-10-CM | POA: Diagnosis not present

## 2021-01-30 DIAGNOSIS — Z79899 Other long term (current) drug therapy: Secondary | ICD-10-CM

## 2021-01-30 DIAGNOSIS — D509 Iron deficiency anemia, unspecified: Secondary | ICD-10-CM | POA: Diagnosis not present

## 2021-01-30 DIAGNOSIS — I499 Cardiac arrhythmia, unspecified: Secondary | ICD-10-CM | POA: Diagnosis not present

## 2021-01-30 DIAGNOSIS — G319 Degenerative disease of nervous system, unspecified: Secondary | ICD-10-CM | POA: Diagnosis not present

## 2021-01-30 DIAGNOSIS — E876 Hypokalemia: Secondary | ICD-10-CM | POA: Diagnosis not present

## 2021-01-30 DIAGNOSIS — E860 Dehydration: Secondary | ICD-10-CM | POA: Diagnosis present

## 2021-01-30 DIAGNOSIS — A419 Sepsis, unspecified organism: Principal | ICD-10-CM | POA: Diagnosis present

## 2021-01-30 DIAGNOSIS — Z7982 Long term (current) use of aspirin: Secondary | ICD-10-CM | POA: Diagnosis not present

## 2021-01-30 DIAGNOSIS — I714 Abdominal aortic aneurysm, without rupture, unspecified: Secondary | ICD-10-CM | POA: Diagnosis not present

## 2021-01-30 DIAGNOSIS — D7589 Other specified diseases of blood and blood-forming organs: Secondary | ICD-10-CM | POA: Diagnosis present

## 2021-01-30 DIAGNOSIS — E43 Unspecified severe protein-calorie malnutrition: Secondary | ICD-10-CM | POA: Diagnosis not present

## 2021-01-30 DIAGNOSIS — I6782 Cerebral ischemia: Secondary | ICD-10-CM | POA: Diagnosis not present

## 2021-01-30 DIAGNOSIS — R531 Weakness: Secondary | ICD-10-CM | POA: Diagnosis not present

## 2021-01-30 DIAGNOSIS — M5030 Other cervical disc degeneration, unspecified cervical region: Secondary | ICD-10-CM | POA: Diagnosis not present

## 2021-01-30 DIAGNOSIS — K760 Fatty (change of) liver, not elsewhere classified: Secondary | ICD-10-CM | POA: Diagnosis present

## 2021-01-30 DIAGNOSIS — I10 Essential (primary) hypertension: Secondary | ICD-10-CM | POA: Diagnosis present

## 2021-01-30 DIAGNOSIS — R6520 Severe sepsis without septic shock: Secondary | ICD-10-CM | POA: Diagnosis present

## 2021-01-30 DIAGNOSIS — E782 Mixed hyperlipidemia: Secondary | ICD-10-CM | POA: Diagnosis not present

## 2021-01-30 DIAGNOSIS — F1010 Alcohol abuse, uncomplicated: Secondary | ICD-10-CM | POA: Diagnosis present

## 2021-01-30 DIAGNOSIS — R0902 Hypoxemia: Secondary | ICD-10-CM | POA: Diagnosis not present

## 2021-01-30 DIAGNOSIS — W19XXXS Unspecified fall, sequela: Secondary | ICD-10-CM

## 2021-01-30 DIAGNOSIS — E538 Deficiency of other specified B group vitamins: Secondary | ICD-10-CM | POA: Diagnosis not present

## 2021-01-30 DIAGNOSIS — Z8673 Personal history of transient ischemic attack (TIA), and cerebral infarction without residual deficits: Secondary | ICD-10-CM

## 2021-01-30 DIAGNOSIS — R7401 Elevation of levels of liver transaminase levels: Secondary | ICD-10-CM

## 2021-01-30 DIAGNOSIS — I7143 Infrarenal abdominal aortic aneurysm, without rupture: Secondary | ICD-10-CM | POA: Diagnosis not present

## 2021-01-30 DIAGNOSIS — F17210 Nicotine dependence, cigarettes, uncomplicated: Secondary | ICD-10-CM | POA: Diagnosis not present

## 2021-01-30 DIAGNOSIS — Z743 Need for continuous supervision: Secondary | ICD-10-CM | POA: Diagnosis not present

## 2021-01-30 DIAGNOSIS — R7989 Other specified abnormal findings of blood chemistry: Secondary | ICD-10-CM | POA: Diagnosis not present

## 2021-01-30 DIAGNOSIS — J432 Centrilobular emphysema: Secondary | ICD-10-CM | POA: Diagnosis not present

## 2021-01-30 DIAGNOSIS — S0990XA Unspecified injury of head, initial encounter: Secondary | ICD-10-CM | POA: Diagnosis not present

## 2021-01-30 DIAGNOSIS — R5381 Other malaise: Secondary | ICD-10-CM | POA: Diagnosis not present

## 2021-01-30 DIAGNOSIS — R748 Abnormal levels of other serum enzymes: Secondary | ICD-10-CM | POA: Diagnosis not present

## 2021-01-30 DIAGNOSIS — J439 Emphysema, unspecified: Secondary | ICD-10-CM | POA: Diagnosis not present

## 2021-01-30 DIAGNOSIS — Y92009 Unspecified place in unspecified non-institutional (private) residence as the place of occurrence of the external cause: Secondary | ICD-10-CM | POA: Diagnosis present

## 2021-01-30 DIAGNOSIS — R079 Chest pain, unspecified: Secondary | ICD-10-CM | POA: Diagnosis not present

## 2021-01-30 LAB — HEPATIC FUNCTION PANEL
ALT: 83 U/L — ABNORMAL HIGH (ref 0–44)
AST: 105 U/L — ABNORMAL HIGH (ref 15–41)
Albumin: 2.8 g/dL — ABNORMAL LOW (ref 3.5–5.0)
Alkaline Phosphatase: 203 U/L — ABNORMAL HIGH (ref 38–126)
Bilirubin, Direct: 0.5 mg/dL — ABNORMAL HIGH (ref 0.0–0.2)
Indirect Bilirubin: 0.6 mg/dL (ref 0.3–0.9)
Total Bilirubin: 1.1 mg/dL (ref 0.3–1.2)
Total Protein: 7.2 g/dL (ref 6.5–8.1)

## 2021-01-30 LAB — URINALYSIS, ROUTINE W REFLEX MICROSCOPIC
Bilirubin Urine: NEGATIVE
Glucose, UA: NEGATIVE mg/dL
Ketones, ur: NEGATIVE mg/dL
Nitrite: NEGATIVE
Protein, ur: 30 mg/dL — AB
RBC / HPF: >50 RBC/hpf — ABNORMAL HIGH (ref 0–5)
Specific Gravity, Urine: 1.039 — ABNORMAL HIGH (ref 1.005–1.030)
WBC, UA: >50 WBC/hpf — ABNORMAL HIGH (ref 0–5)
pH: 6 (ref 5.0–8.0)

## 2021-01-30 LAB — BASIC METABOLIC PANEL
Anion gap: 10 (ref 5–15)
BUN: 11 mg/dL (ref 8–23)
CO2: 32 mmol/L (ref 22–32)
Calcium: 8.9 mg/dL (ref 8.9–10.3)
Chloride: 95 mmol/L — ABNORMAL LOW (ref 98–111)
Creatinine, Ser: 0.53 mg/dL — ABNORMAL LOW (ref 0.61–1.24)
GFR, Estimated: >60 mL/min (ref >60)
Glucose, Bld: 136 mg/dL — ABNORMAL HIGH (ref 70–99)
Potassium: 3.6 mmol/L (ref 3.5–5.1)
Sodium: 137 mmol/L (ref 135–145)

## 2021-01-30 LAB — CBC
HCT: 38.3 % — ABNORMAL LOW (ref 39.0–52.0)
Hemoglobin: 13.4 g/dL (ref 13.0–17.0)
MCH: 35.5 pg — ABNORMAL HIGH (ref 26.0–34.0)
MCHC: 35 g/dL (ref 30.0–36.0)
MCV: 101.6 fL — ABNORMAL HIGH (ref 80.0–100.0)
Platelets: 328 10*3/uL (ref 150–400)
RBC: 3.77 MIL/uL — ABNORMAL LOW (ref 4.22–5.81)
RDW: 12.9 % (ref 11.5–15.5)
WBC: 10.5 10*3/uL (ref 4.0–10.5)
nRBC: 0 % (ref 0.0–0.2)

## 2021-01-30 LAB — RESP PANEL BY RT-PCR (FLU A&B, COVID) ARPGX2
Influenza A by PCR: NEGATIVE
Influenza B by PCR: NEGATIVE
SARS Coronavirus 2 by RT PCR: NEGATIVE

## 2021-01-30 LAB — ETHANOL: Alcohol, Ethyl (B): <10 mg/dL (ref <10)

## 2021-01-30 LAB — TROPONIN I (HIGH SENSITIVITY)
Troponin I (High Sensitivity): 11 ng/L (ref <18)
Troponin I (High Sensitivity): 11 ng/L (ref <18)

## 2021-01-30 LAB — LACTIC ACID, PLASMA: Lactic Acid, Venous: 2.5 mmol/L (ref 0.5–1.9)

## 2021-01-30 MED ORDER — IOHEXOL 300 MG/ML  SOLN
80.0000 mL | Freq: Once | INTRAMUSCULAR | Status: AC | PRN
  Administered 2021-01-30: 80 mL via INTRAVENOUS

## 2021-01-30 MED ORDER — METOPROLOL TARTRATE 5 MG/5ML IV SOLN
2.5000 mg | Freq: Once | INTRAVENOUS | Status: DC
  Filled 2021-01-30: qty 5

## 2021-01-30 MED ORDER — SODIUM CHLORIDE 0.9 % IV BOLUS
1000.0000 mL | Freq: Once | INTRAVENOUS | Status: AC
  Administered 2021-01-30: 1000 mL via INTRAVENOUS

## 2021-01-30 MED ORDER — CEFTRIAXONE SODIUM 1 G IJ SOLR
1.0000 g | Freq: Once | INTRAMUSCULAR | Status: AC
  Administered 2021-01-30: 1 g via INTRAVENOUS
  Filled 2021-01-30: qty 10

## 2021-01-30 MED ORDER — HEPARIN SODIUM (PORCINE) 5000 UNIT/ML IJ SOLN
5000.0000 [IU] | Freq: Three times a day (TID) | INTRAMUSCULAR | Status: DC
  Administered 2021-01-30 – 2021-02-03 (×10): 5000 [IU] via SUBCUTANEOUS
  Filled 2021-01-30 (×10): qty 1

## 2021-01-30 MED ORDER — PANTOPRAZOLE SODIUM 40 MG IV SOLR
40.0000 mg | Freq: Two times a day (BID) | INTRAVENOUS | Status: DC
  Administered 2021-01-30 – 2021-01-31 (×2): 40 mg via INTRAVENOUS
  Filled 2021-01-30 (×2): qty 40

## 2021-01-30 MED ORDER — SODIUM CHLORIDE 0.9 % IV SOLN: Freq: Once | INTRAVENOUS | Status: DC

## 2021-01-30 MED ORDER — ACETAMINOPHEN 325 MG PO TABS: 650.0000 mg | ORAL_TABLET | Freq: Four times a day (QID) | ORAL | Status: DC | PRN

## 2021-01-30 MED ORDER — SODIUM CHLORIDE 0.9% FLUSH
3.0000 mL | Freq: Two times a day (BID) | INTRAVENOUS | Status: DC
  Administered 2021-01-30 – 2021-02-03 (×7): 3 mL via INTRAVENOUS

## 2021-01-30 MED ORDER — ASPIRIN EC 81 MG PO TBEC
81.0000 mg | DELAYED_RELEASE_TABLET | Freq: Every day | ORAL | Status: DC
  Administered 2021-01-31 – 2021-02-03 (×4): 81 mg via ORAL
  Filled 2021-01-30 (×4): qty 1

## 2021-01-30 MED ORDER — LACTATED RINGERS IV BOLUS
1000.0000 mL | Freq: Once | INTRAVENOUS | Status: AC
  Administered 2021-01-30: 1000 mL via INTRAVENOUS

## 2021-01-30 MED ORDER — THIAMINE HCL 100 MG/ML IJ SOLN
100.0000 mg | Freq: Every day | INTRAMUSCULAR | Status: DC
  Administered 2021-01-30 – 2021-01-31 (×2): 100 mg via INTRAVENOUS
  Filled 2021-01-30 (×2): qty 2

## 2021-01-30 MED ORDER — SODIUM CHLORIDE 0.9 % IV SOLN: Freq: Once | INTRAVENOUS | Status: AC

## 2021-01-30 MED ORDER — METOPROLOL SUCCINATE ER 50 MG PO TB24
25.0000 mg | ORAL_TABLET | Freq: Every day | ORAL | Status: DC
  Administered 2021-01-30: 25 mg via ORAL
  Filled 2021-01-30: qty 1

## 2021-01-30 MED ORDER — METOPROLOL TARTRATE 5 MG/5ML IV SOLN
5.0000 mg | Freq: Once | INTRAVENOUS | Status: AC
  Administered 2021-01-30: 5 mg via INTRAVENOUS

## 2021-01-30 MED ORDER — ACETAMINOPHEN 650 MG RE SUPP: 650.0000 mg | Freq: Four times a day (QID) | RECTAL | Status: DC | PRN

## 2021-01-30 NOTE — ED Provider Notes (Signed)
[facility] Provider Note    None    (approximate)   History   Failure To Thrive and Fall   HPI  [name] is a 71 y.o. male with a history of CVA as well as CAD and emphysema presents to the ER for frequent falls as well as poor p.o. intake and dehydration.  This been ongoing for several weeks.  He lives at home alone.  Is having almost daily falls.  States he just has no appetite denies any abdominal pain denies any chest pain.  EMS found patient had reportedly urinated on himself.  Patient had outpatient cardiology clinic appointment back in June had documented issues with anorexia at that time.     Physical Exam   Triage Vital Signs: ED Triage Vitals  Enc Vitals Group     BP 01/30/21 1043 95/81     Pulse Rate 01/30/21 1043 (!) 120     Resp 01/30/21 1043 14     Temp 01/30/21 1043 98 F (36.7 C)     Temp Source 01/30/21 1043 Oral     SpO2 01/30/21 1043 100 %     Weight 01/30/21 1048 100 lb (45.4 kg)     Height 01/30/21 1048 6' (1.829 m)     Head Circumference --      Peak Flow --      Pain Score 01/30/21 1047 3     Pain Loc --      Pain Edu? --      Excl. in [place]? --     Most recent vital signs: Vitals:   01/30/21 2156 01/30/21 2200  BP: 102/68 120/72  Pulse: (!) 139 (!) 149  Resp: 18 (!) 22  Temp: 98.2 F (36.8 C)   SpO2: 100% 98%     Constitutional: Alert frail and cachectic appearing Eyes: Conjunctivae are normal.  Head: Atraumatic. Nose: No congestion/rhinnorhea. Mouth/Throat: Mucous membranes are moist.   Neck: Painless ROM.  Cardiovascular:   Good peripheral circulation. Respiratory: Normal respiratory effort.  No retractions.  Gastrointestinal: Soft and nontender.  Musculoskeletal:  no deformity Neurologic:  MAE spontaneously. No gross focal neurologic deficits are appreciated.  Skin:  Skin is warm, dry and intact. No rash noted. Psychiatric: Mood and affect are normal. Speech and behavior are normal.    ED  Results / Procedures / Treatments   Labs (all labs ordered are listed, but only abnormal results are displayed) Labs Reviewed  BASIC METABOLIC PANEL - Abnormal; Notable for the following components:      Result Value   Chloride 95 (*)    Glucose, Bld 136 (*)    Creatinine, Ser 0.53 (*)    All other components within normal limits  CBC - Abnormal; Notable for the following components:   RBC 3.77 (*)    HCT 38.3 (*)    MCV 101.6 (*)    MCH 35.5 (*)    All other components within normal limits  URINALYSIS, ROUTINE W REFLEX MICROSCOPIC - Abnormal; Notable for the following components:   Color, Urine AMBER (*)    APPearance CLOUDY (*)    Specific Gravity, Urine 1.039 (*)    Hgb urine dipstick LARGE (*)    Protein, ur 30 (*)    Leukocytes,Ua LARGE (*)    RBC / HPF >50 (*)    WBC, UA >50 (*)    Bacteria, UA MANY (*)    Non Squamous Epithelial PRESENT (*)    All other components within  normal limits  HEPATIC FUNCTION PANEL - Abnormal; Notable for the following components:   Albumin 2.8 (*)    AST 105 (*)    ALT 83 (*)    Alkaline Phosphatase 203 (*)    Bilirubin, Direct 0.5 (*)    All other components within normal limits  LACTIC ACID, PLASMA - Abnormal; Notable for the following components:   Lactic Acid, Venous 2.5 (*)    All other components within normal limits  RESP PANEL BY RT-PCR (FLU A&B, COVID) ARPGX2  ETHANOL  PROTIME-INR  PROCALCITONIN  AMMONIA  GAMMA GT  CBC  COMPREHENSIVE METABOLIC PANEL  TROPONIN I (HIGH SENSITIVITY)  TROPONIN I (HIGH SENSITIVITY)     EKG  ED ECG REPORT I, Merlyn Lot, the attending physician, personally viewed and interpreted this ECG.   Date: 01/30/2021  EKG Time: 10:53  Rate: 125  Rhythm: sinus tach  Axis: normal  Intervals: noraml   ST&T Change: nonspecific st and t wave abn    RADIOLOGY Please see ED Course for my review and interpretation.  I personally reviewed all radiographic images ordered to evaluate for the  above acute complaints and reviewed radiology reports and findings.  These findings were personally discussed with the patient.  Please see medical record for radiology report.    PROCEDURES:  Critical Care performed:   Procedures   MEDICATIONS ORDERED IN ED: Medications  aspirin EC tablet 81 mg (has no administration in time range)  heparin injection 5,000 Units (5,000 Units Subcutaneous Given 01/30/21 2226)  sodium chloride flush (NS) 0.9 % injection 3 mL (3 mLs Intravenous Given 01/30/21 2228)  acetaminophen (TYLENOL) tablet 650 mg (has no administration in time range)    Or  acetaminophen (TYLENOL) suppository 650 mg (has no administration in time range)  thiamine (B-1) injection 100 mg (100 mg Intravenous Given 01/30/21 2225)  metoprolol tartrate (LOPRESSOR) injection 2.5 mg (0 mg Intravenous Hold 01/30/21 2233)  metoprolol succinate (TOPROL-XL) 24 hr tablet 25 mg (25 mg Oral Given 01/30/21 2230)  pantoprazole (PROTONIX) injection 40 mg (40 mg Intravenous Given 01/30/21 2223)  sodium chloride 0.9 % bolus 1,000 mL (0 mLs Intravenous Stopped 01/30/21 1939)  0.9 %  sodium chloride infusion (0 mLs Intravenous Stopped 01/30/21 2057)  iohexol (OMNIPAQUE) 300 MG/ML solution 80 mL (80 mLs Intravenous Contrast Given 01/30/21 1924)  cefTRIAXone (ROCEPHIN) 1 g in sodium chloride 0.9 % 100 mL IVPB (0 g Intravenous Stopped 01/30/21 2141)  lactated ringers bolus 1,000 mL (1,000 mLs Intravenous New Bag/Given 01/30/21 2141)  metoprolol tartrate (LOPRESSOR) injection 5 mg (5 mg Intravenous Given 01/30/21 2215)     IMPRESSION / MDM / ASSESSMENT AND PLAN / ED COURSE  I reviewed the triage vital signs and the nursing notes.                              Differential diagnosis includes, but is not limited to, dehydration, mass, sepsis, SDH, IPH, CVA, fracture  Presents to the ER for evaluation of symptoms as described above.  Patient frail and cachectic appearing.  Will give IV fluids.  CT imaging will be ordered for  above differential.  Having frequent falls.  I suspect some metabolic derangement chest x-ray by my read does not show evidence of pneumothorax.  CT head ordered.  Does have small superficial lacerations well approximated does not require repair.  Will give IV fluids.  Blood will be sent for the blood differential.  No significant  electrolyte abnormality does have mildly elevated lactate.  Will obtain urine.   Clinical Course as of 01/30/21 2312  Sat Jan 30, 2021  2042 Patient does have evidence of acute cystitis many bacteria and hematuria will order additional IV fluids as well as IV antibiotics.  Given his frequent falls dehydration mildly elevated lactate with SIRS criteria will discuss with hospitalist for admission [PR]    Clinical Course User Index [PR] Merlyn Lot, MD     FINAL CLINICAL IMPRESSION(S) / ED DIAGNOSES   Final diagnoses:  Acute cystitis with hematuria  Frequent falls  Dehydration     Rx / DC Orders   ED Discharge Orders     None        Note:  This document was prepared using Dragon voice recognition software and may include unintentional dictation errors.    Merlyn Lot, MD 01/30/21 2312

## 2021-01-30 NOTE — H&P (Signed)
History and Physical    [name] ZOX:[id number] DOB: 09-May-1950 DOA: 01/30/2021  PCP: Ailene RavelHamrickGuadalupe [name], Maura L, MD    Patient coming from:  Home    Chief Complaint:  Falls    HPI:  Omar DawnMike R [name] is a 71 y.o. male seen in ed with complaints of falls and decreased po intake lives alone.  Patient brought in for falls decreased p.o. intake, found to have a urinary tract infection patient is alert and awake and oriented and gives brief history.  Patient denies any complaints of headache blurred vision chest pain shortness of breath palpitation abdominal pain fevers chills. Pt is alert awake but somewhat disoriented, denies any complaints. Pt does not reports any chest pain sob,stomach issue, fever or chills.   Pt has past medical history of falls, cad, htn. ED Course:   Vitals:   01/30/21 1830 01/30/21 2030 01/30/21 2156 01/30/21 2200  BP: 134/89 (!) 126/92 102/68 120/72  Pulse: 65 94 (!) 139 (!) 149  Resp: 20 14 18  (!) 22  Temp:   98.2 F (36.8 C)   TempSrc:   Oral   SpO2: 90% 98% 100% 98%  Weight:      Height:      Patient in the emergency room meet sepsis criteria with tachycardia respiratory rate and urinary tract infection for which the patient was given Rocephin and IV fluid rehydration.  Blood work today showed glucose of 136 normal kidney function, abnormal LFTs with AST of 105 and ALT of 83 patient did have intermittent LFT elevations since 2020 but very mild.  Patient's lactic acid is 2.5, normal white count of 10.5 hemoglobin 13.4 MCV of 101.6, Respiratory panel negative for flu and COVID.  Urine shows cloudy large hemoglobin large leukocytes many bacteria more than 50 WBCs along with hematuria.  Review of Systems:  Review of Systems  Unable to perform ROS: Acuity of condition  Cardiovascular: Negative.   Neurological:  Positive for weakness.  All other systems reviewed and are negative.   Past Medical History:  Diagnosis Date   Cerebrovascular disease    Coronary  artery disease    Dyslipidemia    Hypertension    Pain in joint, shoulder region     Past Surgical History:  Procedure Laterality Date   CAROTID ENDARTERECTOMY     left/Jan. 2006/ right;Dacron patch angioplasty   PTCA     stent placement January 2006     reports that he has been smoking e-cigarettes. He has never used smokeless tobacco. He reports current alcohol use of about 21.0 standard drinks per week. He reports that he does not use drugs.  No Known Allergies  Family History  Problem Relation Age of Onset   Alzheimer's disease Mother    Heart disease Father     Prior to Admission medications   Medication Sig Start Date End Date Taking? Authorizing Provider  amLODipine (NORVASC) 5 MG tablet TAKE 1 TABLET (5 MG TOTAL) BY MOUTH DAILY. 08/03/20   Pricilla Riffleoss, Paula V, MD  aspirin 81 MG tablet Take 81 mg by mouth daily.      [provider]  isosorbide mononitrate (IMDUR) 30 MG 24 hr tablet Take 1 tablet (30 mg total) by mouth daily. 08/03/20   Pricilla Riffleoss, Paula V, MD  metoprolol succinate (TOPROL-XL) 25 MG 24 hr tablet TAKE 1 TABLET BY MOUTH DAILY. PLEASE KEEP UPCOMING APPT IN JUNE 2022 BEFORE ANYMORE REFILLS. 08/14/20   Pricilla Riffleoss, Paula V, MD  simvastatin (ZOCOR) 40 MG tablet Take 1  tablet (40 mg total) by mouth daily. at 6pm 08/03/20   Pricilla Riffle, MD    Physical Exam: Vitals:   01/30/21 1830 01/30/21 2030 01/30/21 2156 01/30/21 2200  BP: 134/89 (!) 126/92 102/68 120/72  Pulse: 65 94 (!) 139 (!) 149  Resp: (!) 22  Temp:   98.2 F (36.8 C)   TempSrc:   Oral   SpO2: 90% 98% 100% 98%  Weight:      Height:       Physical Exam Vitals reviewed.  Constitutional:      General: He is not in acute distress.    Appearance: He is ill-appearing.  HENT:     Head: Normocephalic and atraumatic.     Right Ear: External ear normal.     Left Ear: External ear normal.     Nose: Nose normal.  Eyes:     Extraocular Movements: Extraocular movements intact.  Cardiovascular:      Rate and Rhythm: Normal rate and regular rhythm.     Pulses: Normal pulses.     Heart sounds: Normal heart sounds.  Pulmonary:     Effort: Pulmonary effort is normal.     Breath sounds: Normal breath sounds.  Abdominal:     General: Bowel sounds are normal. There is no distension.     Palpations: Abdomen is soft. There is no mass.     Tenderness: There is no abdominal tenderness. There is no guarding.     Hernia: No hernia is present.  Musculoskeletal:     Right lower leg: No edema.     Left lower leg: No edema.  Skin:    General: Skin is warm.  Neurological:     General: No focal deficit present.     Mental Status: He is alert and oriented to person, place, and time.  Psychiatric:        Behavior: Behavior normal.    Labs on Admission: I have personally reviewed following labs and imaging studies  No results for input(s): CKTOTAL, CKMB, TROPONINI in the last 72 hours. Lab Results  Component Value Date   WBC 10.5 01/30/2021   HGB 13.4 01/30/2021   HCT 38.3 (L) 01/30/2021   MCV 101.6 (H) 01/30/2021   PLT 328 01/30/2021    Recent Labs  Lab 01/30/21 1054 01/30/21 1646  NA 137  --   K 3.6  --   CL 95*  --   CO2 32  --   BUN 11  --   CREATININE 0.53*  --   CALCIUM 8.9  --   PROT  --  7.2  BILITOT  --  1.1  ALKPHOS  --  203*  ALT  --  83*  AST  --  105*  GLUCOSE 136*  --    Lab Results  Component Value Date   CHOL 168 03/25/2019   HDL 86 03/25/2019   LDLCALC 68 03/25/2019   TRIG 75 03/25/2019   No results found for: DDIMER Invalid input(s): POCBNP   COVID-19 Labs No results for input(s): DDIMER, FERRITIN, LDH, CRP in the last 72 hours. Lab Results  Component Value Date   SARSCOV2NAA NEGATIVE 01/30/2021    Radiological Exams on Admission: CT HEAD WO CONTRAST  Result Date: 01/30/2021 CLINICAL DATA:  Head trauma, minor.  Fall. EXAM: CT HEAD WITHOUT CONTRAST TECHNIQUE: Contiguous axial images were obtained from the base of the skull through the vertex  without intravenous contrast. COMPARISON:  No recent examination available for comparison.  MRI examination dated January 31, 2004. FINDINGS: Brain: No evidence of acute infarction, hemorrhage, hydrocephalus, extra-axial collection or mass lesion/mass effect. Mild cerebral atrophy and chronic microvascular ischemic changes of the white matter. Vascular: No hyperdense vessel or unexpected calcification. Skull: Normal. Negative for fracture or focal lesion. Sinuses/Orbits: Hyperostosis of the left maxillary sinus, likely secondary to chronic sinus disease. Opacification of the left sphenoid sinus. Other: None. IMPRESSION: 1. No acute intracranial abnormality. 2. Mild cerebral volume loss and chronic microvascular ischemic changes of the white matter. 3. Chronic paranasal sinus disease. Electronically Signed   By: Keane Police D.O.   On: 01/30/2021 11:43   CT Cervical Spine Wo Contrast  Result Date: 01/30/2021 CLINICAL DATA:  Multiple falls. EXAM: CT CERVICAL SPINE WITHOUT CONTRAST TECHNIQUE: Multidetector CT imaging of the cervical spine was performed without intravenous contrast. Multiplanar CT image reconstructions were also generated. COMPARISON:  None. FINDINGS: Alignment: There is mild retrolisthesis C5 on C6, likely related to chronic spondylosis. The alignment is otherwise normal. Skull base and vertebrae: No acute fracture. No primary bone lesion or focal pathologic process. Soft tissues and spinal canal: No prevertebral fluid or swelling. No visible canal hematoma. Disc levels: Marked disc space narrowing and endplate spurring identified at C4-5 C5-6 and C6-7. Upper chest: Advanced changes of paraseptal and centrilobular emphysema noted. Other: None IMPRESSION: 1. No evidence for cervical spine fracture. 2. Advanced cervical degenerative disc disease. 3. Emphysema (ICD10-J43.9). Electronically Signed   By: Kerby Moors M.D.   On: 01/30/2021 14:37   CT ABDOMEN PELVIS W CONTRAST  Result Date:  01/30/2021 CLINICAL DATA:  Nausea, vomiting EXAM: CT ABDOMEN AND PELVIS WITH CONTRAST TECHNIQUE: Multidetector CT imaging of the abdomen and pelvis was performed using the standard protocol following bolus administration of intravenous contrast. CONTRAST:  28mL OMNIPAQUE IOHEXOL 300 MG/ML  SOLN COMPARISON:  None. FINDINGS: Lower chest: Aortic and coronary artery calcifications. No confluent opacities or effusions. Hepatobiliary: Diffuse heterogeneous low-density throughout the liver compatible with geographic fatty infiltration. Gallbladder unremarkable. Pancreas: No focal abnormality or ductal dilatation. Spleen: No focal abnormality.  Normal size. Adrenals/Urinary Tract: No adrenal abnormality. No focal renal abnormality. No stones or hydronephrosis. Urinary bladder is unremarkable. Stomach/Bowel: Scattered left colonic diverticulosis. No active diverticulitis. Stomach and small bowel decompressed. No bowel obstruction. Vascular/Lymphatic: 4.4 cm infrarenal abdominal aortic aneurysm with extensive mural plaque. No adenopathy. Reproductive: No visible focal abnormality. Other: No free fluid or free air. Musculoskeletal: No acute bony abnormality IMPRESSION: Severe hepatic steatosis. 4.4 cm infrarenal abdominal aortic aneurysm. Coronary artery disease, aortic atherosclerosis. Electronically Signed   By: Rolm Baptise M.D.   On: 01/30/2021 19:37   DG Chest Portable 1 View  Result Date: 01/30/2021 CLINICAL DATA:  Weakness EXAM: PORTABLE CHEST 1 VIEW COMPARISON:  06/01/2006 FINDINGS: Hyperinflation with emphysema. Mild bronchitic changes at the bases. No consolidation, pleural effusion, or pneumothorax. Multiple chronic left-sided rib fractures. IMPRESSION: No active disease.  Emphysema and bronchitic changes Electronically Signed   By: Donavan Foil M.D.   On: 01/30/2021 17:30    EKG: Independently reviewed.  Sinus tach 125 with significant Q waves in V1 V2 and V3, unchanged from prior  EKG.   Assessment/Plan: Principal Problem:   Falls Active Problems:   UTI (urinary tract infection)   Severe sepsis (HCC)   Abnormal LFTs   Essential hypertension   Coronary artery disease   Falls: Patient brought in for falls found to have urinary tract infection generalized weakness failure to thrive multiple complaints.  Will admit patient to  him medical floor with fall precautions aspiration precautions. Will obtain a B12 level to rule out neuropathy predisposing to patient's falls and as patient also has macrocytosis.  Alcohol level check, neuro check  Urinary tract infection/severe sepsis: Patient meeting severe sepsis criteria with hypotension lactic acidosis is which is corrected with IV fluid rehydration. We will continue patient on Rocephin and follow blood cultures and urine cultures. Additional supportive care with antipyretics antiemetics IV PPI therapy.  Abnormal LFTs: Contribute to either sepsis, we will obtain an acute hepatitis panel.  This could also be secondary to his  statin therapy.  Hypertension: Blood pressure 120/72, pulse (!) 149, temperature 98.2 F (36.8 C), temperature source Oral, resp. rate (!) 22, height 6' (1.829 m), weight 45.4 kg, SpO2 98 %. Initial hypotension has responded well to IV fluids.     CAD: Stable denies any chest pains. Continue patient on his home regimen of metoprolol. We will continue patient on the metoprolol for his sinus tach.  DVT prophylaxis:  Heparin  Code Status:  Full code  Family Communication:  Rose, Baire Occupational hygienist)  [phone] (Home Phone)   Disposition Plan:  Nursing facility  Consults called:  None  Admission status: Inpatient   Para Skeans MD [facility]  6 PM- 2 AM. Please contact me via secure Chat 6 PM-2 AM. How to contact the [facility] Attending or Consulting provider [place] or covering provider during after hours [place], for this patient.   Check the care team in [facility] and look for a)  attending/consulting [facility] provider listed and b) the [facility] team listed Log into [email or web address] and use [place]'s universal password to access. If you do not have the password, please contact the hospital operator. Locate the [facility] provider you are looking for under [facility] and page to a number that you can be directly reached. If you still have difficulty reaching the provider, please page the [facility] (Director on Call) for the Hospitalists listed on amion for assistance. [email or web address] 01/30/2021, 11:31 PM

## 2021-01-30 NOTE — ED Triage Notes (Signed)
Patient arrived by EMS from home for multiple falls, not eating or drinking longer than 3-4 weeks(per brother in triage), and weak. Patient reports his knees go out when trying to ambulate. Patient has lac to head with dried blood present. Alert to self, place, situation. Disoriented to time

## 2021-01-30 NOTE — ED Notes (Signed)
Attempted in and out catheterization. Unsuccessful. MD notified and will give 1L bolus and re-attempt to collect urine specimen.

## 2021-01-30 NOTE — ED Notes (Signed)
Pt given urinal and instructed to press call light when urine is available.

## 2021-01-30 NOTE — ED Triage Notes (Signed)
Pt in via EMS from home with c/o weakness and multiple falls. Pt fell last pm as well and has small lac to head. Pt reports really weak. EMS reports pt with strong urine odor

## 2021-01-31 ENCOUNTER — Inpatient Hospital Stay (HOSPITAL_COMMUNITY)
Admit: 2021-01-31 | Discharge: 2021-01-31 | Disposition: A | Discharge status: Home / Self Care | Payer: Medicare Other | Attending: Student | Admitting: Student

## 2021-01-31 ENCOUNTER — Inpatient Hospital Stay: Payer: Medicare Other

## 2021-01-31 DIAGNOSIS — R079 Chest pain, unspecified: Secondary | ICD-10-CM | POA: Diagnosis not present

## 2021-01-31 DIAGNOSIS — W19XXXA Unspecified fall, initial encounter: Secondary | ICD-10-CM | POA: Diagnosis present

## 2021-01-31 LAB — COMPREHENSIVE METABOLIC PANEL
ALT: 57 U/L — ABNORMAL HIGH (ref 0–44)
ALT: 59 U/L — ABNORMAL HIGH (ref 0–44)
AST: 83 U/L — ABNORMAL HIGH (ref 15–41)
AST: 100 U/L — ABNORMAL HIGH (ref 15–41)
Albumin: 1.9 g/dL — ABNORMAL LOW (ref 3.5–5.0)
Albumin: 2.5 g/dL — ABNORMAL LOW (ref 3.5–5.0)
Alkaline Phosphatase: 179 U/L — ABNORMAL HIGH (ref 38–126)
Alkaline Phosphatase: 158 U/L — ABNORMAL HIGH (ref 38–126)
Anion gap: 8 (ref 5–15)
Anion gap: 7 (ref 5–15)
BUN: 11 mg/dL (ref 8–23)
BUN: 10 mg/dL (ref 8–23)
CO2: 24 mmol/L (ref 22–32)
CO2: 23 mmol/L (ref 22–32)
Calcium: 7.8 mg/dL — ABNORMAL LOW (ref 8.9–10.3)
Calcium: 7.8 mg/dL — ABNORMAL LOW (ref 8.9–10.3)
Chloride: 106 mmol/L (ref 98–111)
Chloride: 105 mmol/L (ref 98–111)
Creatinine, Ser: 0.68 mg/dL (ref 0.61–1.24)
Creatinine, Ser: 0.73 mg/dL (ref 0.61–1.24)
GFR, Estimated: >60 mL/min (ref >60)
GFR, Estimated: >60 mL/min (ref >60)
Glucose, Bld: 92 mg/dL (ref 70–99)
Glucose, Bld: 77 mg/dL (ref 70–99)
Potassium: 2.8 mmol/L — ABNORMAL LOW (ref 3.5–5.1)
Potassium: 3.6 mmol/L (ref 3.5–5.1)
Sodium: 138 mmol/L (ref 135–145)
Sodium: 135 mmol/L (ref 135–145)
Total Bilirubin: 1.3 mg/dL — ABNORMAL HIGH (ref 0.3–1.2)
Total Bilirubin: 0.8 mg/dL (ref 0.3–1.2)
Total Protein: 4.9 g/dL — ABNORMAL LOW (ref 6.5–8.1)
Total Protein: 5.7 g/dL — ABNORMAL LOW (ref 6.5–8.1)

## 2021-01-31 LAB — ECHOCARDIOGRAM COMPLETE
AV Peak grad: 7.8 mmHg
Ao pk vel: 1.4 m/s
Area-P 1/2: 4.39 cm2
Height: 72 in
S' Lateral: 3.2 cm
Weight: 1600 oz

## 2021-01-31 LAB — CBC
HCT: 30.1 % — ABNORMAL LOW (ref 39.0–52.0)
Hemoglobin: 10.3 g/dL — ABNORMAL LOW (ref 13.0–17.0)
MCH: 35.4 pg — ABNORMAL HIGH (ref 26.0–34.0)
MCHC: 34.2 g/dL (ref 30.0–36.0)
MCV: 103.4 fL — ABNORMAL HIGH (ref 80.0–100.0)
Platelets: 211 10*3/uL (ref 150–400)
RBC: 2.91 MIL/uL — ABNORMAL LOW (ref 4.22–5.81)
RDW: 13.2 % (ref 11.5–15.5)
WBC: 22 10*3/uL — ABNORMAL HIGH (ref 4.0–10.5)
nRBC: 0 % (ref 0.0–0.2)

## 2021-01-31 LAB — MAGNESIUM: Magnesium: 1.6 mg/dL — ABNORMAL LOW (ref 1.7–2.4)

## 2021-01-31 LAB — PHOSPHORUS: Phosphorus: 3.5 mg/dL (ref 2.5–4.6)

## 2021-01-31 LAB — PROTIME-INR
INR: 1.1 (ref 0.8–1.2)
Prothrombin Time: 14.4 seconds (ref 11.4–15.2)

## 2021-01-31 LAB — AMMONIA: Ammonia: 15 umol/L (ref 9–35)

## 2021-01-31 LAB — IRON AND TIBC
Iron: 29 ug/dL — ABNORMAL LOW (ref 45–182)
Saturation Ratios: 25 % (ref 17.9–39.5)
TIBC: 116 ug/dL — ABNORMAL LOW (ref 250–450)
UIBC: 87 ug/dL

## 2021-01-31 LAB — FOLATE: Folate: 3.1 ng/mL — ABNORMAL LOW (ref >5.9)

## 2021-01-31 LAB — HEPATITIS PANEL, ACUTE
HCV Ab: NONREACTIVE
Hep A IgM: NONREACTIVE
Hep B C IgM: NONREACTIVE
Hepatitis B Surface Ag: NONREACTIVE

## 2021-01-31 LAB — GAMMA GT: GGT: 415 U/L — ABNORMAL HIGH (ref 7–50)

## 2021-01-31 LAB — VITAMIN D 25 HYDROXY (VIT D DEFICIENCY, FRACTURES): Vit D, 25-Hydroxy: 30.66 ng/mL (ref 30–100)

## 2021-01-31 LAB — LACTIC ACID, PLASMA: Lactic Acid, Venous: 2.2 mmol/L (ref 0.5–1.9)

## 2021-01-31 LAB — TSH: TSH: 0.988 u[IU]/mL (ref 0.350–4.500)

## 2021-01-31 LAB — VITAMIN B12: Vitamin B-12: 328 pg/mL (ref 180–914)

## 2021-01-31 LAB — PROCALCITONIN: Procalcitonin: 24.14 ng/mL

## 2021-01-31 MED ORDER — ONDANSETRON HCL 4 MG/2ML IJ SOLN: 4.0000 mg | Freq: Four times a day (QID) | INTRAMUSCULAR | Status: DC | PRN

## 2021-01-31 MED ORDER — ALBUMIN HUMAN 25 % IV SOLN
25.0000 g | Freq: Once | INTRAVENOUS | Status: AC
  Administered 2021-02-01: 25 g via INTRAVENOUS
  Filled 2021-01-31: qty 100

## 2021-01-31 MED ORDER — POTASSIUM CHLORIDE 10 MEQ/100ML IV SOLN
10.0000 meq | INTRAVENOUS | Status: AC
  Administered 2021-01-31 (×5): 10 meq via INTRAVENOUS
  Filled 2021-01-31 (×5): qty 100

## 2021-01-31 MED ORDER — SODIUM CHLORIDE 0.9 % IV SOLN: INTRAVENOUS | Status: DC

## 2021-01-31 MED ORDER — ENSURE ENLIVE PO LIQD
237.0000 mL | Freq: Three times a day (TID) | ORAL | Status: DC
  Administered 2021-02-01 – 2021-02-03 (×6): 237 mL via ORAL

## 2021-01-31 MED ORDER — VITAMIN B-12 1000 MCG PO TABS
500.0000 ug | ORAL_TABLET | Freq: Every day | ORAL | Status: DC
  Administered 2021-01-31 – 2021-02-03 (×4): 500 ug via ORAL
  Filled 2021-01-31 (×4): qty 1

## 2021-01-31 MED ORDER — SODIUM CHLORIDE 0.9 % IV BOLUS
250.0000 mL | Freq: Once | INTRAVENOUS | Status: AC
  Administered 2021-01-31: 250 mL via INTRAVENOUS

## 2021-01-31 MED ORDER — MIDODRINE HCL 5 MG PO TABS
5.0000 mg | ORAL_TABLET | Freq: Once | ORAL | Status: AC
  Administered 2021-01-31: 5 mg via ORAL
  Filled 2021-01-31: qty 1

## 2021-01-31 MED ORDER — SODIUM CHLORIDE 0.9 % IV SOLN
1.0000 g | INTRAVENOUS | Status: DC
  Administered 2021-01-31 – 2021-02-02 (×3): 1 g via INTRAVENOUS
  Filled 2021-01-31 (×5): qty 10

## 2021-01-31 MED ORDER — SODIUM CHLORIDE 0.9 % IV BOLUS
1000.0000 mL | Freq: Once | INTRAVENOUS | Status: AC
  Administered 2021-01-31: 1000 mL via INTRAVENOUS

## 2021-01-31 MED ORDER — POLYSACCHARIDE IRON COMPLEX 150 MG PO CAPS
150.0000 mg | ORAL_CAPSULE | Freq: Every day | ORAL | Status: DC
  Administered 2021-01-31 – 2021-02-03 (×3): 150 mg via ORAL
  Filled 2021-01-31 (×4): qty 1

## 2021-01-31 MED ORDER — MAGNESIUM SULFATE 2 GM/50ML IV SOLN
2.0000 g | Freq: Once | INTRAVENOUS | Status: AC
  Administered 2021-01-31: 2 g via INTRAVENOUS
  Filled 2021-01-31: qty 50

## 2021-01-31 MED ORDER — ASCORBIC ACID 500 MG PO TABS
500.0000 mg | ORAL_TABLET | Freq: Every day | ORAL | Status: DC
  Administered 2021-01-31 – 2021-02-03 (×4): 500 mg via ORAL
  Filled 2021-01-31 (×4): qty 1

## 2021-01-31 MED ORDER — THIAMINE HCL 100 MG PO TABS
100.0000 mg | ORAL_TABLET | Freq: Every day | ORAL | Status: DC
  Administered 2021-02-01 – 2021-02-03 (×3): 100 mg via ORAL
  Filled 2021-01-31 (×3): qty 1

## 2021-01-31 MED ORDER — ALBUMIN HUMAN 25 % IV SOLN
25.0000 g | Freq: Once | INTRAVENOUS | Status: AC
  Administered 2021-01-31: 25 g via INTRAVENOUS
  Filled 2021-01-31: qty 100

## 2021-01-31 MED ORDER — PANTOPRAZOLE SODIUM 40 MG PO TBEC
40.0000 mg | DELAYED_RELEASE_TABLET | Freq: Every day | ORAL | Status: DC
  Administered 2021-02-01 – 2021-02-03 (×3): 40 mg via ORAL
  Filled 2021-01-31 (×3): qty 1

## 2021-01-31 MED ORDER — POTASSIUM CHLORIDE CRYS ER 20 MEQ PO TBCR: 40.0000 meq | EXTENDED_RELEASE_TABLET | Freq: Once | ORAL | Status: DC

## 2021-01-31 MED ORDER — VITAMIN D (ERGOCALCIFEROL) 1.25 MG (50000 UNIT) PO CAPS
50000.0000 [IU] | ORAL_CAPSULE | ORAL | Status: DC
  Administered 2021-01-31: 50000 [IU] via ORAL
  Filled 2021-01-31: qty 1

## 2021-01-31 MED ORDER — FOLIC ACID 1 MG PO TABS
1.0000 mg | ORAL_TABLET | Freq: Every day | ORAL | Status: DC
  Administered 2021-01-31 – 2021-02-03 (×4): 1 mg via ORAL
  Filled 2021-01-31 (×4): qty 1

## 2021-01-31 NOTE — ED Notes (Signed)
Bed sheet and linens changed.

## 2021-01-31 NOTE — ED Notes (Signed)
Lab at bedside.  Echo also at bedside.

## 2021-01-31 NOTE — Progress Notes (Signed)
*  PRELIMINARY RESULTS* Echocardiogram 2D Echocardiogram has been performed.  [name] 01/31/2021, 11:55 AM

## 2021-01-31 NOTE — ED Notes (Signed)
Pt assisted to toilet 

## 2021-01-31 NOTE — ED Notes (Signed)
Provider messaged regarding pt's BP.

## 2021-01-31 NOTE — ED Notes (Addendum)
Pt given lunch tray. Urinal emptied of 100 ml's urine. Pt is alert and oriented x4, NAD noted, pt denies further needs at this time.

## 2021-01-31 NOTE — Progress Notes (Signed)
[facility] Progress Note  Patient: Guadalupe DawnMike R Laflamme    ZOX:[id number]  DOA: 01/30/2021     Date of Service: the patient was seen and examined on 01/31/2021  Chief Complaint  Patient presents with   Failure To Thrive   Fall   Brief hospital course:[name] R Reuel BoomDaniel is a 71 y.o. male with past medical history of HTN, HLD, CAD, CVA, seen in ed with complaints of falls and decreased po intake lives alone.  Patient brought in for falls decreased p.o. intake, found to have a urinary tract infection patient is alert and awake and oriented and gives brief history.  Patient denies any complaints of headache blurred vision chest pain shortness of breath palpitation abdominal pain fevers chills. Pt is alert awake but somewhat disoriented, denies any complaints. Pt does not reports any chest pain sob,stomach issue, fever or chills.   Patient in the emergency room meet sepsis criteria with tachycardia respiratory rate and urinary tract infection for which the patient was given Rocephin and IV fluid rehydration.  Blood work today showed glucose of 136 normal kidney function, abnormal LFTs with AST of 105 and ALT of 83 patient did have intermittent LFT elevations since 2020 but very mild.  Patient's lactic acid is 2.5, normal white count of 10.5 hemoglobin 13.4 MCV of 101.6, Respiratory panel negative for flu and COVID.  Urine shows cloudy large hemoglobin large leukocytes many bacteria more than 50 WBCs along with hematuria    Assessment and Plan: Principal Problem:   Falls Active Problems:   UTI (urinary tract infection)   Severe sepsis (HCC)   Abnormal LFTs   Essential hypertension   Coronary artery disease   Sepsis secondary to UTI Patient meeting severe sepsis criteria with hypotension lactic acidosis is which is corrected with IV fluid rehydration.  Elevated WBC count and procalcitonin continue Rocephin  follow blood cultures and urine cultures.   Transaminitis most likely secondary to sepsis  and hypotension CT abdomen shows hepatic steatosis Hold statin for now Follow-up hepatitis panel Trend LFTs Follow abdominal sonogram   Hypokalemia, potassium repleted Hypomagnesemia, mag repleted.   Falls: Patient brought in for falls found to have urinary tract infection generalized weakness failure to thrive multiple complaints.   Continue fall precautions     Hypertension: Presented with hypotension secondary to sepsis Held antihypertensive medications for now IV fluid for hydration We will continue monitor BP and titrate medications accordingly Patient was having sinus tachycardia, Lopressor was given, midodrine and IV albumin was given in the ED as well Discontinued Lopressor. TTE 60 to 65%, grade 2 diastolic dysfunction, no any other significant findings.    CAD: Stable denies any chest pains.  Troponin negative Continued aspirin 81 mg daily, Held statin for now secondary to transaminitis Held Lopressor secondary to hypotension due to sepsis  Anemia multifactorial due to mild iron deficiency, folic acid deficiency, vitamin B12 level 328, target 400. Started oral iron supplement, folate supplement and B12 supplement. Repeat iron profile B12 and folate level after 3 months, follow with PCP.   Infrarenal abdominal aortic aneurysm, incidental finding on CT scan abdomen 4.4 cm infrarenal abdominal aortic aneurysm, patient was recommended to follow with PCP and vascular surgery as an outpatient for surveillance.    Body mass index is 13.56 kg/m.  Interventions:       Diet: Regular diet DVT Prophylaxis: Subcutaneous Heparin    Advance goals of care discussion: Full code  Family Communication: family was not present at bedside, at the time of  interview.  The pt provided permission to discuss medical plan with the family. Opportunity was given to ask question and all questions were answered satisfactorily.   Disposition:  Pt is from home, admitted with fall, found  to have sepsis secondary to UTI, still has low blood pressure, urine culture and blood culture pending, on IV antibiotics and IV fluid, which precludes a safe discharge. Discharge to home pending PT and OT eval, when clinically stable, may require 3 to 5 days.  Subjective: No significant overnight events, patient was resting comfortably seen in the ED, denied any active issues, no chest palpitation no shortness of breath, patient even denied any dysuria.  Patient said that he is being falling and he did not know he hit his head, had some bleeding.  Physical Exam: General:  alert oriented to time, place, and person.  Appear in no distress, affect appropriate Eyes: PERRLA ENT: Oral Mucosa Clear, moist  Neck: no JVD,  Cardiovascular: S1 and S2 Present, no Murmur,  Respiratory: good respiratory effort, Bilateral Air entry equal and Decreased, no Crackles, no wheezes Abdomen: Bowel Sound present, Soft and no tenderness,  Skin: Bruises bilateral arms and dried blood on the frontal head area Extremities: no Pedal edema, no calf tenderness Neurologic: without any new focal findings Gait not checked due to patient safety concerns  Vitals:   01/31/21 1300 01/31/21 1330 01/31/21 1400 01/31/21 1430  BP: (!) 74/46 (!) 83/66 102/88 (!) 89/68  Pulse: 88 92    Resp: 19 17  15   Temp:      TempSrc:      SpO2: 100% 99%    Weight:      Height:        Intake/Output Summary (Last 24 hours) at 01/31/2021 1506 Last data filed at 01/31/2021 1148 Gross per 24 hour  Intake 1204.4 ml  Output 60 ml  Net 1144.4 ml   Filed Weights   01/30/21 1048  Weight: 45.4 kg    Data Reviewed: I have personally reviewed and interpreted daily labs, tele strips, imagings as discussed above. I reviewed all nursing notes, pharmacy notes, vitals, pertinent old records I have discussed plan of care as described above with RN and patient/family.  CBC: Recent Labs  Lab 01/30/21 1054 01/31/21 0429  WBC 10.5 22.0*  HGB  13.4 10.3*  HCT 38.3* 30.1*  MCV 101.6* 103.4*  PLT 328 [id number]   Basic Metabolic Panel: Recent Labs  Lab 01/30/21 1054 01/31/21 0429 01/31/21 1023  NA 137 138  --   K 3.6 2.8*  --   CL 95* 106  --   CO2 32 24  --   GLUCOSE 136* 92  --   BUN 11 11  --   CREATININE 0.53* 0.68  --   CALCIUM 8.9 7.8*  --   MG  --   --  1.6*  PHOS  --   --  3.5    Studies: CT ABDOMEN PELVIS W CONTRAST  Result Date: 01/30/2021 CLINICAL DATA:  Nausea, vomiting EXAM: CT ABDOMEN AND PELVIS WITH CONTRAST TECHNIQUE: Multidetector CT imaging of the abdomen and pelvis was performed using the standard protocol following bolus administration of intravenous contrast. CONTRAST:  43mL OMNIPAQUE IOHEXOL 300 MG/ML  SOLN COMPARISON:  None. FINDINGS: Lower chest: Aortic and coronary artery calcifications. No confluent opacities or effusions. Hepatobiliary: Diffuse heterogeneous low-density throughout the liver compatible with geographic fatty infiltration. Gallbladder unremarkable. Pancreas: No focal abnormality or ductal dilatation. Spleen: No focal abnormality.  Normal size. Adrenals/Urinary Tract:  No adrenal abnormality. No focal renal abnormality. No stones or hydronephrosis. Urinary bladder is unremarkable. Stomach/Bowel: Scattered left colonic diverticulosis. No active diverticulitis. Stomach and small bowel decompressed. No bowel obstruction. Vascular/Lymphatic: 4.4 cm infrarenal abdominal aortic aneurysm with extensive mural plaque. No adenopathy. Reproductive: No visible focal abnormality. Other: No free fluid or free air. Musculoskeletal: No acute bony abnormality IMPRESSION: Severe hepatic steatosis. 4.4 cm infrarenal abdominal aortic aneurysm. Coronary artery disease, aortic atherosclerosis. Electronically Signed   By: Rolm Baptise M.D.   On: 01/30/2021 19:37   DG Chest Portable 1 View  Result Date: 01/30/2021 CLINICAL DATA:  Weakness EXAM: PORTABLE CHEST 1 VIEW COMPARISON:  06/01/2006 FINDINGS: Hyperinflation with  emphysema. Mild bronchitic changes at the bases. No consolidation, pleural effusion, or pneumothorax. Multiple chronic left-sided rib fractures. IMPRESSION: No active disease.  Emphysema and bronchitic changes Electronically Signed   By: Donavan Foil M.D.   On: 01/30/2021 17:30   ECHOCARDIOGRAM COMPLETE  Result Date: 01/31/2021    ECHOCARDIOGRAM REPORT   Patient Name:   AYOBAMI VELIE Date of Exam: 01/31/2021 Medical Rec #:  [id number]     Height:       72.0 in Accession #:    [id number]    Weight:       100.0 lb Date of Birth:  05/29/1950     BSA:          1.587 m Patient Age:    71 years      BP:           78/51 mmHg Patient Gender: M             HR:           82 bpm. Exam Location:  [facility] Procedure: 2D Echo Indications:     Chest Pain R07.9  History:         Patient has no prior history of Echocardiogram examinations.  Sonographer:     Kathlen Brunswick RDCS Referring Phys:  [id number] Val Riles Diagnosing Phys: Ida Rogue MD  Sonographer Comments: Technically difficult study due to poor echo windows. Image acquisition challenging due to patient body habitus. IMPRESSIONS  1. Left ventricular ejection fraction, by estimation, is 60 to 65%. The left ventricle has normal function. The left ventricle has no regional wall motion abnormalities. Left ventricular diastolic parameters are consistent with Grade II diastolic dysfunction (pseudonormalization).  2. Right ventricular systolic function is normal. The right ventricular size is normal.  3. The mitral valve is normal in structure. No evidence of mitral valve regurgitation. No evidence of mitral stenosis.  4. The aortic valve is normal in structure. Aortic valve regurgitation is not visualized. No aortic stenosis is present.  5. The inferior vena cava is normal in size with greater than 50% respiratory variability, suggesting right atrial pressure of 3 mmHg. FINDINGS  Left Ventricle: Left ventricular ejection fraction, by estimation, is 60 to 65%. The left  ventricle has normal function. The left ventricle has no regional wall motion abnormalities. The left ventricular internal cavity size was normal in size. There is  no left ventricular hypertrophy. Left ventricular diastolic parameters are consistent with Grade II diastolic dysfunction (pseudonormalization). Right Ventricle: The right ventricular size is normal. No increase in right ventricular wall thickness. Right ventricular systolic function is normal. Left Atrium: Left atrial size was normal in size. Right Atrium: Right atrial size was normal in size. Pericardium: There is no evidence of pericardial effusion. Mitral Valve: The mitral valve is normal in structure. There is  mild calcification of the mitral valve leaflet(s). No evidence of mitral valve regurgitation. No evidence of mitral valve stenosis. Tricuspid Valve: The tricuspid valve is normal in structure. Tricuspid valve regurgitation is mild . No evidence of tricuspid stenosis. Aortic Valve: The aortic valve is normal in structure. Aortic valve regurgitation is not visualized. No aortic stenosis is present. Aortic valve peak gradient measures 7.8 mmHg. Pulmonic Valve: The pulmonic valve was normal in structure. Pulmonic valve regurgitation is not visualized. No evidence of pulmonic stenosis. Aorta: The aortic root is normal in size and structure. Venous: The inferior vena cava is normal in size with greater than 50% respiratory variability, suggesting right atrial pressure of 3 mmHg. IAS/Shunts: No atrial level shunt detected by color flow Doppler.  LEFT VENTRICLE PLAX 2D LVIDd:         4.60 cm Diastology LVIDs:         3.20 cm LV e' medial:    8.16 cm/s LV PW:         0.80 cm LV E/e' medial:  7.8 LV IVS:        0.90 cm LV e' lateral:   7.83 cm/s                        LV E/e' lateral: 8.1  RIGHT VENTRICLE RV Basal diam:  3.60 cm RV S prime:     11.20 cm/s TAPSE (M-mode): 1.8 cm LEFT ATRIUM           Index        RIGHT ATRIUM          Index LA diam:       3.20 cm 2.02 cm/m   RA Area:     9.87 cm LA Vol (A4C): 17.7 ml 11.15 ml/m  RA Volume:   21.00 ml 13.23 ml/m  AORTIC VALVE AV Vmax:      140.00 cm/s AV Peak Grad: 7.8 mmHg LVOT Vmax:    51.60 cm/s LVOT Vmean:   33.500 cm/s LVOT VTI:     0.105 m MITRAL VALVE               TRICUSPID VALVE MV Area (PHT): 4.39 cm    TV Peak grad:   19.5 mmHg MV Decel Time: 173 msec    TV Vmax:        2.21 m/s MV E velocity: 63.40 cm/s MV A velocity: 44.10 cm/s  SHUNTS MV E/A ratio:  1.44        Systemic VTI: 0.10 m Julien Nordmann MD Electronically signed by Julien Nordmann MD Signature Date/Time: 01/31/2021/1:43:51 PM    Final    US Abdomen Limited RUQ (LIVER/GB)  Result Date: 01/31/2021 CLINICAL DATA:  Transaminitis. EXAM: ULTRASOUND ABDOMEN LIMITED RIGHT UPPER QUADRANT COMPARISON:  None. FINDINGS: Gallbladder: Minimally distended. No wall thickening. No shadowing stone. Material in the upper gallbladder is consistent with sludge. No pericholecystic fluid. Common bile duct: Diameter: 3 mm Liver: Normal size. Significant increased parenchymal echogenicity with decreased through transmission of the sound beam. No mass. Portal vein is patent on color Doppler imaging with normal direction of blood flow towards the liver. Other: None. IMPRESSION: 1. No acute findings. 2. Gallbladder sludge but no shadowing stones and no evidence of acute cholecystitis. 3. Significant increased liver parenchymal echogenicity consistent with hepatic steatosis. Electronically Signed   By: Amie Portland M.D.   On: 01/31/2021 10:48    Scheduled Meds:  aspirin EC  81 mg Oral Daily  feeding supplement  237 mL Oral TID BM   heparin  5,000 Units Subcutaneous Q8H   pantoprazole (PROTONIX) IV  40 mg Intravenous Q12H   potassium chloride  40 mEq Oral Once   sodium chloride flush  3 mL Intravenous Q12H   thiamine injection  100 mg Intravenous Daily   Continuous Infusions:  sodium chloride 100 mL/hr at 01/31/21 1151   cefTRIAXone (ROCEPHIN)  IV Stopped  (01/31/21 1409)   potassium chloride 10 mEq (01/31/21 1427)   PRN Meds: acetaminophen **OR** acetaminophen, ondansetron (ZOFRAN) IV  Time spent: 35 minutes  Author: Val Riles. MD [facility] 01/31/2021 3:06 PM  To reach On-call, see care teams to locate the attending and reach out to them via [email or web address]. If 7PM-7AM, please contact night-coverage If you still have difficulty reaching the attending provider, please page the [facility] (Director on Call) for [facility] on amion for assistance.

## 2021-01-31 NOTE — ED Notes (Signed)
Called and updated pts brother in law. Brother in law reports pt has dementia-like confusion at baseline (not sure of medical diagnosis).

## 2021-01-31 NOTE — ED Notes (Signed)
Called lab at this time to obtain blood work.

## 2021-01-31 NOTE — ED Notes (Signed)
Pt sitting at the side of the bed, brother at bedside. Pt is wanting to change his pants. Pt changed into pants and new socks with brother at bedside no assistance needed.

## 2021-01-31 NOTE — Progress Notes (Addendum)
PT Cancellation Note  Patient Details Name: [name] MRN: [id number] DOB: [birth year]   Cancelled Treatment:    Reason Eval/Treat Not Completed: Patient not medically ready Due to patient's low blood pressure (72/75mmHg), will hold on PT at this time, and will re-attempt at a later time/date as available and patient medically appropriate for PT. Thank you!  Addendum: PT order starts 1/8 at 2102 (9:02pm) will hold PT until tomorrow.    Angelica Ran, PT  01/31/21. 7:57 AM

## 2021-01-31 NOTE — ED Notes (Signed)
This RN in room to administer medications. Pt at end of bed and had removed all monitoring equipment and both IV catheters.

## 2021-01-31 NOTE — ED Notes (Signed)
Pt got out of bed to use bathroom. Pt has previously been using urinal in bed. Pt seems confused, however, answers all orientation questions appropriately.

## 2021-02-01 LAB — BASIC METABOLIC PANEL
Anion gap: 7 (ref 5–15)
BUN: 8 mg/dL (ref 8–23)
CO2: 21 mmol/L — ABNORMAL LOW (ref 22–32)
Calcium: 7.6 mg/dL — ABNORMAL LOW (ref 8.9–10.3)
Chloride: 108 mmol/L (ref 98–111)
Creatinine, Ser: 0.62 mg/dL (ref 0.61–1.24)
GFR, Estimated: >60 mL/min (ref >60)
Glucose, Bld: 62 mg/dL — ABNORMAL LOW (ref 70–99)
Potassium: 3.7 mmol/L (ref 3.5–5.1)
Sodium: 136 mmol/L (ref 135–145)

## 2021-02-01 LAB — HEPATIC FUNCTION PANEL
ALT: 50 U/L — ABNORMAL HIGH (ref 0–44)
AST: 70 U/L — ABNORMAL HIGH (ref 15–41)
Albumin: 2.8 g/dL — ABNORMAL LOW (ref 3.5–5.0)
Alkaline Phosphatase: 137 U/L — ABNORMAL HIGH (ref 38–126)
Bilirubin, Direct: 0.4 mg/dL — ABNORMAL HIGH (ref 0.0–0.2)
Indirect Bilirubin: 0.5 mg/dL (ref 0.3–0.9)
Total Bilirubin: 0.9 mg/dL (ref 0.3–1.2)
Total Protein: 5.5 g/dL — ABNORMAL LOW (ref 6.5–8.1)

## 2021-02-01 LAB — CBC
HCT: 27.5 % — ABNORMAL LOW (ref 39.0–52.0)
Hemoglobin: 9.3 g/dL — ABNORMAL LOW (ref 13.0–17.0)
MCH: 35.5 pg — ABNORMAL HIGH (ref 26.0–34.0)
MCHC: 33.8 g/dL (ref 30.0–36.0)
MCV: 105 fL — ABNORMAL HIGH (ref 80.0–100.0)
Platelets: 198 10*3/uL (ref 150–400)
RBC: 2.62 MIL/uL — ABNORMAL LOW (ref 4.22–5.81)
RDW: 13.3 % (ref 11.5–15.5)
WBC: 17.3 10*3/uL — ABNORMAL HIGH (ref 4.0–10.5)
nRBC: 0 % (ref 0.0–0.2)

## 2021-02-01 LAB — MAGNESIUM: Magnesium: 1.9 mg/dL (ref 1.7–2.4)

## 2021-02-01 LAB — PHOSPHORUS: Phosphorus: 1.9 mg/dL — ABNORMAL LOW (ref 2.5–4.6)

## 2021-02-01 MED ORDER — LACTATED RINGERS IV BOLUS
500.0000 mL | Freq: Once | INTRAVENOUS | Status: AC
  Administered 2021-02-01: 500 mL via INTRAVENOUS

## 2021-02-01 MED ORDER — CHLORHEXIDINE GLUCONATE CLOTH 2 % EX PADS
6.0000 | MEDICATED_PAD | Freq: Every day | CUTANEOUS | Status: DC
  Administered 2021-02-02 – 2021-02-03 (×2): 6 via TOPICAL

## 2021-02-01 MED ORDER — POTASSIUM PHOSPHATES 15 MMOLE/5ML IV SOLN
30.0000 mmol | Freq: Once | INTRAVENOUS | Status: AC
  Administered 2021-02-01: 30 mmol via INTRAVENOUS
  Filled 2021-02-01: qty 10

## 2021-02-01 MED ORDER — METOPROLOL TARTRATE 25 MG PO TABS
12.5000 mg | ORAL_TABLET | Freq: Two times a day (BID) | ORAL | Status: DC
  Administered 2021-02-02 – 2021-02-03 (×3): 12.5 mg via ORAL
  Filled 2021-02-01 (×3): qty 1

## 2021-02-01 NOTE — ED Notes (Signed)
Looked in patient's room across from nurse station and saw pt standing at end of bed. Pt ripped off all leads, BP cuff, pulse ox and had already pulled out IV catheter from left forearm. Pt also pulled off all clothing and was pulling on foley urinary catheter. Was able to assist patient back into bed with assistance from [place], [place]. Replaced dirty gown, placed brief on patient and verified foley urinary catheter still patent.

## 2021-02-01 NOTE — ED Notes (Signed)
Patient's pants and boxers removed due to soilage from urine and some old stool. Patient cleaned with bath wipes and soap wipes around peri area, rectum and down legs. Patient's gown also changed. Bedding clean. Placed new chux pad under patient, placed 14 fr Latex foley indwelling catheter per order. Unable to do bladder scan prior due to bladder scanner not working. Brief placed on patient over foley catheter and catheter secured to leg with statlock. Patient given new warm blankets.

## 2021-02-01 NOTE — Evaluation (Signed)
Occupational Therapy Evaluation Patient Details Name: [name] MRN: [id number] DOB: Jul 09, 1950 Today's Date: 02/01/2021   History of Present Illness 71 y.o. male with past medical history of HTN, HLD, CAD, CVA, seen in ed with complaints of falls and decreased po intake; arrived with laceration on the head. Found to have a UTI.   Clinical Impression   Pt was seen for OT evaluation this date. Prior to hospital admission, pt was independent, living by himself, but endorses multiple falls which he attributes to stumbling. Pt reports he has 4 brothers and 1 sister who stop in PRN and who could assist him once he discharges. No family present to verify this. Currently pt demonstrates impairments as described below (See OT problem list) which functionally limit his ability to perform ADL/self-care tasks. Pt currently requires supv for seated LB ADL and CGA for ADL transfers.  Pt would benefit from skilled OT services to address noted impairments and functional limitations (see below for any additional details) in order to maximize safety and independence while minimizing falls risk and caregiver burden. Upon hospital discharge, recommend STR to maximize pt safety and return to PLOF unless pt able to verify family able to provide some assist at home temporarily to maximize pt safety.     Recommendations for follow up therapy are one component of a multi-disciplinary discharge planning process, led by the attending physician.  Recommendations may be updated based on patient status, additional functional criteria and insurance authorization.   Follow Up Recommendations  Skilled nursing-short term rehab (<3 hours/day)    Assistance Recommended at Discharge    Patient can return home with the following A little help with walking and/or transfers;A little help with bathing/dressing/bathroom;Assistance with cooking/housework;Direct supervision/assist for medications management;Assist for  transportation;Help with stairs or ramp for entrance    Functional Status Assessment  Patient has had a recent decline in their functional status and demonstrates the ability to make significant improvements in function in a reasonable and predictable amount of time.  Equipment Recommendations  None recommended by OT    Recommendations for Other Services       Precautions / Restrictions Precautions Precautions: Fall Restrictions Weight Bearing Restrictions: No      Mobility Bed Mobility Overal bed mobility: Needs Assistance Bed Mobility: Supine to Sit;Sit to Supine     Supine to sit: Supervision;HOB elevated Sit to supine: Supervision;HOB elevated   General bed mobility comments: PT managing catheter    Transfers Overall transfer level: Needs assistance Equipment used: Rolling walker (2 wheels) Transfers: Sit to/from Stand Sit to Stand: Min guard;From elevated surface           General transfer comment: CGA and HHA to steady from elevated ER bed.      Balance Overall balance assessment: Needs assistance Sitting-balance support: Feet supported;No upper extremity supported Sitting balance-Leahy Scale: Fair Sitting balance - Comments: able to sit EOB without UE support to manage items on his tray table,   Standing balance support: Bilateral upper extremity supported Standing balance-Leahy Scale: Fair Standing balance comment: Required IV pole and CGA to steady during standing and ambulation.                           ADL either performed or assessed with clinical judgement   ADL  General ADL Comments: Pt able to complete LB dressing from seated position to doff/don socks while maintaining fair dynamic sitting balance, CGA for ADL transfers (balance better with RW)     Vision         Perception     Praxis      Pertinent Vitals/Pain Pain Assessment: No/denies pain     Hand  Dominance Left   Extremity/Trunk Assessment Upper Extremity Assessment Upper Extremity Assessment: Generalized weakness   Lower Extremity Assessment Lower Extremity Assessment: Generalized weakness       Communication Communication Communication: No difficulties   Cognition Arousal/Alertness: Awake/alert Behavior During Therapy: WFL for tasks assessed/performed Overall Cognitive Status: No family/caregiver present to determine baseline cognitive functioning                                 General Comments: Pt alert and oriented to self, month, and grossly to situation/place. Demos difficulty wiht PLOF, follows commands well     General Comments       Exercises     Shoulder Instructions      Home Living Family/patient expects to be discharged to:: Private residence Living Arrangements: Alone Available Help at Discharge: Family;Available PRN/intermittently (pt reports having 4 brothers a 1 sister who can assist PRN) Type of Home: House Home Access: Level entry     Home Layout: One level     Bathroom Shower/Tub: Occupational psychologist: Standard     Home Equipment: Conservation officer, nature (2 wheels);Wheelchair - manual;Transport chair;Grab bars - tub/shower   Additional Comments: Lives alone in a large 8-9 bedroom ranch style home. Does not typically use an AD.      Prior Functioning/Environment Prior Level of Function : Independent/Modified Independent;History of Falls (last six months);Driving;Working/employed             Mobility Comments: Reports he is independent at baseline. 6 falls in the last 6 months - mostly at night. ADLs Comments: Reports independent at baseline. No family present to verify. 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        OT Problem List: Decreased activity tolerance;Impaired balance (sitting and/or standing);Decreased knowledge of use of DME or AE;Decreased strength      OT  Treatment/Interventions: Self-care/ADL training;Therapeutic exercise;Therapeutic activities;Energy conservation;DME and/or AE instruction;Patient/family education;Balance training    OT Goals(Current goals can be found in the care plan section) Acute Rehab OT Goals Patient Stated Goal: go home OT Goal Formulation: With patient Time For Goal Achievement: 02/15/21 Potential to Achieve Goals: Good ADL Goals Pt Will Perform Lower Body Dressing: sit to/from stand;with modified independence Pt Will Transfer to Toilet: with modified independence;ambulating;regular height toilet (LRAD PRN) Additional ADL Goal #1: Pt will verbalize plan to implement at least 2 learned falls prevention strategies into daily ADL routines to minimize falls risk.  OT Frequency: Min 2X/week    Co-evaluation              AM-PAC OT "6 Clicks" Daily Activity     Outcome Measure Help from another person eating meals?: None Help from another person taking care of personal grooming?: None Help from another person toileting, which includes using toliet, bedpan, or urinal?: A Little Help from another person bathing (including washing, rinsing, drying)?: A Little Help from another person to put on and taking off regular upper body clothing?: None Help from another person to put on and taking off regular  lower body clothing?: A Little 6 Click Score: 21   End of Session    Activity Tolerance: Patient tolerated treatment well Patient left: in bed;with call bell/phone within reach  OT Visit Diagnosis: Other abnormalities of gait and mobility (R26.89);History of falling (Z91.81)                Time: [id number] OT Time Calculation (min): 15 min Charges:  OT General Charges $OT Visit: 1 Visit OT Evaluation $OT Eval Moderate Complexity: 1 Mod  Ardeth Perfect., MPH, MS, OTR/L ascom [phone] 02/01/21, 2:21 PM

## 2021-02-01 NOTE — Evaluation (Signed)
Physical Therapy Evaluation Patient Details Name: [name] MRN: [id number] DOB: May 14, 1950 Today's Date: 02/01/2021  History of Present Illness  71 y.o. male with past medical history of HTN, HLD, CAD, CVA, seen in ed with complaints of falls and decreased po intake; arrived with laceration on the head. Found to have a UTI.   Clinical Impression  Pt received supine in bed, brother Iantha Fallen) in room, agreeable to therapy. He lives alone in a large, ranch style home (9 bedrooms) with no STE. Does not typically use AD; he does own RW and w/c if needed. Reports he falls frequently, typically at night - PT educated on night lights. Oriented to self only; difficulty answering some questions however follows commands well. Lacks safety awareness.   Pt performed bed mobility with SUP, STS and ambulation with CGA and UE support of HHA or IV pole. He required increased steadying on one occasion during ambulation. Pt required multimodal cueing during turns and when stepping back to sit down. He presents with global weakness, endurance deficits and decreased balance. Due to living arrangements - pt lives alone and has no caregiver support - PT is recommending SNF to assist pt with returning to his baseline level of functioning prior to returning home.      Recommendations for follow up therapy are one component of a multi-disciplinary discharge planning process, led by the attending physician.  Recommendations may be updated based on patient status, additional functional criteria and insurance authorization.  Follow Up Recommendations Skilled nursing-short term rehab (<3 hours/day)    Assistance Recommended at Discharge Frequent or constant Supervision/Assistance  Patient can return home with the following  A little help with walking and/or transfers;A little help with bathing/dressing/bathroom;Assistance with cooking/housework;Direct supervision/assist for financial management;Assist for  transportation;Direct supervision/assist for medications management    Equipment Recommendations Other (comment) (TBD at next venue of care)  Recommendations for Other Services       Functional Status Assessment Patient has had a recent decline in their functional status and demonstrates the ability to make significant improvements in function in a reasonable and predictable amount of time.     Precautions / Restrictions Precautions Precautions: Fall Restrictions Weight Bearing Restrictions: No      Mobility  Bed Mobility Overal bed mobility: Needs Assistance Bed Mobility: Supine to Sit;Sit to Supine     Supine to sit: Supervision;HOB elevated Sit to supine: Supervision;HOB elevated   General bed mobility comments: PT managing catheter    Transfers Overall transfer level: Needs assistance Equipment used: 1 person hand held assist Transfers: Sit to/from Stand Sit to Stand: Min guard;From elevated surface           General transfer comment: CGA and HHA to steady from elevated ER bed.    Ambulation/Gait Ambulation/Gait assistance: Min guard Gait Distance (Feet): 50 Feet Assistive device: IV Pole Gait Pattern/deviations: Step-through pattern;Narrow base of support;Decreased stride length Gait velocity: decerased     General Gait Details: 32ft holding onto IV pole to steady. CGA to steady as well - multiple minor occurances and 1 stumble to the right. Bilateral toe out, especially right. Decreased stride length; increased time to turn.  Stairs            Wheelchair Mobility    Modified Rankin (Stroke Patients Only)       Balance Overall balance assessment: Needs assistance Sitting-balance support: Feet supported;Bilateral upper extremity supported Sitting balance-Leahy Scale: Fair Sitting balance - Comments: no LOB during MMT or management of lines  Standing balance support: Single extremity supported;During functional activity Standing  balance-Leahy Scale: Fair Standing balance comment: Required IV pole and CGA to steady during standing and ambulation.                             Pertinent Vitals/Pain Pain Assessment: No/denies pain    Home Living Family/patient expects to be discharged to:: Private residence Living Arrangements: Alone   Type of Home: House Home Access: Level entry       Home Layout: One level Home Equipment: Agricultural consultantolling Walker (2 wheels);Wheelchair - manual;Transport chair;Grab bars - tub/shower Additional Comments: Lives alone in a large 8-9 bedroom ranch style home. Does not typically use an AD.    Prior Function Prior Level of Function : Independent/Modified Independent;History of Falls (last six months)             Mobility Comments: Reports he is independent at baseline. 6 falls in the last 6 months - mostly at night. ADLs Comments: Reports independent at baseline. Unsure of accuracy.     Hand Dominance   Dominant Hand: Left    Extremity/Trunk Assessment   Upper Extremity Assessment Upper Extremity Assessment: Generalized weakness (R weaker than left (pt is left-handed))    Lower Extremity Assessment Lower Extremity Assessment: Generalized weakness (bilateral hip flexors 3+/5)       Communication   Communication: No difficulties  Cognition Arousal/Alertness: Awake/alert Behavior During Therapy: WFL for tasks assessed/performed Overall Cognitive Status: History of cognitive impairments - at baseline                                 General Comments: Oriented to self-only. States [place] is in [facility] however does not know what town/county. Unaware of date or situation. Occasionally unable to answer questions appropriately (talks around the answer without giving one). In general pleasant to interact with. Decreased safety awareness.        General Comments      Exercises     Assessment/Plan    PT Assessment Patient needs continued PT services   PT Problem List Decreased strength;Decreased mobility;Decreased safety awareness;Decreased activity tolerance;Decreased balance       PT Treatment Interventions Therapeutic exercise;Gait training;Balance training;Functional mobility training;Therapeutic activities;Patient/family education;DME instruction    PT Goals (Current goals can be found in the Care Plan section)  Acute Rehab PT Goals Patient Stated Goal: to get stronger PT Goal Formulation: With patient Time For Goal Achievement: 02/15/21 Potential to Achieve Goals: Fair    Frequency Min 2X/week     Co-evaluation               AM-PAC PT "6 Clicks" Mobility  Outcome Measure Help needed turning from your back to your side while in a flat bed without using bedrails?: None Help needed moving from lying on your back to sitting on the side of a flat bed without using bedrails?: A Little Help needed moving to and from a bed to a chair (including a wheelchair)?: A Little Help needed standing up from a chair using your arms (e.g., wheelchair or bedside chair)?: A Little Help needed to walk in hospital room?: A Little Help needed climbing 3-5 steps with a railing? : A Lot 6 Click Score: 18    End of Session Equipment Utilized During Treatment: Gait belt Activity Tolerance: Patient tolerated treatment well;Patient limited by fatigue Patient left: in bed;with family/visitor present;Other (  comment);with call bell/phone within reach (curtains open for RN to monitor) Nurse Communication: Mobility status PT Visit Diagnosis: Unsteadiness on feet (R26.81);Repeated falls (R29.6);Muscle weakness (generalized) (M62.81);Other abnormalities of gait and mobility (R26.89);Difficulty in walking, not elsewhere classified (R26.2);History of falling (Z91.81);Adult, failure to thrive (R62.7)    Time: 1120-1149 PT Time Calculation (min) (ACUTE ONLY): 29 min   Charges:   PT Evaluation $PT Eval Moderate Complexity: 1 Mod PT  Treatments $Therapeutic Activity: 8-22 mins        Basilia Jumbo PT, DPT 02/01/21 1:23 PM [phone]

## 2021-02-01 NOTE — Progress Notes (Signed)
[facility] Progress Note  Patient: [name]    [id number]  DOA: 01/30/2021     Date of Service: the patient was seen and examined on 02/01/2021  Chief Complaint  Patient presents with   Failure To Thrive   Fall   Brief hospital course:[name] is a 71 y.o. male with past medical history of HTN, HLD, CAD, CVA, seen in ed with complaints of falls and decreased po intake lives alone.  Patient brought in for falls decreased p.o. intake, found to have a urinary tract infection patient is alert and awake and oriented and gives brief history.  Patient denies any complaints of headache blurred vision chest pain shortness of breath palpitation abdominal pain fevers chills. Pt is alert awake but somewhat disoriented, denies any complaints. Pt does not reports any chest pain sob,stomach issue, fever or chills.   Patient in the emergency room meet sepsis criteria with tachycardia respiratory rate and urinary tract infection for which the patient was given Rocephin and IV fluid rehydration.  Blood work today showed glucose of 136 normal kidney function, abnormal LFTs with AST of 105 and ALT of 83 patient did have intermittent LFT elevations since 2020 but very mild.  Patient's lactic acid is 2.5, normal white count of 10.5 hemoglobin 13.4 MCV of 101.6, Respiratory panel negative for flu and COVID.  Urine shows cloudy large hemoglobin large leukocytes many bacteria more than 50 WBCs along with hematuria    Assessment and Plan: Principal Problem:   Falls Active Problems:   UTI (urinary tract infection)   Severe sepsis (HCC)   Abnormal LFTs   Essential hypertension   Coronary artery disease   Sepsis secondary to UTI Patient meeting severe sepsis criteria with hypotension lactic acidosis is which is corrected with IV fluid rehydration.  Elevated WBC count and procalcitonin continue Rocephin  blood cultures NGTD  Urine culture growing GNR, follow ID and sensitive  report   Transaminitis most likely secondary to sepsis and hypotension CT abdomen shows hepatic steatosis Hold statin for now Follow-up hepatitis panel Trend LFTs US abdominal GB sludge, no acute findings, hepatic acidosis.   Hypokalemia, potassium repleted Hypomagnesemia, mag repleted. Hypophosphatemia, phosphorus repleted.   Falls: Patient brought in for falls found to have urinary tract infection generalized weakness failure to thrive multiple complaints.   Continue fall precautions     Hypertension: Presented with hypotension secondary to sepsis Held antihypertensive medications for now IV fluid for hydration We will continue monitor BP and titrate medications accordingly Patient was having sinus tachycardia, Lopressor was given, midodrine and IV albumin was given in the ED as well, Discontinued Lopressor. TTE 60 to [id number], grade 2 diastolic dysfunction, no any other significant findings. On 1/9 BP elevated today, started Lopressor 12.5 mg p.o. twice daily   CAD: Stable denies any chest pains.  Troponin negative Continued aspirin 81 mg daily, Held statin for now secondary to transaminitis Held Lopressor secondary to hypotension due to sepsis  Anemia multifactorial due to mild iron deficiency, folic acid deficiency, vitamin B12 level 328, target >400. Started oral iron supplement, folate supplement and B12 supplement. Repeat iron profile B12 and folate level after 3 months, follow with PCP.   Infrarenal abdominal aortic aneurysm, incidental finding on CT scan abdomen 4.4 cm infrarenal abdominal aortic aneurysm, patient was recommended to follow with PCP and vascular surgery as an outpatient for surveillance.    Body mass index is 13.56 kg/m.  Interventions:   PT and OT eval done, recommend SNF  placement    Diet: Regular diet DVT Prophylaxis: Subcutaneous Heparin    Advance goals of care discussion: Full code  Family Communication: family was not present at  bedside, at the time of interview.  The pt provided permission to discuss medical plan with the family. Opportunity was given to ask question and all questions were answered satisfactorily.   Disposition:  Pt is from home, admitted with fall, found to have sepsis secondary to UTI, still has low blood pressure, urine culture and blood culture pending, on IV antibiotics and IV fluid, which precludes a safe discharge. Discharge to SNF, when clinically stable, may require 2 to 3 days more   Subjective: No significant overnight events, patient was resting comfortably seen in the ED, denied any active issues, no chest palpitation no shortness of breath, patient even denied any dysuria.  Patient did walk with physical therapy Patient brother was at bedside, he is concerned for dementia and placement as patient lives alone   Physical Exam: General:  alert oriented to time, place, and person.  Appear in no distress, affect appropriate Eyes: PERRLA ENT: Oral Mucosa Clear, moist  Neck: no JVD,  Cardiovascular: S1 and S2 Present, no Murmur,  Respiratory: good respiratory effort, Bilateral Air entry equal and Decreased, no Crackles, no wheezes Abdomen: Bowel Sound present, Soft and no tenderness,  Skin: Bruises bilateral arms and dried blood on the frontal head area Extremities: no Pedal edema, no calf tenderness Neurologic: without any new focal findings Gait not checked due to patient safety concerns  Vitals:   02/01/21 0600 02/01/21 0740 02/01/21 1100 02/01/21 1221  BP: (!) 84/69 (!) 146/96 118/90   Pulse: 89 95 (!) 103   Resp: 16 17    Temp:      TempSrc:      SpO2: 95% 100% 96%   Weight:    54 kg  Height:        Intake/Output Summary (Last 24 hours) at 02/01/2021 1528 Last data filed at 02/01/2021 0701 Gross per 24 hour  Intake --  Output 300 ml  Net -300 ml   Filed Weights   01/30/21 1048 02/01/21 1221  Weight: 45.4 kg 54 kg    Data Reviewed: I have personally reviewed and  interpreted daily labs, tele strips, imagings as discussed above. I reviewed all nursing notes, pharmacy notes, vitals, pertinent old records I have discussed plan of care as described above with RN and patient/family.  CBC: Recent Labs  Lab 01/30/21 1054 01/31/21 0429 02/01/21 0728  WBC 10.5 22.0* 17.3*  HGB 13.4 10.3* 9.3*  HCT 38.3* 30.1* 27.5*  MCV 101.6* 103.4* 105.0*  PLT 328 211 [id number]   Basic Metabolic Panel: Recent Labs  Lab 01/30/21 1054 01/31/21 0429 01/31/21 1023 01/31/21 2124 02/01/21 0728  NA 137 138  --  135 136  K 3.6 2.8*  --  3.6 3.7  CL 95* 106  --  105 108  CO2 32 24  --  23 21*  GLUCOSE 136* 92  --  77 62*  BUN 11 11  --  10 8  CREATININE 0.53* 0.68  --  0.73 0.62  CALCIUM 8.9 7.8*  --  7.8* 7.6*  MG  --   --  1.6*  --  1.9  PHOS  --   --  3.5  --  1.9*    Studies: No results found.  Scheduled Meds:  vitamin C  500 mg Oral Daily   aspirin EC  81 mg Oral Daily  feeding supplement  237 mL Oral TID BM   folic acid  1 mg Oral Daily   heparin  5,000 Units Subcutaneous Q8H   iron polysaccharides  150 mg Oral Daily   metoprolol tartrate  12.5 mg Oral BID   pantoprazole  40 mg Oral Daily   sodium chloride flush  3 mL Intravenous Q12H   thiamine  100 mg Oral Daily   vitamin B-12  500 mcg Oral Daily   Vitamin D (Ergocalciferol)  50,000 Units Oral Q7 days   Continuous Infusions:  sodium chloride 100 mL/hr at 02/01/21 0730   cefTRIAXone (ROCEPHIN)  IV Stopped (01/31/21 1409)   potassium PHOSPHATE IVPB (in mmol) 30 mmol (02/01/21 1100)   PRN Meds: acetaminophen **OR** acetaminophen, ondansetron (ZOFRAN) IV  Time spent: 35 minutes  Author: Val Riles. MD [facility] 02/01/2021 3:28 PM  To reach On-call, see care teams to locate the attending and reach out to them via [email or web address]. If 7PM-7AM, please contact night-coverage If you still have difficulty reaching the attending provider, please page the [facility] (Director on Call) for [facility] on amion for assistance.

## 2021-02-01 NOTE — ED Notes (Signed)
Pt sitting on end of bed stating that he needs to urinate. Pt reminded that he has a catheter in his bladder and he does not need to get up. Was able to redirect pt. Pt calm, confused. Pts stretcher pulled out to middle of room in order to be able to see pt and pt see [place].

## 2021-02-02 DIAGNOSIS — E43 Unspecified severe protein-calorie malnutrition: Secondary | ICD-10-CM | POA: Insufficient documentation

## 2021-02-02 DIAGNOSIS — I10 Essential (primary) hypertension: Secondary | ICD-10-CM

## 2021-02-02 LAB — HEPATIC FUNCTION PANEL
ALT: 42 U/L (ref 0–44)
AST: 53 U/L — ABNORMAL HIGH (ref 15–41)
Albumin: 2.6 g/dL — ABNORMAL LOW (ref 3.5–5.0)
Alkaline Phosphatase: 133 U/L — ABNORMAL HIGH (ref 38–126)
Bilirubin, Direct: 0.3 mg/dL — ABNORMAL HIGH (ref 0.0–0.2)
Indirect Bilirubin: 0.5 mg/dL (ref 0.3–0.9)
Total Bilirubin: 0.8 mg/dL (ref 0.3–1.2)
Total Protein: 5.3 g/dL — ABNORMAL LOW (ref 6.5–8.1)

## 2021-02-02 LAB — CBC
HCT: 27 % — ABNORMAL LOW (ref 39.0–52.0)
Hemoglobin: 9.2 g/dL — ABNORMAL LOW (ref 13.0–17.0)
MCH: 34.7 pg — ABNORMAL HIGH (ref 26.0–34.0)
MCHC: 34.1 g/dL (ref 30.0–36.0)
MCV: 101.9 fL — ABNORMAL HIGH (ref 80.0–100.0)
Platelets: 206 10*3/uL (ref 150–400)
RBC: 2.65 MIL/uL — ABNORMAL LOW (ref 4.22–5.81)
RDW: 13.3 % (ref 11.5–15.5)
WBC: 11.8 10*3/uL — ABNORMAL HIGH (ref 4.0–10.5)
nRBC: 0 % (ref 0.0–0.2)

## 2021-02-02 LAB — URINE CULTURE: Culture: >=100000 — AB

## 2021-02-02 LAB — BASIC METABOLIC PANEL
Anion gap: 6 (ref 5–15)
BUN: 6 mg/dL — ABNORMAL LOW (ref 8–23)
CO2: 23 mmol/L (ref 22–32)
Calcium: 8 mg/dL — ABNORMAL LOW (ref 8.9–10.3)
Chloride: 109 mmol/L (ref 98–111)
Creatinine, Ser: 0.49 mg/dL — ABNORMAL LOW (ref 0.61–1.24)
GFR, Estimated: >60 mL/min (ref >60)
Glucose, Bld: 98 mg/dL (ref 70–99)
Potassium: 3.4 mmol/L — ABNORMAL LOW (ref 3.5–5.1)
Sodium: 138 mmol/L (ref 135–145)

## 2021-02-02 LAB — PHOSPHORUS: Phosphorus: 2.5 mg/dL (ref 2.5–4.6)

## 2021-02-02 LAB — MAGNESIUM: Magnesium: 1.9 mg/dL (ref 1.7–2.4)

## 2021-02-02 MED ORDER — POTASSIUM CHLORIDE CRYS ER 20 MEQ PO TBCR
40.0000 meq | EXTENDED_RELEASE_TABLET | Freq: Once | ORAL | Status: AC
  Administered 2021-02-02: 40 meq via ORAL
  Filled 2021-02-02: qty 2

## 2021-02-02 MED ORDER — ADULT MULTIVITAMIN W/MINERALS CH
1.0000 | ORAL_TABLET | Freq: Every day | ORAL | Status: DC
  Administered 2021-02-03: 1 via ORAL
  Filled 2021-02-02: qty 1

## 2021-02-02 NOTE — Progress Notes (Signed)
Mobility Specialist - Progress Note   02/02/21 1000  Mobility  Activity Ambulated in hall;Transferred:  Bed to chair  Level of Assistance Standby assist, set-up cues, supervision of patient - no hands on  Assistive Device Front wheel walker  Distance Ambulated (ft) 200 ft  Mobility Ambulated with assistance in hallway;Out of bed to chair with meals  Mobility Response Tolerated well  Mobility performed by Mobility specialist  $Mobility charge 1 Mobility     Pt lying in bed upon arrival, utilizing RA. Pt reports fatigue this date. Min encouragement for completion of tasks. Pt ambulated in hallway with supervision and RW, no LOB. Pt returned to recliner, with alarm set and needs in reach.    Filiberto Pinks Mobility Specialist 02/02/21, 10:52 AM

## 2021-02-02 NOTE — Progress Notes (Signed)
Initial Nutrition Assessment  DOCUMENTATION CODES:   Severe malnutrition in context of social or environmental circumstances  INTERVENTION:   Ensure Enlive po TID, each supplement provides 350 kcal and 20 grams of protein  Magic cup TID with meals, each supplement provides 290 kcal and 9 grams of protein  MVI po daily   Pt at high refeed risk; recommend monitor potassium, magnesium and phosphorus labs daily until stable  NUTRITION DIAGNOSIS:   Severe Malnutrition related to social / environmental circumstances as evidenced by severe fat depletion, severe muscle depletion, 12 percent weight loss in 6 months.  GOAL:   Patient will meet greater than or equal to 90% of their needs  MONITOR:   PO intake, Supplement acceptance, Labs, Weight trends, Skin, I & O's  REASON FOR ASSESSMENT:   Malnutrition Screening Tool    ASSESSMENT:   71 y.o. male with past medical history of HTN, HLD, CAD and CVA who is admitted with UTI and sepsis.  Met with pt in room today. Pt reports decreased appetite and oral intake for the past month but reports that his appetite is improving in hospital. Pt reports eating 100% of an omelet for breakfast this morning. Pt had a cheeseburger on his side table that he reports he is going to eat later. Pt did drink two Ensure supplements today and reports that he likes the chocolate best. Per chart, pt is down 16lbs(12%) since June; RD unsure how recently weight loss occurred but this is significant. Pt is aware that he has lost weight but is unsure how much. RD discussed with pt the importance of adequate nutrition needed to preserve lean muscle. Recommend continue Ensure supplements and vitamins. RD will add Magic Cups to meal trays. Pt is at high refeed risk. Pt with widespread ecchymosis. Pt is receiving vitamin C supplementation.   Medications reviewed and include: vitamin C, aspirin, folic acid, heparin, iron polysaccharides, MVI, protonix, thiamine, B12,  vitamin D, ceftriaxone   Labs reviewed: K 3.4(L), BUN 6(L), creat 0.49(L), P 2.5 wnl, Mg 1.9 wnl Iron 29(L), TIBC 116(L), folate 3.1(L), vitamin D 30.66, B12- 328- 1/8 Wbc- 11.8(H), Hgb 9.2(L), Hct 27.0(L), MCV 101.9(H), MCH 34.7(H)  NUTRITION - FOCUSED PHYSICAL EXAM:  Flowsheet Row Most Recent Value  Orbital Region Severe depletion  Upper Arm Region Severe depletion  Thoracic and Lumbar Region Severe depletion  Buccal Region Severe depletion  Temple Region Severe depletion  Clavicle Bone Region Severe depletion  Clavicle and Acromion Bone Region Severe depletion  Scapular Bone Region Severe depletion  Dorsal Hand Severe depletion  Patellar Region Severe depletion  Anterior Thigh Region Severe depletion  Posterior Calf Region Severe depletion  Edema (RD Assessment) None  Hair Reviewed  Eyes Reviewed  Mouth Reviewed  Skin Reviewed  Nails Reviewed   Diet Order:   Diet Order             Diet regular Room service appropriate? Yes; Fluid consistency: Thin  Diet effective now                  EDUCATION NEEDS:   Education needs have been addressed  Skin:  Skin Assessment: Reviewed RN Assessment (ecchymosis)  Last BM:  pta  Height:   Ht Readings from Last 1 Encounters:  01/30/21 6' (1.829 m)    Weight:   Wt Readings from Last 1 Encounters:  02/01/21 54 kg    Ideal Body Weight:  80.9 kg  BMI:  Body mass index is 16.15 kg/m.  Estimated Nutritional  Needs:   Kcal:  1800-2100kcal/day  Protein:  90-105g/day  Fluid:  1.4-1.6L/day  Koleen Distance MS, RD, LDN Please refer to [facility] for RD and/or RD on-call/weekend/after hours pager

## 2021-02-02 NOTE — TOC Progression Note (Signed)
Transition of Care [facility]) - Progression Note    Patient Details  Name: [name] MRN: [id number] Date of Birth: March 29, 1950  Transition of [facility] CM/SW Contact  Chapman Fitch, RN Phone Number: 02/02/2021, 1:51 PM  Clinical Narrative:     Bed offers presented to patient, he defers to his brother [name] to make decision.  [name] accepts bed at [facility] [place] confirms they can admit tomorrow  Auth started in navi portal Requested MD to order covid test   Expected Discharge Plan: Skilled Nursing Facility Barriers to Discharge: Continued Medical Work up  Expected Discharge Plan and Services Expected Discharge Plan: Skilled Nursing Facility   Discharge Planning Services: CM Consult   Living arrangements for the past 2 months: Single Family Home                                       Social Determinants of Health (SDOH) Interventions    Readmission Risk Interventions No flowsheet data found.

## 2021-02-02 NOTE — TOC Initial Note (Signed)
Transition of Care [facility]) - Initial/Assessment Note    Patient Details  Name: [name] MRN: [id number] Date of Birth: 1950-02-08  Transition of Care [facility]) CM/SW Contact:    Chapman Fitch, RN Phone Number: 02/02/2021, 9:51 AM  Clinical Narrative:                 Admitted [id number] Admitted from: home alone PCP: [facility] - patient states at baseline he drives himseld Current home health/prior home health/DME: [facility], but states he does not need them at baseline  PT and OT recommending SNF.  Patient agreeable to bed search.  Obtained PASRR Fl2 sent for signature.  Bed search initiated.   Patient agreeable for me to update his brother Iantha Fallen.  Updated him via phone    Expected Discharge Plan: Skilled Nursing Facility Barriers to Discharge: Continued Medical Work up   Patient Goals and CMS Choice        Expected Discharge Plan and Services Expected Discharge Plan: Skilled Nursing Facility   Discharge Planning Services: CM Consult   Living arrangements for the past 2 months: Single Family Home                                      Prior Living Arrangements/Services Living arrangements for the past 2 months: Single Family Home Lives with:: Self Patient language and need for interpreter reviewed:: Yes Do you feel safe going back to the place where you live?: Yes      Need for Family Participation in Patient Care: Yes (Comment) Care giver support system in place?: Yes (comment) Current home services: DME Criminal Activity/Legal Involvement Pertinent to Current Situation/Hospitalization: No - Comment as needed  Activities of Daily Living Home Assistive Devices/Equipment: None ADL Screening (condition at time of admission) Patient's cognitive ability adequate to safely complete daily activities?: Yes Is the patient deaf or have difficulty hearing?: No Does the patient have difficulty seeing, even when wearing glasses/contacts?: No Does the  patient have difficulty concentrating, remembering, or making decisions?: No Patient able to express need for assistance with ADLs?: Yes Does the patient have difficulty dressing or bathing?: Yes Independently performs ADLs?: No Communication: Independent Dressing (OT): Needs assistance Is this a change from baseline?: Pre-admission baseline Grooming: Needs assistance Is this a change from baseline?: Pre-admission baseline Feeding: Independent Bathing: Needs assistance Is this a change from baseline?: Pre-admission baseline Toileting: Needs assistance Is this a change from baseline?: Pre-admission baseline In/Out Bed: Needs assistance Is this a change from baseline?: Pre-admission baseline Walks in Home: Needs assistance Is this a change from baseline?: Pre-admission baseline Does the patient have difficulty walking or climbing stairs?: Yes Weakness of Legs: Both Weakness of Arms/Hands: None  Permission Sought/Granted      Share Information with NAME: Brother kenneth           Emotional Assessment       Orientation: : Oriented to Self, Oriented to Place, Oriented to Situation Alcohol / Substance Use: Not Applicable Psych Involvement: No (comment)  Admission diagnosis:  Dehydration [E86.0] Transaminitis [R74.01] Fall [W19.XXXA] Acute cystitis with hematuria [N30.01] Frequent falls [R29.6] Patient Active Problem List   Diagnosis Date Noted   Fall 01/31/2021   Falls 01/30/2021   UTI (urinary tract infection) 01/30/2021   Abnormal LFTs 01/30/2021   Severe sepsis (HCC) 01/30/2021   Dyslipidemia    Coronary artery disease    Cerebrovascular disease  SINUSITIS, CHRONIC NOS 04/28/2008   Hyperlipidemia 04/19/2008   Essential hypertension 04/19/2008   UNSTABLE ANGINA 04/19/2008   Coronary atherosclerosis 04/19/2008   PAIN IN JOINT, SHOULDER 06/05/2006   PCP:  Leonides Sake, MD Pharmacy:   [id number] - [place], [address] [place] [address] Phone: [phone] Fax: [phone]     Social Determinants of Health (SDOH) Interventions    Readmission Risk Interventions No flowsheet data found.

## 2021-02-02 NOTE — Progress Notes (Signed)
[facility] Progress Note  Patient: [name]    SKA:[id number]  DOA: 01/30/2021     Date of Service: the patient was seen and examined on 02/02/2021  Chief Complaint  Patient presents with   Failure To Thrive   Fall   Brief hospital course: [name] is a 71 y.o. male with past medical history of HTN, HLD, CAD, CVA, seen in ed with complaints of falls and decreased po intake lives alone.  Patient brought in for falls decreased p.o. intake, found to have a urinary tract infection. In the ED, met sepsis criteria and was given Rocephin and IV fluid rehydration.  Urine shows cloudy large hemoglobin large leukocytes many bacteria more than 50 WBCs along with hematuria.  Patient admitted for sepsis likely 2/2 UTI.  PT/OT recommending SNF placement   Subjective:  Patient denies any new complaints.  Reports feeling much better than when he first came in.  Patient was alert, awake, oriented x3.   Assessment and Plan: Principal Problem:   Falls Active Problems:   UTI (urinary tract infection)   Severe sepsis (HCC)   Abnormal LFTs   Essential hypertension   Coronary artery disease   Sepsis secondary to UTI  Patient meeting severe sepsis criteria with hypotension lactic acidosis is which is corrected with IV fluid rehydration.  Elevated WBC count and procalcitonin Urine culture growing Serratia BC x2 NGTD Continue Rocephin, may transition to Keflex upon discharge Monitor closely  Transaminitis Most likely secondary to sepsis and hypotension Noted alcohol abuse CT abdomen shows hepatic steatosis Hold statin for now Hepatitis panel, nonreactive Trend LFTs US abdominal GB sludge, no acute findings, hepatic steatosis  Hypokalemia Replace as needed  Falls Patient brought in for falls found to have urinary tract infection generalized weakness failure to thrive multiple complaints.   Continue fall precautions PT/OT recommending SNF   Hypertension Presented with  hypotension secondary to sepsis, BP currently stable Held antihypertensive medications for now TTE 60 to 20%, grade 2 diastolic dysfunction, no any other significant findings. Continue Lopressor 12.5 mg p.o. twice daily  CAD Stable denies any chest pains.  Troponin negative Continued aspirin 81 mg daily Held statin for now secondary to transaminitis Continue Lopressor  Macrocytic anemia Multifactorial due to mild iron deficiency, folic acid deficiency, vitamin B12 level 328, target >400 Possible history of alcohol abuse Started oral iron supplement, folate supplement and B12 supplement. Repeat iron profile B12 and folate level after 3 months, follow with PCP  Infrarenal abdominal aortic aneurysm, incidental finding on CT scan abdomen 4.4 cm infrarenal abdominal aortic aneurysm, patient was recommended to follow with PCP and vascular surgery as an outpatient for surveillance.    Body mass index is  16.15 Interventions:   PT and OT eval done, recommend SNF placement    Diet: Regular diet DVT Prophylaxis: Subcutaneous Heparin    Advance goals of care discussion: Full code  Family Communication:  None at bedside  Disposition:  Discharge to SNF     Physical Exam: General: NAD  Cardiovascular: S1, S2 present Respiratory: CTAB Abdomen: Soft, nontender, nondistended, bowel sounds present Musculoskeletal: No bilateral pedal edema noted Skin: Normal Psychiatry: Normal mood   Vitals:   02/02/21 0955 02/02/21 1104 02/02/21 1442 02/02/21 1542  BP: 94/68 106/68  126/85  Pulse: 89   (!) 102  Resp: 18   18  Temp: 97.8 F (36.6 C)  (!) 97.3 F (36.3 C) (!) 97.5 F (36.4 C)  TempSrc: Oral  Oral Oral  SpO2: 97%   98%  Weight:      Height:        Intake/Output Summary (Last 24 hours) at 02/02/2021 1844 Last data filed at 02/02/2021 1600 Gross per 24 hour  Intake 883 ml  Output 1075 ml  Net -192 ml   Filed Weights   01/30/21 1048 02/01/21 1221  Weight: 45.4 kg 54  kg    Data Reviewed: I have personally reviewed and interpreted daily labs, tele strips, imagings as discussed above. I reviewed all nursing notes, pharmacy notes, vitals, pertinent old records I have discussed plan of care as described above with RN and patient/family.  CBC: Recent Labs  Lab 01/30/21 1054 01/31/21 0429 02/01/21 0728 02/02/21 0442  WBC 10.5 22.0* 17.3* 11.8*  HGB 13.4 10.3* 9.3* 9.2*  HCT 38.3* 30.1* 27.5* 27.0*  MCV 101.6* 103.4* 105.0* 101.9*  PLT 328 211 198 099   Basic Metabolic Panel: Recent Labs  Lab 01/30/21 1054 01/31/21 0429 01/31/21 1023 01/31/21 2124 02/01/21 0728 02/02/21 0442  NA 137 138  --  135 136 138  K 3.6 2.8*  --  3.6 3.7 3.4*  CL 95* 106  --  105 108 109  CO2 32 24  --  23 21* 23  GLUCOSE 136* 92  --  77 62* 98  BUN 11 11  --  10 8 6*  CREATININE 0.53* 0.68  --  0.73 0.62 0.49*  CALCIUM 8.9 7.8*  --  7.8* 7.6* 8.0*  MG  --   --  1.6*  --  1.9 1.9  PHOS  --   --  3.5  --  1.9* 2.5    Studies: No results found.  Scheduled Meds:  vitamin C  500 mg Oral Daily   aspirin EC  81 mg Oral Daily   Chlorhexidine Gluconate Cloth  6 each Topical Daily   feeding supplement  237 mL Oral TID BM   folic acid  1 mg Oral Daily   heparin  5,000 Units Subcutaneous Q8H   iron polysaccharides  150 mg Oral Daily   metoprolol tartrate  12.5 mg Oral BID   [START ON 02/03/2021] multivitamin with minerals  1 tablet Oral Daily   pantoprazole  40 mg Oral Daily   sodium chloride flush  3 mL Intravenous Q12H   thiamine  100 mg Oral Daily   vitamin B-12  500 mcg Oral Daily   Vitamin D (Ergocalciferol)  50,000 Units Oral Q7 days   Continuous Infusions:  cefTRIAXone (ROCEPHIN)  IV 1 g (02/02/21 1415)   PRN Meds: acetaminophen **OR** acetaminophen, ondansetron (ZOFRAN) IV    Author: Alma Friendly, MD [facility] 02/02/2021 6:44 PM  To reach On-call, see care teams to locate the attending and reach out to them via [email or web address]. If  7PM-7AM, please contact night-coverage

## 2021-02-02 NOTE — Progress Notes (Signed)
Physical Therapy Treatment Patient Details Name: [name] MRN: [id number] DOB: 24-Jul-1950 Today's Date: 02/02/2021   History of Present Illness 71 y.o. male with past medical history of HTN, HLD, CAD, CVA, seen in ed with complaints of falls and decreased po intake; arrived with laceration on the head. Found to have a UTI.    PT Comments    Pt received sitting EOB, alarm sounding and RN just arriving in room. Pt attempting to mobilize throughout room without assistance. Pt performed STS with SUP and 175ft ambulation with CGA and RW. Pt limited distance due to reporting fatigue. He refused seated therex once returned to room. Pt appeared confused at end of session, unaware of where he is and stating he has to help family move furniture later today. Pt continues to be appropriate for SNF as she is not safe to return to living alone. If he is able to obtain 24 hour caregiver assistance, could change d/c rec to home with HHPT. Would benefit from skilled PT to address above deficits and promote optimal return to PLOF.     Recommendations for follow up therapy are one component of a multi-disciplinary discharge planning process, led by the attending physician.  Recommendations may be updated based on patient status, additional functional criteria and insurance authorization.  Follow Up Recommendations  Skilled nursing-short term rehab (<3 hours/day)     Assistance Recommended at Discharge Frequent or constant Supervision/Assistance  Patient can return home with the following A little help with walking and/or transfers;A little help with bathing/dressing/bathroom;Assistance with cooking/housework;Direct supervision/assist for financial management;Assist for transportation;Direct supervision/assist for medications management   Equipment Recommendations  Other (comment) (TBD at next venue of care)    Recommendations for Other Services       Precautions / Restrictions  Precautions Precautions: Fall Restrictions Weight Bearing Restrictions: No     Mobility  Bed Mobility               General bed mobility comments: not observed - pt sitting up at EOB and in recliner and end of session    Transfers Overall transfer level: Needs assistance Equipment used: Rolling walker (2 wheels) Transfers: Sit to/from Stand Sit to Stand: Supervision           General transfer comment: close SUP with RW for stability    Ambulation/Gait Ambulation/Gait assistance: Min guard Gait Distance (Feet): 130 Feet Assistive device: Rolling walker (2 wheels) Gait Pattern/deviations: Step-through pattern;Narrow base of support;Decreased stride length;Trunk flexed Gait velocity: decreased     General Gait Details: 138ft using RW. CGA and RW for stability. Pt limited by fatigue.   Stairs             Wheelchair Mobility    Modified Rankin (Stroke Patients Only)       Balance Overall balance assessment: Needs assistance Sitting-balance support: Feet supported;No upper extremity supported Sitting balance-Leahy Scale: Fair Sitting balance - Comments: sits EOB during assessment of vitals with no LOB   Standing balance support: Bilateral upper extremity supported;During functional activity Standing balance-Leahy Scale: Fair Standing balance comment: Improved balance during ambulation using RW today compared to IV pole yesterday. Does require CGA and AD.                            Cognition Arousal/Alertness: Awake/alert Behavior During Therapy: WFL for tasks assessed/performed Overall Cognitive Status: No family/caregiver present to determine baseline cognitive functioning  General Comments: Pt unaware of location. Begins talking about helping his family move furniture later today.        Exercises      General Comments        Pertinent Vitals/Pain Pain Assessment: No/denies pain     Home Living                          Prior Function            PT Goals (current goals can now be found in the care plan section) Acute Rehab PT Goals Patient Stated Goal: to get stronger PT Goal Formulation: With patient Time For Goal Achievement: 02/15/21 Potential to Achieve Goals: Fair    Frequency    Min 2X/week      PT Plan      Co-evaluation              AM-PAC PT "6 Clicks" Mobility   Outcome Measure  Help needed turning from your back to your side while in a flat bed without using bedrails?: None Help needed moving from lying on your back to sitting on the side of a flat bed without using bedrails?: A Little Help needed moving to and from a bed to a chair (including a wheelchair)?: A Little Help needed standing up from a chair using your arms (e.g., wheelchair or bedside chair)?: A Little Help needed to walk in hospital room?: A Little Help needed climbing 3-5 steps with a railing? : A Lot 6 Click Score: 18    End of Session Equipment Utilized During Treatment: Gait belt Activity Tolerance: Patient tolerated treatment well;Patient limited by fatigue Patient left: in bed;with call bell/phone within reach;with chair alarm set Nurse Communication: Mobility status PT Visit Diagnosis: Unsteadiness on feet (R26.81);Repeated falls (R29.6);Muscle weakness (generalized) (M62.81);Other abnormalities of gait and mobility (R26.89);Difficulty in walking, not elsewhere classified (R26.2);History of falling (Z91.81);Adult, failure to thrive (R62.7)     Time: 6599-3570 PT Time Calculation (min) (ACUTE ONLY): 14 min  Charges:  $Therapeutic Exercise: 8-22 mins                     Basilia Jumbo PT, DPT 02/02/21 4:27 PM [phone]

## 2021-02-02 NOTE — NC FL2 (Signed)
[place] MEDICAID FL2 LEVEL OF CARE SCREENING TOOL     IDENTIFICATION  Patient Name: [name] Birthdate: 05/11/1950 Sex: male Admission Date (Current Location): 01/30/2021  [facility] and [place] Number:  Chiropodist and Address:         Provider Number: [id number]  Attending Physician Name and Address:  Briant Cedar, MD  Relative Name and Phone Number:       Current Level of Care: Hospital Recommended Level of Care: Skilled Nursing Facility Prior Approval Number:    Date Approved/Denied:   PASRR Number: [id number]  Discharge Plan: SNF    Current Diagnoses: Patient Active Problem List   Diagnosis Date Noted   Fall 01/31/2021   Falls 01/30/2021   UTI (urinary tract infection) 01/30/2021   Abnormal LFTs 01/30/2021   Severe sepsis (HCC) 01/30/2021   Dyslipidemia    Coronary artery disease    Cerebrovascular disease    SINUSITIS, CHRONIC NOS 04/28/2008   Hyperlipidemia 04/19/2008   Essential hypertension 04/19/2008   UNSTABLE ANGINA 04/19/2008   Coronary atherosclerosis 04/19/2008   PAIN IN JOINT, SHOULDER 06/05/2006    Orientation RESPIRATION BLADDER Height & Weight     Self, Situation, Place  Normal Indwelling catheter Weight: 54 kg Height:  6' (182.9 cm)  BEHAVIORAL SYMPTOMS/MOOD NEUROLOGICAL BOWEL NUTRITION STATUS      Continent Diet (Regular)  AMBULATORY STATUS COMMUNICATION OF NEEDS Skin   Limited Assist Verbally Bruising, Skin abrasions                       Personal Care Assistance Level of Assistance              Functional Limitations Info             SPECIAL CARE FACTORS FREQUENCY  PT (By licensed PT), OT (By licensed OT)                    Contractures Contractures Info: Not present    Additional Factors Info  Code Status, Allergies Code Status Info: Full Allergies Info: NKDA           Current Medications (02/02/2021):  This is the current hospital active medication list Current  Facility-Administered Medications  Medication Dose Route Frequency Provider Last Rate Last Admin   0.9 %  sodium chloride infusion   Intravenous Continuous Gillis Santa, MD 50 mL/hr at 02/01/21 1748 Rate Change at 02/01/21 1748   acetaminophen (TYLENOL) tablet 650 mg  650 mg Oral Q6H PRN Gertha Calkin, MD       Or   acetaminophen (TYLENOL) suppository 650 mg  650 mg Rectal Q6H PRN Gertha Calkin, MD       ascorbic acid (VITAMIN C) tablet 500 mg  500 mg Oral Daily Gillis Santa, MD   500 mg at 02/02/21 0815   aspirin EC tablet 81 mg  81 mg Oral Daily Irena Cords V, MD   81 mg at 02/02/21 0817   cefTRIAXone (ROCEPHIN) 1 g in sodium chloride 0.9 % 100 mL IVPB  1 g Intravenous Q24H Gillis Santa, MD   Stopped at 02/01/21 1658   Chlorhexidine Gluconate Cloth 2 % PADS 6 each  6 each Topical Daily Gertha Calkin, MD   6 each at 02/02/21 0827   feeding supplement (ENSURE ENLIVE / ENSURE PLUS) liquid 237 mL  237 mL Oral TID BM Gillis Santa, MD   237 mL at 02/02/21 0813   folic acid (FOLVITE) tablet  1 mg  1 mg Oral Daily Gillis Santa, MD   1 mg at 02/02/21 0813   heparin injection 5,000 Units  5,000 Units Subcutaneous Q8H Gertha Calkin, MD   5,000 Units at 02/02/21 7209   iron polysaccharides (NIFEREX) capsule 150 mg  150 mg Oral Daily Gillis Santa, MD   150 mg at 02/02/21 0818   metoprolol tartrate (LOPRESSOR) tablet 12.5 mg  12.5 mg Oral BID Gillis Santa, MD   12.5 mg at 02/02/21 0814   ondansetron (ZOFRAN) injection 4 mg  4 mg Intravenous Q6H PRN Gillis Santa, MD       pantoprazole (PROTONIX) EC tablet 40 mg  40 mg Oral Daily Gillis Santa, MD   40 mg at 02/02/21 0816   sodium chloride flush (NS) 0.9 % injection 3 mL  3 mL Intravenous Q12H Irena Cords V, MD   3 mL at 02/02/21 4709   thiamine tablet 100 mg  100 mg Oral Daily Gillis Santa, MD   100 mg at 02/02/21 6283   vitamin B-12 (CYANOCOBALAMIN) tablet 500 mcg  500 mcg Oral Daily Gillis Santa, MD   500 mcg at 02/02/21 6629   Vitamin D  (Ergocalciferol) (DRISDOL) capsule 50,000 Units  50,000 Units Oral Q7 days Gillis Santa, MD   50,000 Units at 01/31/21 1608     Discharge Medications: Please see discharge summary for a list of discharge medications.  Relevant Imaging Results:  Relevant Lab Results:   Additional Information ss [id number]  Chapman Fitch, RN

## 2021-02-02 NOTE — Care Management Important Message (Signed)
Important Message  Patient Details  Name: [name] MRN: [id number] Date of Birth: August 31, 1950   Medicare Important Message Given:  Yes     [name] 02/02/2021, 12:37 PM

## 2021-02-03 DIAGNOSIS — R7989 Other specified abnormal findings of blood chemistry: Secondary | ICD-10-CM | POA: Diagnosis not present

## 2021-02-03 DIAGNOSIS — E538 Deficiency of other specified B group vitamins: Secondary | ICD-10-CM | POA: Diagnosis not present

## 2021-02-03 DIAGNOSIS — R404 Transient alteration of awareness: Secondary | ICD-10-CM | POA: Diagnosis not present

## 2021-02-03 DIAGNOSIS — W19XXXS Unspecified fall, sequela: Secondary | ICD-10-CM | POA: Diagnosis not present

## 2021-02-03 DIAGNOSIS — I25118 Atherosclerotic heart disease of native coronary artery with other forms of angina pectoris: Secondary | ICD-10-CM | POA: Diagnosis not present

## 2021-02-03 DIAGNOSIS — Z743 Need for continuous supervision: Secondary | ICD-10-CM | POA: Diagnosis not present

## 2021-02-03 DIAGNOSIS — I714 Abdominal aortic aneurysm, without rupture, unspecified: Secondary | ICD-10-CM | POA: Diagnosis not present

## 2021-02-03 DIAGNOSIS — E782 Mixed hyperlipidemia: Secondary | ICD-10-CM | POA: Diagnosis not present

## 2021-02-03 DIAGNOSIS — A419 Sepsis, unspecified organism: Secondary | ICD-10-CM | POA: Diagnosis not present

## 2021-02-03 DIAGNOSIS — R6520 Severe sepsis without septic shock: Secondary | ICD-10-CM | POA: Diagnosis not present

## 2021-02-03 DIAGNOSIS — N390 Urinary tract infection, site not specified: Secondary | ICD-10-CM | POA: Diagnosis not present

## 2021-02-03 DIAGNOSIS — I10 Essential (primary) hypertension: Secondary | ICD-10-CM | POA: Diagnosis not present

## 2021-02-03 DIAGNOSIS — E43 Unspecified severe protein-calorie malnutrition: Secondary | ICD-10-CM | POA: Diagnosis not present

## 2021-02-03 DIAGNOSIS — Z7982 Long term (current) use of aspirin: Secondary | ICD-10-CM | POA: Diagnosis not present

## 2021-02-03 DIAGNOSIS — R5381 Other malaise: Secondary | ICD-10-CM | POA: Diagnosis not present

## 2021-02-03 DIAGNOSIS — I7143 Infrarenal abdominal aortic aneurysm, without rupture: Secondary | ICD-10-CM | POA: Diagnosis not present

## 2021-02-03 DIAGNOSIS — F17210 Nicotine dependence, cigarettes, uncomplicated: Secondary | ICD-10-CM | POA: Diagnosis not present

## 2021-02-03 DIAGNOSIS — R296 Repeated falls: Secondary | ICD-10-CM | POA: Diagnosis not present

## 2021-02-03 DIAGNOSIS — R627 Adult failure to thrive: Secondary | ICD-10-CM | POA: Diagnosis not present

## 2021-02-03 DIAGNOSIS — R410 Disorientation, unspecified: Secondary | ICD-10-CM | POA: Diagnosis not present

## 2021-02-03 DIAGNOSIS — E559 Vitamin D deficiency, unspecified: Secondary | ICD-10-CM | POA: Diagnosis not present

## 2021-02-03 DIAGNOSIS — D509 Iron deficiency anemia, unspecified: Secondary | ICD-10-CM | POA: Diagnosis not present

## 2021-02-03 DIAGNOSIS — E876 Hypokalemia: Secondary | ICD-10-CM | POA: Diagnosis not present

## 2021-02-03 DIAGNOSIS — E440 Moderate protein-calorie malnutrition: Secondary | ICD-10-CM | POA: Diagnosis not present

## 2021-02-03 DIAGNOSIS — W19XXXA Unspecified fall, initial encounter: Secondary | ICD-10-CM | POA: Diagnosis not present

## 2021-02-03 DIAGNOSIS — N3001 Acute cystitis with hematuria: Secondary | ICD-10-CM | POA: Diagnosis not present

## 2021-02-03 DIAGNOSIS — R799 Abnormal finding of blood chemistry, unspecified: Secondary | ICD-10-CM | POA: Diagnosis not present

## 2021-02-03 DIAGNOSIS — I2510 Atherosclerotic heart disease of native coronary artery without angina pectoris: Secondary | ICD-10-CM | POA: Diagnosis not present

## 2021-02-03 LAB — BASIC METABOLIC PANEL
Anion gap: 6 (ref 5–15)
BUN: 6 mg/dL — ABNORMAL LOW (ref 8–23)
CO2: 23 mmol/L (ref 22–32)
Calcium: 8.5 mg/dL — ABNORMAL LOW (ref 8.9–10.3)
Chloride: 107 mmol/L (ref 98–111)
Creatinine, Ser: 0.52 mg/dL — ABNORMAL LOW (ref 0.61–1.24)
GFR, Estimated: >60 mL/min (ref >60)
Glucose, Bld: 93 mg/dL (ref 70–99)
Potassium: 3.9 mmol/L (ref 3.5–5.1)
Sodium: 136 mmol/L (ref 135–145)

## 2021-02-03 LAB — HEPATIC FUNCTION PANEL
ALT: 36 U/L (ref 0–44)
AST: 40 U/L (ref 15–41)
Albumin: 2.6 g/dL — ABNORMAL LOW (ref 3.5–5.0)
Alkaline Phosphatase: 123 U/L (ref 38–126)
Bilirubin, Direct: 0.2 mg/dL (ref 0.0–0.2)
Indirect Bilirubin: 0.6 mg/dL (ref 0.3–0.9)
Total Bilirubin: 0.8 mg/dL (ref 0.3–1.2)
Total Protein: 5.6 g/dL — ABNORMAL LOW (ref 6.5–8.1)

## 2021-02-03 LAB — CBC
HCT: 28.3 % — ABNORMAL LOW (ref 39.0–52.0)
Hemoglobin: 9.7 g/dL — ABNORMAL LOW (ref 13.0–17.0)
MCH: 35.1 pg — ABNORMAL HIGH (ref 26.0–34.0)
MCHC: 34.3 g/dL (ref 30.0–36.0)
MCV: 102.5 fL — ABNORMAL HIGH (ref 80.0–100.0)
Platelets: 208 10*3/uL (ref 150–400)
RBC: 2.76 MIL/uL — ABNORMAL LOW (ref 4.22–5.81)
RDW: 13.6 % (ref 11.5–15.5)
WBC: 8.4 10*3/uL (ref 4.0–10.5)
nRBC: 0 % (ref 0.0–0.2)

## 2021-02-03 LAB — MAGNESIUM: Magnesium: 1.7 mg/dL (ref 1.7–2.4)

## 2021-02-03 LAB — PHOSPHORUS: Phosphorus: 2.4 mg/dL — ABNORMAL LOW (ref 2.5–4.6)

## 2021-02-03 LAB — SARS CORONAVIRUS 2 (TAT 6-24 HRS): SARS Coronavirus 2: NEGATIVE

## 2021-02-03 MED ORDER — FOLIC ACID 1 MG PO TABS: 1.0000 mg | ORAL_TABLET | Freq: Every day | ORAL | Status: DC

## 2021-02-03 MED ORDER — VITAMIN D (ERGOCALCIFEROL) 1.25 MG (50000 UNIT) PO CAPS: 50000.0000 [IU] | ORAL_CAPSULE | ORAL | 0 refills | Status: DC

## 2021-02-03 MED ORDER — ASCORBIC ACID 500 MG PO TABS: 500.0000 mg | ORAL_TABLET | Freq: Every day | ORAL | Status: DC

## 2021-02-03 MED ORDER — ENSURE ENLIVE PO LIQD: 237.0000 mL | Freq: Three times a day (TID) | ORAL | 12 refills | Status: DC

## 2021-02-03 MED ORDER — POLYSACCHARIDE IRON COMPLEX 150 MG PO CAPS: 150.0000 mg | ORAL_CAPSULE | Freq: Every day | ORAL | Status: DC

## 2021-02-03 MED ORDER — THIAMINE HCL 100 MG PO TABS: 100.0000 mg | ORAL_TABLET | Freq: Every day | ORAL | Status: DC

## 2021-02-03 MED ORDER — ADULT MULTIVITAMIN W/MINERALS CH: 1.0000 | ORAL_TABLET | Freq: Every day | ORAL | Status: DC

## 2021-02-03 MED ORDER — CEPHALEXIN 500 MG PO CAPS: 500.0000 mg | ORAL_CAPSULE | Freq: Three times a day (TID) | ORAL | 0 refills | Status: AC

## 2021-02-03 MED ORDER — CYANOCOBALAMIN 500 MCG PO TABS: 500.0000 ug | ORAL_TABLET | Freq: Every day | ORAL | Status: DC

## 2021-02-03 NOTE — Discharge Summary (Signed)
Discharge Summary  [name] DSK:[id number] DOB: [birth year]  PCP: Leonides Sake, MD  Admit date: 01/30/2021 Discharge date: 02/03/2021  Time spent: 40 mins  Recommendations for Outpatient Follow-up:  PCP in 1 week Cardiology as scheduled   Discharge Diagnoses:  Active Hospital Problems   Diagnosis Date Noted   Falls 01/30/2021   Protein-calorie malnutrition, severe 02/02/2021   Fall 01/31/2021   UTI (urinary tract infection) 01/30/2021   Abnormal LFTs 01/30/2021   Severe sepsis ([place]) 01/30/2021   Coronary artery disease    Essential hypertension 04/19/2008    Resolved Hospital Problems   Diagnosis Date Noted Date Resolved   Fall 01/30/2021 01/30/2021    Discharge Condition: Stable  Diet recommendation: Regular  Vitals:   02/03/21 0449 02/03/21 0840  BP: 119/83 124/88  Pulse: 91 84  Resp: 18 18  Temp: 97.8 F (36.6 C) (!) 97.5 F (36.4 C)  SpO2: 97% 100%    History of present illness:  [name] is a 71 y.o. male with past medical history of HTN, HLD, CAD, CVA, seen in ed with complaints of falls and decreased po intake lives alone.  Patient brought in for falls decreased p.o. intake, found to have a urinary tract infection. In the ED, met sepsis criteria and was given Rocephin and IV fluid rehydration.  Urine shows cloudy large hemoglobin large leukocytes many bacteria more than 50 WBCs along with hematuria.  Patient admitted for sepsis likely 2/2 UTI.  PT/OT recommending SNF placement.    Today, patient denies any new complaints, noted to be intermittently confused.  Suspect this has been ongoing for a while as patient lives alone and no family to report any form of cognitive impairment.  Discussed extensively with brother today, who stated patient lives alone and does not meet with family members regularly.  This may be mild cognitive impairment versus early phase of dementia.  Patient will be discharged to SNF and advised that his PCP should  follow-up.  Patient may require caregivers after discharge from SNF.   Hospital Course:  Principal Problem:   Falls Active Problems:   Essential hypertension   Coronary artery disease   UTI (urinary tract infection)   Abnormal LFTs   Severe sepsis ([facility])   Fall   Protein-calorie malnutrition, severe   Sepsis secondary to UTI  Patient meeting severe sepsis criteria with hypotension lactic acidosis is which is corrected with IV fluid rehydration.  Elevated WBC count and procalcitonin Urine culture growing Serratia BC x2 NGTD S/p Rocephin-->transition to Keflex for another 2 days, to complete 5 days Monitor closely   Transaminitis Resolved  Most likely secondary to sepsis and hypotension Noted alcohol abuse CT abdomen shows hepatic steatosis Hepatitis panel, non-reactive US abdominal GB sludge, no acute findings, hepatic steatosis   Hypokalemia Replaced as needed   Falls Patient brought in for falls found to have urinary tract infection generalized weakness failure to thrive multiple complaints Continue fall precautions PT/OT recommended SNF   Hypertension Presented with hypotension secondary to sepsis, BP currently stable May restart antihypertensive medications for now and follow BP TTE 60 to 62%, grade 2 diastolic dysfunction, no any other significant findings   CAD Stable denies any chest pains.  Troponin negative Continued aspirin 81 mg daily Restart statin   Macrocytic anemia Multifactorial due to mild iron deficiency, folic acid deficiency, vitamin B12 level 328, target >400 History of alcohol abuse Started oral iron supplement, folate supplement and B12 supplement Repeat iron profile B12 and folate level  after 3 months, follow with PCP   Infrarenal abdominal aortic aneurysm, incidental finding on CT scan abdomen 4.4 cm infrarenal abdominal aortic aneurysm, patient was recommended to follow with PCP and vascular surgery as an outpatient for surveillance.        Malnutrition Type:  Nutrition Problem: Severe Malnutrition Etiology: social / environmental circumstances   Malnutrition Characteristics:  Signs/Symptoms: severe fat depletion, severe muscle depletion, percent weight loss Percent weight loss: 12 %   Nutrition Interventions:      Estimated body mass index is 16.15 kg/m as calculated from the following:   Height as of this encounter: 6' (1.829 m).   Weight as of this encounter: 54 kg.    Procedures: None  Consultations: None  Discharge Exam: BP 124/88 (BP Location: Left Arm)    Pulse 84    Temp (!) 97.5 F (36.4 C) (Oral)    Resp 18    Ht 6' (1.829 m)    Wt 54 kg    SpO2 100%    BMI 16.15 kg/m   General: NAD, intermittently confused Cardiovascular: S1, S2 present Respiratory: CTAB Abdomen: Soft, nontender, nondistended, bowel sounds present Musculoskeletal: No bilateral pedal edema noted Skin: Noted multiple bruising, superficial wounds on BUE Psychiatry: Normal mood     Discharge Instructions You were cared for by a hospitalist during your hospital stay. If you have any questions about your discharge medications or the care you received while you were in the hospital after you are discharged, you can call the unit and asked to speak with the hospitalist on call if the hospitalist that took care of you is not available. Once you are discharged, your primary care physician will handle any further medical issues. Please note that NO REFILLS for any discharge medications will be authorized once you are discharged, as it is imperative that you return to your primary care physician (or establish a relationship with a primary care physician.  You do not have one) for your aftercare needs so that they can reassess your need for medications and monitor your lab values.   Allergies as of 02/03/2021   No Known Allergies      Medication List     TAKE these medications    amLODipine 5 MG tablet Commonly known as:  NORVASC TAKE 1 TABLET (5 MG TOTAL) BY MOUTH DAILY.   ascorbic acid 500 MG tablet Commonly known as: VITAMIN C Take 1 tablet (500 mg total) by mouth daily. Start taking on: February 04, 2021   aspirin 81 MG tablet Take 81 mg by mouth daily.   cephALEXin 500 MG capsule Commonly known as: KEFLEX Take 1 capsule (500 mg total) by mouth 3 (three) times daily for 2 days.   feeding supplement Liqd Take 237 mLs by mouth 3 (three) times daily between meals.   folic acid 1 MG tablet Commonly known as: FOLVITE Take 1 tablet (1 mg total) by mouth daily. Start taking on: February 04, 2021   iron polysaccharides 150 MG capsule Commonly known as: NIFEREX Take 1 capsule (150 mg total) by mouth daily. Start taking on: February 04, 2021   isosorbide mononitrate 30 MG 24 hr tablet Commonly known as: IMDUR Take 1 tablet (30 mg total) by mouth daily.   metoprolol succinate 25 MG 24 hr tablet Commonly known as: TOPROL-XL TAKE 1 TABLET BY MOUTH DAILY. PLEASE KEEP UPCOMING APPT IN JUNE 2022 BEFORE ANYMORE REFILLS.   multivitamin with minerals Tabs tablet Take 1 tablet by mouth daily. Start taking  on: February 04, 2021   simvastatin 40 MG tablet Commonly known as: ZOCOR Take 1 tablet (40 mg total) by mouth daily. at 6pm   thiamine 100 MG tablet Take 1 tablet (100 mg total) by mouth daily. Start taking on: February 04, 2021   vitamin B-12 500 MCG tablet Commonly known as: CYANOCOBALAMIN Take 1 tablet (500 mcg total) by mouth daily. Start taking on: February 04, 2021   Vitamin D (Ergocalciferol) 1.25 MG (50000 UNIT) Caps capsule Commonly known as: DRISDOL Take 1 capsule (50,000 Units total) by mouth every 7 (seven) days. Start taking on: February 07, 2021       No Known Allergies  Contact information for follow-up providers     Hamrick, Maura L, MD. Schedule an appointment as soon as possible for a visit in 1 week(s).   Specialty: Family Medicine Contact information: [place] [address] [phone]         Fay Records, MD .   Specialty: Cardiology Contact information: [place] [address] [phone]              Contact information for after-discharge care     [facility] Preferred SNF .   Service: Skilled Nursing Contact information: [place] [phone]                      The results of significant diagnostics from this hospitalization (including imaging, microbiology, ancillary and laboratory) are listed below for reference.    Significant Diagnostic Studies: CT HEAD WO CONTRAST  Result Date: 01/30/2021 CLINICAL DATA:  Head trauma, minor.  Fall. EXAM: CT HEAD WITHOUT CONTRAST TECHNIQUE: Contiguous axial images were obtained from the base of the skull through the vertex without intravenous contrast. COMPARISON:  No recent examination available for comparison. MRI examination dated January 31, 2004. FINDINGS: Brain: No evidence of acute infarction, hemorrhage, hydrocephalus, extra-axial collection or mass lesion/mass effect. Mild cerebral atrophy and chronic microvascular ischemic changes of the white matter. Vascular: No hyperdense vessel or unexpected calcification. Skull: Normal. Negative for fracture or focal lesion. Sinuses/Orbits: Hyperostosis of the left maxillary sinus, likely secondary to chronic sinus disease. Opacification of the left sphenoid sinus. Other: None. IMPRESSION: 1. No acute intracranial abnormality. 2. Mild cerebral volume loss and chronic microvascular ischemic changes of the white matter. 3. Chronic paranasal sinus disease. Electronically Signed   By: Keane Police D.O.   On: 01/30/2021 11:43   CT Cervical Spine Wo Contrast  Result Date: 01/30/2021 CLINICAL DATA:  Multiple falls. EXAM: CT CERVICAL SPINE WITHOUT CONTRAST TECHNIQUE:  Multidetector CT imaging of the cervical spine was performed without intravenous contrast. Multiplanar CT image reconstructions were also generated. COMPARISON:  None. FINDINGS: Alignment: There is mild retrolisthesis C5 on C6, likely related to chronic spondylosis. The alignment is otherwise normal. Skull base and vertebrae: No acute fracture. No primary bone lesion or focal pathologic process. Soft tissues and spinal canal: No prevertebral fluid or swelling. No visible canal hematoma. Disc levels: Marked disc space narrowing and endplate spurring identified at C4-5 C5-6 and C6-7. Upper chest: Advanced changes of paraseptal and centrilobular emphysema noted. Other: None IMPRESSION: 1. No evidence for cervical spine fracture. 2. Advanced cervical degenerative disc disease. 3. Emphysema (ICD10-J43.9). Electronically Signed   By: Kerby Moors M.D.   On: 01/30/2021 14:37   CT ABDOMEN  PELVIS W CONTRAST  Result Date: 01/30/2021 CLINICAL DATA:  Nausea, vomiting EXAM: CT ABDOMEN AND PELVIS WITH CONTRAST TECHNIQUE: Multidetector CT imaging of the abdomen and pelvis was performed using the standard protocol following bolus administration of intravenous contrast. CONTRAST:  35m OMNIPAQUE IOHEXOL 300 MG/ML  SOLN COMPARISON:  None. FINDINGS: Lower chest: Aortic and coronary artery calcifications. No confluent opacities or effusions. Hepatobiliary: Diffuse heterogeneous low-density throughout the liver compatible with geographic fatty infiltration. Gallbladder unremarkable. Pancreas: No focal abnormality or ductal dilatation. Spleen: No focal abnormality.  Normal size. Adrenals/Urinary Tract: No adrenal abnormality. No focal renal abnormality. No stones or hydronephrosis. Urinary bladder is unremarkable. Stomach/Bowel: Scattered left colonic diverticulosis. No active diverticulitis. Stomach and small bowel decompressed. No bowel obstruction. Vascular/Lymphatic: 4.4 cm infrarenal abdominal aortic aneurysm with extensive  mural plaque. No adenopathy. Reproductive: No visible focal abnormality. Other: No free fluid or free air. Musculoskeletal: No acute bony abnormality IMPRESSION: Severe hepatic steatosis. 4.4 cm infrarenal abdominal aortic aneurysm. Coronary artery disease, aortic atherosclerosis. Electronically Signed   By: KRolm BaptiseM.D.   On: 01/30/2021 19:37   DG Chest Portable 1 View  Result Date: 01/30/2021 CLINICAL DATA:  Weakness EXAM: PORTABLE CHEST 1 VIEW COMPARISON:  06/01/2006 FINDINGS: Hyperinflation with emphysema. Mild bronchitic changes at the bases. No consolidation, pleural effusion, or pneumothorax. Multiple chronic left-sided rib fractures. IMPRESSION: No active disease.  Emphysema and bronchitic changes Electronically Signed   By: KDonavan FoilM.D.   On: 01/30/2021 17:30   ECHOCARDIOGRAM COMPLETE  Result Date: 01/31/2021    ECHOCARDIOGRAM REPORT   Patient Name:   MANN GROENEVELDDate of Exam: 01/31/2021 Medical Rec #:  [id number]    Height:       72.0 in Accession #:    [id number]   Weight:       100.0 lb Date of Birth:  [birth year]    BSA:          1.587 m Patient Age:    89years      BP:           78/51 mmHg Patient Gender: M             HR:           82 bpm. Exam Location:  [facility] Procedure: 2D Echo Indications:     Chest Pain R07.9  History:         Patient has no prior history of Echocardiogram examinations.  Sonographer:     TKathlen BrunswickRDCS Referring Phys:  [id number] RilesDiagnosing Phys: TIda RogueMD  Sonographer Comments: Technically difficult study due to poor echo windows. Image acquisition challenging due to patient body habitus. IMPRESSIONS  1. Left ventricular ejection fraction, by estimation, is 60 to 65%. The left ventricle has normal function. The left ventricle has no regional wall motion abnormalities. Left ventricular diastolic parameters are consistent with Grade II diastolic dysfunction (pseudonormalization).  2. Right ventricular systolic function is normal. The right  ventricular size is normal.  3. The mitral valve is normal in structure. No evidence of mitral valve regurgitation. No evidence of mitral stenosis.  4. The aortic valve is normal in structure. Aortic valve regurgitation is not visualized. No aortic stenosis is present.  5. The inferior vena cava is normal in size with greater than 50% respiratory variability, suggesting right atrial pressure of 3 mmHg. FINDINGS  Left Ventricle: Left ventricular ejection fraction, by estimation, is 60 to 65%. The left ventricle has normal function. The  left ventricle has no regional wall motion abnormalities. The left ventricular internal cavity size was normal in size. There is  no left ventricular hypertrophy. Left ventricular diastolic parameters are consistent with Grade II diastolic dysfunction (pseudonormalization). Right Ventricle: The right ventricular size is normal. No increase in right ventricular wall thickness. Right ventricular systolic function is normal. Left Atrium: Left atrial size was normal in size. Right Atrium: Right atrial size was normal in size. Pericardium: There is no evidence of pericardial effusion. Mitral Valve: The mitral valve is normal in structure. There is mild calcification of the mitral valve leaflet(s). No evidence of mitral valve regurgitation. No evidence of mitral valve stenosis. Tricuspid Valve: The tricuspid valve is normal in structure. Tricuspid valve regurgitation is mild . No evidence of tricuspid stenosis. Aortic Valve: The aortic valve is normal in structure. Aortic valve regurgitation is not visualized. No aortic stenosis is present. Aortic valve peak gradient measures 7.8 mmHg. Pulmonic Valve: The pulmonic valve was normal in structure. Pulmonic valve regurgitation is not visualized. No evidence of pulmonic stenosis. Aorta: The aortic root is normal in size and structure. Venous: The inferior vena cava is normal in size with greater than 50% respiratory variability, suggesting right  atrial pressure of 3 mmHg. IAS/Shunts: No atrial level shunt detected by color flow Doppler.  LEFT VENTRICLE PLAX 2D LVIDd:         4.60 cm Diastology LVIDs:         3.20 cm LV e' medial:    8.16 cm/s LV PW:         0.80 cm LV E/e' medial:  7.8 LV IVS:        0.90 cm LV e' lateral:   7.83 cm/s                        LV E/e' lateral: 8.1  RIGHT VENTRICLE RV Basal diam:  3.60 cm RV S prime:     11.20 cm/s TAPSE (M-mode): 1.8 cm LEFT ATRIUM           Index        RIGHT ATRIUM          Index LA diam:      3.20 cm 2.02 cm/m   RA Area:     9.87 cm LA Vol (A4C): 17.7 ml 11.15 ml/m  RA Volume:   21.00 ml 13.23 ml/m  AORTIC VALVE AV Vmax:      140.00 cm/s AV Peak Grad: 7.8 mmHg LVOT Vmax:    51.60 cm/s LVOT Vmean:   33.500 cm/s LVOT VTI:     0.105 m MITRAL VALVE               TRICUSPID VALVE MV Area (PHT): 4.39 cm    TV Peak grad:   19.5 mmHg MV Decel Time: 173 msec    TV Vmax:        2.21 m/s MV E velocity: 63.40 cm/s MV A velocity: 44.10 cm/s  SHUNTS MV E/A ratio:  1.44        Systemic VTI: 0.10 m Ida Rogue MD Electronically signed by Ida Rogue MD Signature Date/Time: 01/31/2021/1:43:51 PM    Final    US Abdomen Limited RUQ (LIVER/GB)  Result Date: 01/31/2021 CLINICAL DATA:  Transaminitis. EXAM: ULTRASOUND ABDOMEN LIMITED RIGHT UPPER QUADRANT COMPARISON:  None. FINDINGS: Gallbladder: Minimally distended. No wall thickening. No shadowing stone. Material in the upper gallbladder is consistent with sludge. No pericholecystic fluid. Common bile duct: Diameter:  3 mm Liver: Normal size. Significant increased parenchymal echogenicity with decreased through transmission of the sound beam. No mass. Portal vein is patent on color Doppler imaging with normal direction of blood flow towards the liver. Other: None. IMPRESSION: 1. No acute findings. 2. Gallbladder sludge but no shadowing stones and no evidence of acute cholecystitis. 3. Significant increased liver parenchymal echogenicity consistent with hepatic  steatosis. Electronically Signed   By: Lajean Manes M.D.   On: 01/31/2021 10:48    Microbiology: Recent Results (from the past 240 hour(s))  Resp Panel by RT-PCR (Flu A&B, Covid) Nasopharyngeal Swab     Status: None   Collection Time: 01/30/21  5:34 PM   Specimen: Nasopharyngeal Swab; Nasopharyngeal(NP) swabs in vial transport medium  Result Value Ref Range Status   SARS Coronavirus 2 by RT PCR NEGATIVE NEGATIVE Final    Comment: (NOTE) SARS-CoV-2 target nucleic acids are NOT DETECTED.  The SARS-CoV-2 RNA is generally detectable in upper respiratory specimens during the acute phase of infection. The lowest concentration of SARS-CoV-2 viral copies this assay can detect is 138 copies/mL. A negative result does not preclude SARS-Cov-2 infection and should not be used as the sole basis for treatment or other patient management decisions. A negative result may occur with  improper specimen collection/handling, submission of specimen other than nasopharyngeal swab, presence of viral mutation(s) within the areas targeted by this assay, and inadequate number of viral copies(<138 copies/mL). A negative result must be combined with clinical observations, patient history, and epidemiological information. The expected result is Negative.  Fact Sheet for Patients:  EntrepreneurPulse.com.au  Fact Sheet for Healthcare Providers:  IncredibleEmployment.be  This test is no t yet approved or cleared by the [place] FDA and  has been authorized for detection and/or diagnosis of SARS-CoV-2 by FDA under an Emergency Use Authorization (EUA). This EUA will remain  in effect (meaning this test can be used) for the duration of the COVID-19 declaration under Section 564(b)(1) of the Act, 21 U.S.C.section 360bbb-3(b)(1), unless the authorization is terminated  or revoked sooner.       Influenza A by PCR NEGATIVE NEGATIVE Final   Influenza B by PCR NEGATIVE  NEGATIVE Final    Comment: (NOTE) The Xpert Xpress SARS-CoV-2/FLU/RSV plus assay is intended as an aid in the diagnosis of influenza from Nasopharyngeal swab specimens and should not be used as a sole basis for treatment. Nasal washings and aspirates are unacceptable for Xpert Xpress SARS-CoV-2/FLU/RSV testing.  Fact Sheet for Patients: EntrepreneurPulse.com.au  Fact Sheet for Healthcare Providers: IncredibleEmployment.be  This test is not yet approved or cleared by the [place] FDA and has been authorized for detection and/or diagnosis of SARS-CoV-2 by FDA under an Emergency Use Authorization (EUA). This EUA will remain in effect (meaning this test can be used) for the duration of the COVID-19 declaration under Section 564(b)(1) of the Act, 21 U.S.C. section 360bbb-3(b)(1), unless the authorization is terminated or revoked.  Performed at [facility], [address], [place], [place] [address]   Urine Culture     Status: Abnormal   Collection Time: 01/30/21  7:40 PM   Specimen: Urine, Clean Catch  Result Value Ref Range Status   Specimen Description   Final    URINE, CLEAN CATCH Performed at [facility], [address] [place], [place] [address]    Special Requests   Final    NONE Performed at [facility], [address], [place], [place] [address]    Culture >=100,000  COLONIES/mL SERRATIA PLYMUTHICA (A)  Final   Report Status 02/02/2021 FINAL  Final   Organism ID, Bacteria SERRATIA PLYMUTHICA (A)  Final      Susceptibility   Serratia plymuthica - MIC*    CEFAZOLIN <=4 SENSITIVE Sensitive     CEFEPIME <=0.12 SENSITIVE Sensitive     CEFTRIAXONE <=0.25 SENSITIVE Sensitive     CIPROFLOXACIN <=0.25 SENSITIVE Sensitive     GENTAMICIN <=1 SENSITIVE Sensitive     IMIPENEM <=0.25 SENSITIVE Sensitive     NITROFURANTOIN <=16 SENSITIVE Sensitive     TRIMETH/SULFA <=20 SENSITIVE Sensitive     PIP/TAZO <=4  SENSITIVE Sensitive     * >=100,000 COLONIES/mL SERRATIA PLYMUTHICA  CULTURE, BLOOD (ROUTINE X 2) w Reflex to ID Panel     Status: None (Preliminary result)   Collection Time: 01/31/21 10:23 AM   Specimen: BLOOD  Result Value Ref Range Status   Specimen Description BLOOD RIGHT [facility]  Final   Special Requests   Final    BOTTLES DRAWN AEROBIC AND ANAEROBIC Blood Culture adequate volume   Culture   Final    NO GROWTH 3 DAYS Performed at [facility], [address] [place], [place] [address]    Report Status PENDING  Incomplete  CULTURE, BLOOD (ROUTINE X 2) w Reflex to ID Panel     Status: None (Preliminary result)   Collection Time: 01/31/21 10:23 AM   Specimen: BLOOD  Result Value Ref Range Status   Specimen Description BLOOD RIGHT HAND  Final   Special Requests   Final    BOTTLES DRAWN AEROBIC AND ANAEROBIC Blood Culture adequate volume   Culture   Final    NO GROWTH 3 DAYS Performed at [facility], [place], [place] [address]    Report Status PENDING  Incomplete  SARS CORONAVIRUS 2 (TAT 6-24 HRS) Nasopharyngeal Nasopharyngeal Swab     Status: None   Collection Time: 02/02/21  1:50 PM   Specimen: Nasopharyngeal Swab  Result Value Ref Range Status   SARS Coronavirus 2 NEGATIVE NEGATIVE Final    Comment: (NOTE) SARS-CoV-2 target nucleic acids are NOT DETECTED.  The SARS-CoV-2 RNA is generally detectable in upper and lower respiratory specimens during the acute phase of infection. Negative results do not preclude SARS-CoV-2 infection, do not rule out co-infections with other pathogens, and should not be used as the sole basis for treatment or other patient management decisions. Negative results must be combined with clinical observations, patient history, and epidemiological information. The expected result is Negative.  Fact Sheet for Patients: SugarRoll.be  Fact Sheet for Healthcare  Providers: https://www.woods-mathews.com/  This test is not yet approved or cleared by the [place] FDA and  has been authorized for detection and/or diagnosis of SARS-CoV-2 by FDA under an Emergency Use Authorization (EUA). This EUA will remain  in effect (meaning this test can be used) for the duration of the COVID-19 declaration under Se ction 564(b)(1) of the Act, 21 U.S.C. section 360bbb-3(b)(1), unless the authorization is terminated or revoked sooner.  Performed at [facility], [place] [address], [place], [place] [address]      Labs: Basic Metabolic Panel: Recent Labs  Lab 01/31/21 0429 01/31/21 1023 01/31/21 2124 02/01/21 0728 02/02/21 0442 02/03/21 0850  NA 138  --  135 136 138 136  K 2.8*  --  3.6 3.7 3.4* 3.9  CL 106  --  105 108 109 107  CO2 24  --  23 21* 23 23  GLUCOSE 92  --  77 62* 98 93  BUN 11  --  10 8 6* 6*  CREATININE 0.68  --  0.73 0.62 0.49* 0.52*  CALCIUM 7.8*  --  7.8* 7.6* 8.0* 8.5*  MG  --  1.6*  --  1.9 1.9 1.7  PHOS  --  3.5  --  1.9* 2.5 2.4*   Liver Function Tests: Recent Labs  Lab 01/31/21 0429 01/31/21 2124 02/01/21 0728 02/02/21 0442 02/03/21 0850  AST 83* 100* 70* 53* 40  ALT 57* 59* 50* 42 36  ALKPHOS 179* 158* 137* 133* 123  BILITOT 1.3* 0.8 0.9 0.8 0.8  PROT 4.9* 5.7* 5.5* 5.3* 5.6*  ALBUMIN 1.9* 2.5* 2.8* 2.6* 2.6*   No results for input(s): LIPASE, AMYLASE in the last 168 hours. Recent Labs  Lab 01/31/21 0429  AMMONIA 15   CBC: Recent Labs  Lab 01/30/21 1054 01/31/21 0429 02/01/21 0728 02/02/21 0442 02/03/21 0850  WBC 10.5 22.0* 17.3* 11.8* 8.4  HGB 13.4 10.3* 9.3* 9.2* 9.7*  HCT 38.3* 30.1* 27.5* 27.0* 28.3*  MCV 101.6* 103.4* 105.0* 101.9* 102.5*  PLT 328 211 198 206 208   Cardiac Enzymes: No results for input(s): CKTOTAL, CKMB, CKMBINDEX, TROPONINI in the last 168 hours. BNP: BNP (last 3 results) No results for input(s): BNP in the last 8760 hours.  ProBNP (last 3 results) No  results for input(s): PROBNP in the last 8760 hours.  CBG: No results for input(s): GLUCAP in the last 168 hours.     Signed:  Alma Friendly, MD [facility] 02/03/2021, 12:34 PM

## 2021-02-03 NOTE — TOC Transition Note (Signed)
Transition of Care [facility]) - CM/SW Discharge Note   Patient Details  Name: JONPAUL LUMM MRN: [id number] Date of Birth: Oct 25, 1950  [facility]) CM/SW Contact:  Chapman Fitch, RN Phone Number: 02/03/2021, 2:29 PM   Clinical Narrative:    Patient will DC GL[facility] Anticipated DC date:02/03/21  Family notified:[facility] IR:JJOAC  Per MD patient ready for DC to . RN, patient, patient's family, and facility notified of DC. Discharge Summary sent to facility. RN given number for report. DC packet on chart. Ambulance transport requested for patient.  TOC signing off.  Bevelyn Ngo [facility] [phone]      Barriers to Discharge: Continued Medical Work up   Patient Goals and CMS Choice        Discharge Placement                       Discharge Plan and Services   Discharge Planning Services: CM Consult                                 Social Determinants of Health (SDOH) Interventions     Readmission Risk Interventions No flowsheet data found.

## 2021-02-03 NOTE — Progress Notes (Signed)
Mobility Specialist - Progress Note   02/03/21 1500  Mobility  Activity Ambulated to bathroom  Level of Assistance Standby assist, set-up cues, supervision of patient - no hands on  Assistive Device None  Distance Ambulated (ft) 20 ft  Mobility Ambulated with assistance in room;Out of bed for toileting  Mobility Response Tolerated well  Mobility performed by Mobility specialist  $Mobility charge 1 Mobility    Pt sitting EOB on arrival, utilizing RA. Ambulated to bathroom with supervision. Further mobility deferred d/t EMS arrival. Pt preparing for d/c.    [name] Mobility Specialist 02/03/21, 3:16 PM

## 2021-02-03 NOTE — Progress Notes (Signed)
Mobility Specialist - Progress Note   02/03/21 1200  Mobility  Activity Ambulated to bathroom;Transferred:  Bed to chair;Transferred:  Chair to bed  Level of Assistance Standby assist, set-up cues, supervision of patient - no hands on  Assistive Device None  Distance Ambulated (ft) 25 ft  Mobility Out of bed for toileting;Ambulated with assistance in room  Mobility Response Tolerated well  Mobility performed by Mobility specialist  $Mobility charge 1 Mobility    Mobility responded to bed alarm. Upon arrival, pt ambulating to bathroom---tray still in place, bed rail still up blocking safe pathway. No AD. Noted soiled sheets, mobility assisted pt to recliner for bed linen to be changed. Pt declined sitting in chair for duration of lunch and requested return to bed once new linen provided. Pt requested to hold off on further activity, but motivated for ambulation after lunch. Pt left supine with alarm set.    Filiberto Pinks Mobility Specialist 02/03/21, 12:40 PM

## 2021-02-04 DIAGNOSIS — E440 Moderate protein-calorie malnutrition: Secondary | ICD-10-CM | POA: Diagnosis not present

## 2021-02-04 DIAGNOSIS — I7143 Infrarenal abdominal aortic aneurysm, without rupture: Secondary | ICD-10-CM | POA: Diagnosis not present

## 2021-02-04 DIAGNOSIS — R296 Repeated falls: Secondary | ICD-10-CM | POA: Diagnosis not present

## 2021-02-04 DIAGNOSIS — I25118 Atherosclerotic heart disease of native coronary artery with other forms of angina pectoris: Secondary | ICD-10-CM | POA: Diagnosis not present

## 2021-02-04 DIAGNOSIS — R410 Disorientation, unspecified: Secondary | ICD-10-CM | POA: Diagnosis not present

## 2021-02-04 DIAGNOSIS — N390 Urinary tract infection, site not specified: Secondary | ICD-10-CM | POA: Diagnosis not present

## 2021-02-05 LAB — CULTURE, BLOOD (ROUTINE X 2)
Culture: NO GROWTH
Culture: NO GROWTH
Special Requests: ADEQUATE
Special Requests: ADEQUATE

## 2021-02-15 DIAGNOSIS — E538 Deficiency of other specified B group vitamins: Secondary | ICD-10-CM | POA: Diagnosis not present

## 2021-02-15 DIAGNOSIS — I10 Essential (primary) hypertension: Secondary | ICD-10-CM | POA: Diagnosis not present

## 2021-02-15 DIAGNOSIS — I25118 Atherosclerotic heart disease of native coronary artery with other forms of angina pectoris: Secondary | ICD-10-CM | POA: Diagnosis not present

## 2021-02-15 DIAGNOSIS — E876 Hypokalemia: Secondary | ICD-10-CM | POA: Diagnosis not present

## 2021-02-15 DIAGNOSIS — E782 Mixed hyperlipidemia: Secondary | ICD-10-CM | POA: Diagnosis not present

## 2021-02-15 DIAGNOSIS — D509 Iron deficiency anemia, unspecified: Secondary | ICD-10-CM | POA: Diagnosis not present

## 2021-02-15 DIAGNOSIS — E440 Moderate protein-calorie malnutrition: Secondary | ICD-10-CM | POA: Diagnosis not present

## 2021-02-15 DIAGNOSIS — E559 Vitamin D deficiency, unspecified: Secondary | ICD-10-CM | POA: Diagnosis not present

## 2021-02-15 DIAGNOSIS — F17210 Nicotine dependence, cigarettes, uncomplicated: Secondary | ICD-10-CM | POA: Diagnosis not present

## 2021-02-15 DIAGNOSIS — N390 Urinary tract infection, site not specified: Secondary | ICD-10-CM | POA: Diagnosis not present

## 2021-02-15 DIAGNOSIS — R296 Repeated falls: Secondary | ICD-10-CM | POA: Diagnosis not present

## 2021-02-15 DIAGNOSIS — I7143 Infrarenal abdominal aortic aneurysm, without rupture: Secondary | ICD-10-CM | POA: Diagnosis not present

## 2021-02-15 DIAGNOSIS — Z9181 History of falling: Secondary | ICD-10-CM | POA: Diagnosis not present

## 2021-02-15 DIAGNOSIS — Z87440 Personal history of urinary (tract) infections: Secondary | ICD-10-CM | POA: Diagnosis not present

## 2021-02-16 DIAGNOSIS — I700 Atherosclerosis of aorta: Secondary | ICD-10-CM | POA: Diagnosis not present

## 2021-02-16 DIAGNOSIS — Z7689 Persons encountering health services in other specified circumstances: Secondary | ICD-10-CM | POA: Diagnosis not present

## 2021-02-16 DIAGNOSIS — D539 Nutritional anemia, unspecified: Secondary | ICD-10-CM | POA: Diagnosis not present

## 2021-02-16 DIAGNOSIS — J439 Emphysema, unspecified: Secondary | ICD-10-CM | POA: Diagnosis not present

## 2021-02-16 DIAGNOSIS — G319 Degenerative disease of nervous system, unspecified: Secondary | ICD-10-CM | POA: Diagnosis not present

## 2021-02-16 DIAGNOSIS — D692 Other nonthrombocytopenic purpura: Secondary | ICD-10-CM | POA: Diagnosis not present

## 2021-02-16 DIAGNOSIS — I7143 Infrarenal abdominal aortic aneurysm, without rupture: Secondary | ICD-10-CM | POA: Diagnosis not present

## 2021-02-16 DIAGNOSIS — R531 Weakness: Secondary | ICD-10-CM | POA: Diagnosis not present

## 2021-02-16 DIAGNOSIS — Z23 Encounter for immunization: Secondary | ICD-10-CM | POA: Diagnosis not present

## 2021-02-16 DIAGNOSIS — E46 Unspecified protein-calorie malnutrition: Secondary | ICD-10-CM | POA: Diagnosis not present

## 2021-02-16 DIAGNOSIS — Z79899 Other long term (current) drug therapy: Secondary | ICD-10-CM | POA: Diagnosis not present

## 2021-02-17 DIAGNOSIS — N390 Urinary tract infection, site not specified: Secondary | ICD-10-CM | POA: Diagnosis not present

## 2021-02-17 DIAGNOSIS — Z87440 Personal history of urinary (tract) infections: Secondary | ICD-10-CM | POA: Diagnosis not present

## 2021-02-17 DIAGNOSIS — I10 Essential (primary) hypertension: Secondary | ICD-10-CM | POA: Diagnosis not present

## 2021-02-17 DIAGNOSIS — I7143 Infrarenal abdominal aortic aneurysm, without rupture: Secondary | ICD-10-CM | POA: Diagnosis not present

## 2021-02-17 DIAGNOSIS — F17210 Nicotine dependence, cigarettes, uncomplicated: Secondary | ICD-10-CM | POA: Diagnosis not present

## 2021-02-17 DIAGNOSIS — R296 Repeated falls: Secondary | ICD-10-CM | POA: Diagnosis not present

## 2021-02-17 DIAGNOSIS — E538 Deficiency of other specified B group vitamins: Secondary | ICD-10-CM | POA: Diagnosis not present

## 2021-02-17 DIAGNOSIS — E876 Hypokalemia: Secondary | ICD-10-CM | POA: Diagnosis not present

## 2021-02-17 DIAGNOSIS — E440 Moderate protein-calorie malnutrition: Secondary | ICD-10-CM | POA: Diagnosis not present

## 2021-02-17 DIAGNOSIS — Z9181 History of falling: Secondary | ICD-10-CM | POA: Diagnosis not present

## 2021-02-17 DIAGNOSIS — D509 Iron deficiency anemia, unspecified: Secondary | ICD-10-CM | POA: Diagnosis not present

## 2021-02-17 DIAGNOSIS — I25118 Atherosclerotic heart disease of native coronary artery with other forms of angina pectoris: Secondary | ICD-10-CM | POA: Diagnosis not present

## 2021-02-17 DIAGNOSIS — E559 Vitamin D deficiency, unspecified: Secondary | ICD-10-CM | POA: Diagnosis not present

## 2021-02-17 DIAGNOSIS — E782 Mixed hyperlipidemia: Secondary | ICD-10-CM | POA: Diagnosis not present

## 2021-02-23 DIAGNOSIS — Z9181 History of falling: Secondary | ICD-10-CM | POA: Diagnosis not present

## 2021-02-23 DIAGNOSIS — D509 Iron deficiency anemia, unspecified: Secondary | ICD-10-CM | POA: Diagnosis not present

## 2021-02-23 DIAGNOSIS — E538 Deficiency of other specified B group vitamins: Secondary | ICD-10-CM | POA: Diagnosis not present

## 2021-02-23 DIAGNOSIS — E782 Mixed hyperlipidemia: Secondary | ICD-10-CM | POA: Diagnosis not present

## 2021-02-23 DIAGNOSIS — E876 Hypokalemia: Secondary | ICD-10-CM | POA: Diagnosis not present

## 2021-02-23 DIAGNOSIS — I7143 Infrarenal abdominal aortic aneurysm, without rupture: Secondary | ICD-10-CM | POA: Diagnosis not present

## 2021-02-23 DIAGNOSIS — R296 Repeated falls: Secondary | ICD-10-CM | POA: Diagnosis not present

## 2021-02-23 DIAGNOSIS — E559 Vitamin D deficiency, unspecified: Secondary | ICD-10-CM | POA: Diagnosis not present

## 2021-02-23 DIAGNOSIS — F17210 Nicotine dependence, cigarettes, uncomplicated: Secondary | ICD-10-CM | POA: Diagnosis not present

## 2021-02-23 DIAGNOSIS — I10 Essential (primary) hypertension: Secondary | ICD-10-CM | POA: Diagnosis not present

## 2021-02-23 DIAGNOSIS — E440 Moderate protein-calorie malnutrition: Secondary | ICD-10-CM | POA: Diagnosis not present

## 2021-02-23 DIAGNOSIS — N390 Urinary tract infection, site not specified: Secondary | ICD-10-CM | POA: Diagnosis not present

## 2021-02-23 DIAGNOSIS — I25118 Atherosclerotic heart disease of native coronary artery with other forms of angina pectoris: Secondary | ICD-10-CM | POA: Diagnosis not present

## 2021-02-23 DIAGNOSIS — Z87440 Personal history of urinary (tract) infections: Secondary | ICD-10-CM | POA: Diagnosis not present

## 2021-02-26 DIAGNOSIS — Z87440 Personal history of urinary (tract) infections: Secondary | ICD-10-CM | POA: Diagnosis not present

## 2021-02-26 DIAGNOSIS — E876 Hypokalemia: Secondary | ICD-10-CM | POA: Diagnosis not present

## 2021-02-26 DIAGNOSIS — E538 Deficiency of other specified B group vitamins: Secondary | ICD-10-CM | POA: Diagnosis not present

## 2021-02-26 DIAGNOSIS — E559 Vitamin D deficiency, unspecified: Secondary | ICD-10-CM | POA: Diagnosis not present

## 2021-02-26 DIAGNOSIS — R296 Repeated falls: Secondary | ICD-10-CM | POA: Diagnosis not present

## 2021-02-26 DIAGNOSIS — E782 Mixed hyperlipidemia: Secondary | ICD-10-CM | POA: Diagnosis not present

## 2021-02-26 DIAGNOSIS — F17210 Nicotine dependence, cigarettes, uncomplicated: Secondary | ICD-10-CM | POA: Diagnosis not present

## 2021-02-26 DIAGNOSIS — D509 Iron deficiency anemia, unspecified: Secondary | ICD-10-CM | POA: Diagnosis not present

## 2021-02-26 DIAGNOSIS — N390 Urinary tract infection, site not specified: Secondary | ICD-10-CM | POA: Diagnosis not present

## 2021-02-26 DIAGNOSIS — Z9181 History of falling: Secondary | ICD-10-CM | POA: Diagnosis not present

## 2021-02-26 DIAGNOSIS — I7143 Infrarenal abdominal aortic aneurysm, without rupture: Secondary | ICD-10-CM | POA: Diagnosis not present

## 2021-02-26 DIAGNOSIS — E440 Moderate protein-calorie malnutrition: Secondary | ICD-10-CM | POA: Diagnosis not present

## 2021-02-26 DIAGNOSIS — I25118 Atherosclerotic heart disease of native coronary artery with other forms of angina pectoris: Secondary | ICD-10-CM | POA: Diagnosis not present

## 2021-02-26 DIAGNOSIS — I10 Essential (primary) hypertension: Secondary | ICD-10-CM | POA: Diagnosis not present

## 2021-03-03 DIAGNOSIS — E559 Vitamin D deficiency, unspecified: Secondary | ICD-10-CM | POA: Diagnosis not present

## 2021-03-03 DIAGNOSIS — R296 Repeated falls: Secondary | ICD-10-CM | POA: Diagnosis not present

## 2021-03-03 DIAGNOSIS — F17210 Nicotine dependence, cigarettes, uncomplicated: Secondary | ICD-10-CM | POA: Diagnosis not present

## 2021-03-03 DIAGNOSIS — Z87440 Personal history of urinary (tract) infections: Secondary | ICD-10-CM | POA: Diagnosis not present

## 2021-03-03 DIAGNOSIS — E876 Hypokalemia: Secondary | ICD-10-CM | POA: Diagnosis not present

## 2021-03-03 DIAGNOSIS — D509 Iron deficiency anemia, unspecified: Secondary | ICD-10-CM | POA: Diagnosis not present

## 2021-03-03 DIAGNOSIS — I25118 Atherosclerotic heart disease of native coronary artery with other forms of angina pectoris: Secondary | ICD-10-CM | POA: Diagnosis not present

## 2021-03-03 DIAGNOSIS — Z9181 History of falling: Secondary | ICD-10-CM | POA: Diagnosis not present

## 2021-03-03 DIAGNOSIS — E782 Mixed hyperlipidemia: Secondary | ICD-10-CM | POA: Diagnosis not present

## 2021-03-03 DIAGNOSIS — I10 Essential (primary) hypertension: Secondary | ICD-10-CM | POA: Diagnosis not present

## 2021-03-03 DIAGNOSIS — I7143 Infrarenal abdominal aortic aneurysm, without rupture: Secondary | ICD-10-CM | POA: Diagnosis not present

## 2021-03-03 DIAGNOSIS — N390 Urinary tract infection, site not specified: Secondary | ICD-10-CM | POA: Diagnosis not present

## 2021-03-03 DIAGNOSIS — E538 Deficiency of other specified B group vitamins: Secondary | ICD-10-CM | POA: Diagnosis not present

## 2021-03-03 DIAGNOSIS — E440 Moderate protein-calorie malnutrition: Secondary | ICD-10-CM | POA: Diagnosis not present

## 2021-03-04 DIAGNOSIS — I25118 Atherosclerotic heart disease of native coronary artery with other forms of angina pectoris: Secondary | ICD-10-CM | POA: Diagnosis not present

## 2021-03-04 DIAGNOSIS — Z9181 History of falling: Secondary | ICD-10-CM | POA: Diagnosis not present

## 2021-03-04 DIAGNOSIS — E876 Hypokalemia: Secondary | ICD-10-CM | POA: Diagnosis not present

## 2021-03-04 DIAGNOSIS — E440 Moderate protein-calorie malnutrition: Secondary | ICD-10-CM | POA: Diagnosis not present

## 2021-03-04 DIAGNOSIS — Z87440 Personal history of urinary (tract) infections: Secondary | ICD-10-CM | POA: Diagnosis not present

## 2021-03-04 DIAGNOSIS — F17210 Nicotine dependence, cigarettes, uncomplicated: Secondary | ICD-10-CM | POA: Diagnosis not present

## 2021-03-04 DIAGNOSIS — E782 Mixed hyperlipidemia: Secondary | ICD-10-CM | POA: Diagnosis not present

## 2021-03-04 DIAGNOSIS — E559 Vitamin D deficiency, unspecified: Secondary | ICD-10-CM | POA: Diagnosis not present

## 2021-03-04 DIAGNOSIS — N390 Urinary tract infection, site not specified: Secondary | ICD-10-CM | POA: Diagnosis not present

## 2021-03-04 DIAGNOSIS — R296 Repeated falls: Secondary | ICD-10-CM | POA: Diagnosis not present

## 2021-03-04 DIAGNOSIS — I10 Essential (primary) hypertension: Secondary | ICD-10-CM | POA: Diagnosis not present

## 2021-03-04 DIAGNOSIS — I7143 Infrarenal abdominal aortic aneurysm, without rupture: Secondary | ICD-10-CM | POA: Diagnosis not present

## 2021-03-04 DIAGNOSIS — E538 Deficiency of other specified B group vitamins: Secondary | ICD-10-CM | POA: Diagnosis not present

## 2021-03-04 DIAGNOSIS — D509 Iron deficiency anemia, unspecified: Secondary | ICD-10-CM | POA: Diagnosis not present

## 2021-03-10 DIAGNOSIS — I25118 Atherosclerotic heart disease of native coronary artery with other forms of angina pectoris: Secondary | ICD-10-CM | POA: Diagnosis not present

## 2021-03-10 DIAGNOSIS — E538 Deficiency of other specified B group vitamins: Secondary | ICD-10-CM | POA: Diagnosis not present

## 2021-03-10 DIAGNOSIS — Z9181 History of falling: Secondary | ICD-10-CM | POA: Diagnosis not present

## 2021-03-10 DIAGNOSIS — I7143 Infrarenal abdominal aortic aneurysm, without rupture: Secondary | ICD-10-CM | POA: Diagnosis not present

## 2021-03-10 DIAGNOSIS — E440 Moderate protein-calorie malnutrition: Secondary | ICD-10-CM | POA: Diagnosis not present

## 2021-03-10 DIAGNOSIS — E782 Mixed hyperlipidemia: Secondary | ICD-10-CM | POA: Diagnosis not present

## 2021-03-10 DIAGNOSIS — I10 Essential (primary) hypertension: Secondary | ICD-10-CM | POA: Diagnosis not present

## 2021-03-10 DIAGNOSIS — E876 Hypokalemia: Secondary | ICD-10-CM | POA: Diagnosis not present

## 2021-03-10 DIAGNOSIS — R296 Repeated falls: Secondary | ICD-10-CM | POA: Diagnosis not present

## 2021-03-10 DIAGNOSIS — Z87440 Personal history of urinary (tract) infections: Secondary | ICD-10-CM | POA: Diagnosis not present

## 2021-03-10 DIAGNOSIS — F17210 Nicotine dependence, cigarettes, uncomplicated: Secondary | ICD-10-CM | POA: Diagnosis not present

## 2021-03-10 DIAGNOSIS — D509 Iron deficiency anemia, unspecified: Secondary | ICD-10-CM | POA: Diagnosis not present

## 2021-03-10 DIAGNOSIS — N390 Urinary tract infection, site not specified: Secondary | ICD-10-CM | POA: Diagnosis not present

## 2021-03-10 DIAGNOSIS — E559 Vitamin D deficiency, unspecified: Secondary | ICD-10-CM | POA: Diagnosis not present

## 2021-03-14 DIAGNOSIS — E782 Mixed hyperlipidemia: Secondary | ICD-10-CM | POA: Diagnosis not present

## 2021-03-14 DIAGNOSIS — E538 Deficiency of other specified B group vitamins: Secondary | ICD-10-CM | POA: Diagnosis not present

## 2021-03-14 DIAGNOSIS — N390 Urinary tract infection, site not specified: Secondary | ICD-10-CM | POA: Diagnosis not present

## 2021-03-14 DIAGNOSIS — R296 Repeated falls: Secondary | ICD-10-CM | POA: Diagnosis not present

## 2021-03-14 DIAGNOSIS — Z87440 Personal history of urinary (tract) infections: Secondary | ICD-10-CM | POA: Diagnosis not present

## 2021-03-14 DIAGNOSIS — I7143 Infrarenal abdominal aortic aneurysm, without rupture: Secondary | ICD-10-CM | POA: Diagnosis not present

## 2021-03-14 DIAGNOSIS — E876 Hypokalemia: Secondary | ICD-10-CM | POA: Diagnosis not present

## 2021-03-14 DIAGNOSIS — Z9181 History of falling: Secondary | ICD-10-CM | POA: Diagnosis not present

## 2021-03-14 DIAGNOSIS — I10 Essential (primary) hypertension: Secondary | ICD-10-CM | POA: Diagnosis not present

## 2021-03-14 DIAGNOSIS — E559 Vitamin D deficiency, unspecified: Secondary | ICD-10-CM | POA: Diagnosis not present

## 2021-03-14 DIAGNOSIS — D509 Iron deficiency anemia, unspecified: Secondary | ICD-10-CM | POA: Diagnosis not present

## 2021-03-14 DIAGNOSIS — E440 Moderate protein-calorie malnutrition: Secondary | ICD-10-CM | POA: Diagnosis not present

## 2021-03-14 DIAGNOSIS — I25118 Atherosclerotic heart disease of native coronary artery with other forms of angina pectoris: Secondary | ICD-10-CM | POA: Diagnosis not present

## 2021-03-14 DIAGNOSIS — F17210 Nicotine dependence, cigarettes, uncomplicated: Secondary | ICD-10-CM | POA: Diagnosis not present

## 2021-04-02 DIAGNOSIS — Z9181 History of falling: Secondary | ICD-10-CM | POA: Diagnosis not present

## 2021-04-02 DIAGNOSIS — R296 Repeated falls: Secondary | ICD-10-CM | POA: Diagnosis not present

## 2021-04-02 DIAGNOSIS — Z87440 Personal history of urinary (tract) infections: Secondary | ICD-10-CM | POA: Diagnosis not present

## 2021-04-02 DIAGNOSIS — I10 Essential (primary) hypertension: Secondary | ICD-10-CM | POA: Diagnosis not present

## 2021-04-02 DIAGNOSIS — I25118 Atherosclerotic heart disease of native coronary artery with other forms of angina pectoris: Secondary | ICD-10-CM | POA: Diagnosis not present

## 2021-04-02 DIAGNOSIS — F17210 Nicotine dependence, cigarettes, uncomplicated: Secondary | ICD-10-CM | POA: Diagnosis not present

## 2021-04-02 DIAGNOSIS — E876 Hypokalemia: Secondary | ICD-10-CM | POA: Diagnosis not present

## 2021-04-02 DIAGNOSIS — E782 Mixed hyperlipidemia: Secondary | ICD-10-CM | POA: Diagnosis not present

## 2021-04-02 DIAGNOSIS — N390 Urinary tract infection, site not specified: Secondary | ICD-10-CM | POA: Diagnosis not present

## 2021-04-02 DIAGNOSIS — E440 Moderate protein-calorie malnutrition: Secondary | ICD-10-CM | POA: Diagnosis not present

## 2021-04-02 DIAGNOSIS — E559 Vitamin D deficiency, unspecified: Secondary | ICD-10-CM | POA: Diagnosis not present

## 2021-04-02 DIAGNOSIS — D509 Iron deficiency anemia, unspecified: Secondary | ICD-10-CM | POA: Diagnosis not present

## 2021-04-02 DIAGNOSIS — E538 Deficiency of other specified B group vitamins: Secondary | ICD-10-CM | POA: Diagnosis not present

## 2021-04-02 DIAGNOSIS — I7143 Infrarenal abdominal aortic aneurysm, without rupture: Secondary | ICD-10-CM | POA: Diagnosis not present

## 2021-04-14 DIAGNOSIS — E559 Vitamin D deficiency, unspecified: Secondary | ICD-10-CM | POA: Diagnosis not present

## 2021-04-14 DIAGNOSIS — D539 Nutritional anemia, unspecified: Secondary | ICD-10-CM | POA: Diagnosis not present

## 2021-04-14 DIAGNOSIS — E46 Unspecified protein-calorie malnutrition: Secondary | ICD-10-CM | POA: Diagnosis not present

## 2021-04-14 DIAGNOSIS — I7143 Infrarenal abdominal aortic aneurysm, without rupture: Secondary | ICD-10-CM | POA: Diagnosis not present

## 2021-04-15 DIAGNOSIS — E876 Hypokalemia: Secondary | ICD-10-CM | POA: Diagnosis not present

## 2021-04-15 DIAGNOSIS — F17210 Nicotine dependence, cigarettes, uncomplicated: Secondary | ICD-10-CM | POA: Diagnosis not present

## 2021-04-15 DIAGNOSIS — N390 Urinary tract infection, site not specified: Secondary | ICD-10-CM | POA: Diagnosis not present

## 2021-04-15 DIAGNOSIS — I10 Essential (primary) hypertension: Secondary | ICD-10-CM | POA: Diagnosis not present

## 2021-04-15 DIAGNOSIS — E782 Mixed hyperlipidemia: Secondary | ICD-10-CM | POA: Diagnosis not present

## 2021-04-15 DIAGNOSIS — E538 Deficiency of other specified B group vitamins: Secondary | ICD-10-CM | POA: Diagnosis not present

## 2021-04-15 DIAGNOSIS — Z87440 Personal history of urinary (tract) infections: Secondary | ICD-10-CM | POA: Diagnosis not present

## 2021-04-15 DIAGNOSIS — D509 Iron deficiency anemia, unspecified: Secondary | ICD-10-CM | POA: Diagnosis not present

## 2021-04-15 DIAGNOSIS — R296 Repeated falls: Secondary | ICD-10-CM | POA: Diagnosis not present

## 2021-04-15 DIAGNOSIS — I25118 Atherosclerotic heart disease of native coronary artery with other forms of angina pectoris: Secondary | ICD-10-CM | POA: Diagnosis not present

## 2021-04-15 DIAGNOSIS — E440 Moderate protein-calorie malnutrition: Secondary | ICD-10-CM | POA: Diagnosis not present

## 2021-04-15 DIAGNOSIS — Z9181 History of falling: Secondary | ICD-10-CM | POA: Diagnosis not present

## 2021-04-15 DIAGNOSIS — I7143 Infrarenal abdominal aortic aneurysm, without rupture: Secondary | ICD-10-CM | POA: Diagnosis not present

## 2021-04-15 DIAGNOSIS — E559 Vitamin D deficiency, unspecified: Secondary | ICD-10-CM | POA: Diagnosis not present

## 2021-04-29 ENCOUNTER — Encounter: Payer: Self-pay | Admitting: Physician Assistant

## 2021-04-29 DIAGNOSIS — I700 Atherosclerosis of aorta: Secondary | ICD-10-CM | POA: Insufficient documentation

## 2021-04-29 DIAGNOSIS — I714 Abdominal aortic aneurysm, without rupture, unspecified: Secondary | ICD-10-CM | POA: Insufficient documentation

## 2021-04-29 HISTORY — DX: Abdominal aortic aneurysm, without rupture, unspecified: I71.40

## 2021-04-29 NOTE — Progress Notes (Deleted)
?Cardiology Office Note:   ? ?Date:  04/29/2021  ? ?ID:  [name], DOB 1950/07/23, MRN [id number] ? ?PCP:  Ailene Ravel, MD  ?[facility] Providers ?Cardiologist:  Dietrich Pates, MD { ?Click to update primary MD,subspecialty MD or APP then REFRESH:1}  *** ?Referring MD: Ailene Ravel, MD  ? ?Chief Complaint:  No chief complaint on file. ?{Click here for Visit Info    :1}  ? ?Patient Profile: ?Coronary artery disease ?S/p Taxus DES x2 to the RCA in 01/2004 ?S/p Cypher DES to the RCA 2/2 ISR, Cutting Balloon POBA to LAD in 03/2004 ?Carotid artery disease ?S/p L CEA in 01/2004 ?S/p R CEA in 02/2005 ?Hyperlipidemia ?Hypertension ?Abdominal aortic aneurysm  ?CT 01/2021: 4.4 cm infrarenal AAA ?Aortic atherosclerosis  ? ?Prior CV Studies: ?Echocardiogram 01/31/2021 ?EF 60-65, no RWMA, GRII DD, normal RVSF ? ?Cardiac catheterization 04/07/2004 ?RCA stent with 90% restenosis at the proximal edge ?LAD 40-50 proximal and 40 mid; D1 ostial 90 ?LCx patent ?EF 60 ?PCI: 3 x 18 mm Cypher DES to the RCA ?PCI: Cutting Balloon POBA to the D1 ?{Select studies to display:26339}  ? ?History of Present Illness:   ?[name] is a 71 y.o. male with the above problem list.  He was last seen in the clinic by Jacolyn Reedy, PA-C in June 2022.   ? ?He was admitted to [facility] in Jan 2023 with urosepsis.  Echocardiogram demonstrated normal EF.  He did have an abd CT with incidental finding of 4.4 cm infrarenal abdominal aortic aneurysm. ? ?He returns for follow-up.***     ?   ?Past Medical History:  ?Diagnosis Date  ? Cerebrovascular disease   ? Coronary artery disease   ? Dyslipidemia   ? Hypertension   ? Pain in joint, shoulder region   ? ?Current Medications: ?No outpatient medications have been marked as taking for the 04/30/21 encounter (Appointment) with Beatrice Lecher, PA-C.  ?  ?Allergies:   Patient has no known allergies.  ? ?Social History  ? ?Tobacco Use  ? Smoking status: Every Day  ?  Types: E-cigarettes  ? Smokeless tobacco:  Never  ? Tobacco comments:  ?  Smokes electronic ciggarettes  ?Substance Use Topics  ? Alcohol use: Yes  ?  Alcohol/week: 21.0 standard drinks  ?  Types: 21 Shots of liquor per week  ? Drug use: Never  ?  ?Family Hx: ?The patient's family history includes Alzheimer's disease in his mother; Heart disease in his father. ? ?ROS  ? ?EKGs/Labs/Other Test Reviewed:   ? ?EKG:  EKG is *** ordered today.  The ekg ordered today demonstrates *** ? ?Recent Labs: ?01/31/2021: TSH 0.988 ?02/03/2021: ALT 36; BUN 6; Creatinine, Ser 0.52; Hemoglobin 9.7; Magnesium 1.7; Platelets 208; Potassium 3.9; Sodium 136  ? ?Recent Lipid Panel ?No results for input(s): CHOL, TRIG, HDL, VLDL, LDLCALC, LDLDIRECT in the last 8760 hours.  ? ?Risk Assessment/Calculations:   ?{Does this patient have ATRIAL FIBRILLATION?[phone]} ?    ?Physical Exam:   ? ?VS:  There were no vitals taken for this visit.   ? ?Wt Readings from Last 3 Encounters:  ?02/01/21 119 lb 0.8 oz (54 kg)  ?07/14/20 135 lb 6.4 oz (61.4 kg)  ?03/25/19 155 lb 12.8 oz (70.7 kg)  ?  ?Physical Exam *** ?    ?ASSESSMENT & PLAN:   ?No problem-specific Assessment & Plan notes found for this encounter. ?  ?     ?{Are you ordering a CV Procedure (e.g.  stress test, cath, DCCV, TEE, etc)?   Press F2        :[id number]}  ?Dispo:  No follow-ups on file.  ? ?Medication Adjustments/Labs and Tests Ordered: ?Current medicines are reviewed at length with the patient today.  Concerns regarding medicines are outlined above.  ?Tests Ordered: ?No orders of the defined types were placed in this encounter. ? ?Medication Changes: ?No orders of the defined types were placed in this encounter. ? ?Signed, ?Tereso Newcomer, PA-C  ?04/29/2021 4:42 PM    ?[facility] ?[address] [place], [place], [place]  [address] ?Phone: [phone]; Fax: [phone]  ?

## 2021-04-30 ENCOUNTER — Ambulatory Visit: Payer: Medicare Other | Admitting: Physician Assistant

## 2021-04-30 DIAGNOSIS — I6523 Occlusion and stenosis of bilateral carotid arteries: Secondary | ICD-10-CM

## 2021-04-30 DIAGNOSIS — I2510 Atherosclerotic heart disease of native coronary artery without angina pectoris: Secondary | ICD-10-CM

## 2021-04-30 DIAGNOSIS — I10 Essential (primary) hypertension: Secondary | ICD-10-CM

## 2021-04-30 DIAGNOSIS — I700 Atherosclerosis of aorta: Secondary | ICD-10-CM

## 2021-04-30 DIAGNOSIS — I7143 Infrarenal abdominal aortic aneurysm, without rupture: Secondary | ICD-10-CM

## 2021-04-30 DIAGNOSIS — E785 Hyperlipidemia, unspecified: Secondary | ICD-10-CM

## 2021-05-20 ENCOUNTER — Emergency Department: Payer: Medicare Other

## 2021-05-20 ENCOUNTER — Inpatient Hospital Stay
Admission: EM | Admit: 2021-05-20 | Discharge: 2021-05-27 | DRG: 082 | Disposition: A | Discharge status: Hospice | Payer: Medicare Other | Attending: Internal Medicine | Admitting: Internal Medicine

## 2021-05-20 ENCOUNTER — Other Ambulatory Visit: Payer: Self-pay

## 2021-05-20 DIAGNOSIS — J320 Chronic maxillary sinusitis: Secondary | ICD-10-CM | POA: Diagnosis present

## 2021-05-20 DIAGNOSIS — S065XAA Traumatic subdural hemorrhage with loss of consciousness status unknown, initial encounter: Secondary | ICD-10-CM | POA: Diagnosis not present

## 2021-05-20 DIAGNOSIS — R1310 Dysphagia, unspecified: Secondary | ICD-10-CM | POA: Diagnosis present

## 2021-05-20 DIAGNOSIS — I6200 Nontraumatic subdural hemorrhage, unspecified: Secondary | ICD-10-CM

## 2021-05-20 DIAGNOSIS — S0231XA Fracture of orbital floor, right side, initial encounter for closed fracture: Secondary | ICD-10-CM | POA: Diagnosis present

## 2021-05-20 DIAGNOSIS — I6340 Cerebral infarction due to embolism of unspecified cerebral artery: Secondary | ICD-10-CM | POA: Diagnosis present

## 2021-05-20 DIAGNOSIS — F1010 Alcohol abuse, uncomplicated: Secondary | ICD-10-CM | POA: Diagnosis present

## 2021-05-20 DIAGNOSIS — S0636AA Traumatic hemorrhage of cerebrum, unspecified, with loss of consciousness status unknown, initial encounter: Secondary | ICD-10-CM | POA: Diagnosis not present

## 2021-05-20 DIAGNOSIS — I951 Orthostatic hypotension: Secondary | ICD-10-CM | POA: Diagnosis not present

## 2021-05-20 DIAGNOSIS — Y92019 Unspecified place in single-family (private) house as the place of occurrence of the external cause: Secondary | ICD-10-CM | POA: Diagnosis not present

## 2021-05-20 DIAGNOSIS — I471 Supraventricular tachycardia: Secondary | ICD-10-CM | POA: Diagnosis not present

## 2021-05-20 DIAGNOSIS — Z66 Do not resuscitate: Secondary | ICD-10-CM | POA: Diagnosis not present

## 2021-05-20 DIAGNOSIS — Z820 Family history of epilepsy and other diseases of the nervous system: Secondary | ICD-10-CM

## 2021-05-20 DIAGNOSIS — R64 Cachexia: Secondary | ICD-10-CM | POA: Diagnosis not present

## 2021-05-20 DIAGNOSIS — I7143 Infrarenal abdominal aortic aneurysm, without rupture: Secondary | ICD-10-CM | POA: Diagnosis not present

## 2021-05-20 DIAGNOSIS — J439 Emphysema, unspecified: Secondary | ICD-10-CM | POA: Diagnosis not present

## 2021-05-20 DIAGNOSIS — I6389 Other cerebral infarction: Secondary | ICD-10-CM | POA: Diagnosis not present

## 2021-05-20 DIAGNOSIS — R4189 Other symptoms and signs involving cognitive functions and awareness: Secondary | ICD-10-CM | POA: Diagnosis not present

## 2021-05-20 DIAGNOSIS — G9341 Metabolic encephalopathy: Secondary | ICD-10-CM | POA: Diagnosis not present

## 2021-05-20 DIAGNOSIS — Z8673 Personal history of transient ischemic attack (TIA), and cerebral infarction without residual deficits: Secondary | ICD-10-CM

## 2021-05-20 DIAGNOSIS — Z9181 History of falling: Secondary | ICD-10-CM

## 2021-05-20 DIAGNOSIS — I2510 Atherosclerotic heart disease of native coronary artery without angina pectoris: Secondary | ICD-10-CM | POA: Diagnosis not present

## 2021-05-20 DIAGNOSIS — Z7189 Other specified counseling: Secondary | ICD-10-CM | POA: Diagnosis not present

## 2021-05-20 DIAGNOSIS — W19XXXA Unspecified fall, initial encounter: Secondary | ICD-10-CM | POA: Diagnosis not present

## 2021-05-20 DIAGNOSIS — S0230XA Fracture of orbital floor, unspecified side, initial encounter for closed fracture: Secondary | ICD-10-CM

## 2021-05-20 DIAGNOSIS — S066X0A Traumatic subarachnoid hemorrhage without loss of consciousness, initial encounter: Secondary | ICD-10-CM | POA: Diagnosis not present

## 2021-05-20 DIAGNOSIS — I619 Nontraumatic intracerebral hemorrhage, unspecified: Secondary | ICD-10-CM | POA: Diagnosis present

## 2021-05-20 DIAGNOSIS — Z8249 Family history of ischemic heart disease and other diseases of the circulatory system: Secondary | ICD-10-CM

## 2021-05-20 DIAGNOSIS — Z515 Encounter for palliative care: Secondary | ICD-10-CM

## 2021-05-20 DIAGNOSIS — Y92009 Unspecified place in unspecified non-institutional (private) residence as the place of occurrence of the external cause: Secondary | ICD-10-CM

## 2021-05-20 DIAGNOSIS — S066XAA Traumatic subarachnoid hemorrhage with loss of consciousness status unknown, initial encounter: Secondary | ICD-10-CM | POA: Diagnosis not present

## 2021-05-20 DIAGNOSIS — R627 Adult failure to thrive: Secondary | ICD-10-CM | POA: Diagnosis present

## 2021-05-20 DIAGNOSIS — I672 Cerebral atherosclerosis: Secondary | ICD-10-CM | POA: Diagnosis not present

## 2021-05-20 DIAGNOSIS — R296 Repeated falls: Secondary | ICD-10-CM | POA: Diagnosis present

## 2021-05-20 DIAGNOSIS — R29736 NIHSS score 36: Secondary | ICD-10-CM | POA: Diagnosis present

## 2021-05-20 DIAGNOSIS — H1132 Conjunctival hemorrhage, left eye: Secondary | ICD-10-CM | POA: Diagnosis not present

## 2021-05-20 DIAGNOSIS — I10 Essential (primary) hypertension: Secondary | ICD-10-CM | POA: Diagnosis not present

## 2021-05-20 DIAGNOSIS — I629 Nontraumatic intracranial hemorrhage, unspecified: Secondary | ICD-10-CM | POA: Diagnosis not present

## 2021-05-20 DIAGNOSIS — I6310 Cerebral infarction due to embolism of unspecified precerebral artery: Secondary | ICD-10-CM | POA: Diagnosis not present

## 2021-05-20 DIAGNOSIS — Z7982 Long term (current) use of aspirin: Secondary | ICD-10-CM | POA: Diagnosis not present

## 2021-05-20 DIAGNOSIS — S0003XA Contusion of scalp, initial encounter: Secondary | ICD-10-CM

## 2021-05-20 DIAGNOSIS — J323 Chronic sphenoidal sinusitis: Secondary | ICD-10-CM | POA: Diagnosis present

## 2021-05-20 DIAGNOSIS — Z743 Need for continuous supervision: Secondary | ICD-10-CM | POA: Diagnosis not present

## 2021-05-20 DIAGNOSIS — I639 Cerebral infarction, unspecified: Secondary | ICD-10-CM | POA: Diagnosis present

## 2021-05-20 DIAGNOSIS — Z681 Body mass index (BMI) 19 or less, adult: Secondary | ICD-10-CM | POA: Diagnosis not present

## 2021-05-20 DIAGNOSIS — Z955 Presence of coronary angioplasty implant and graft: Secondary | ICD-10-CM

## 2021-05-20 DIAGNOSIS — G936 Cerebral edema: Secondary | ICD-10-CM | POA: Diagnosis not present

## 2021-05-20 DIAGNOSIS — Z20822 Contact with and (suspected) exposure to covid-19: Secondary | ICD-10-CM | POA: Diagnosis present

## 2021-05-20 DIAGNOSIS — E876 Hypokalemia: Secondary | ICD-10-CM | POA: Diagnosis present

## 2021-05-20 DIAGNOSIS — E440 Moderate protein-calorie malnutrition: Secondary | ICD-10-CM | POA: Insufficient documentation

## 2021-05-20 DIAGNOSIS — Z79899 Other long term (current) drug therapy: Secondary | ICD-10-CM

## 2021-05-20 DIAGNOSIS — F17290 Nicotine dependence, other tobacco product, uncomplicated: Secondary | ICD-10-CM | POA: Diagnosis present

## 2021-05-20 DIAGNOSIS — E785 Hyperlipidemia, unspecified: Secondary | ICD-10-CM | POA: Diagnosis not present

## 2021-05-20 DIAGNOSIS — I499 Cardiac arrhythmia, unspecified: Secondary | ICD-10-CM | POA: Diagnosis not present

## 2021-05-20 DIAGNOSIS — R000 Tachycardia, unspecified: Secondary | ICD-10-CM | POA: Diagnosis not present

## 2021-05-20 DIAGNOSIS — S065X0A Traumatic subdural hemorrhage without loss of consciousness, initial encounter: Secondary | ICD-10-CM | POA: Diagnosis not present

## 2021-05-20 DIAGNOSIS — R410 Disorientation, unspecified: Secondary | ICD-10-CM | POA: Diagnosis not present

## 2021-05-20 DIAGNOSIS — S0512XA Contusion of eyeball and orbital tissues, left eye, initial encounter: Secondary | ICD-10-CM | POA: Diagnosis not present

## 2021-05-20 DIAGNOSIS — R54 Age-related physical debility: Secondary | ICD-10-CM | POA: Diagnosis present

## 2021-05-20 DIAGNOSIS — R4182 Altered mental status, unspecified: Secondary | ICD-10-CM | POA: Diagnosis not present

## 2021-05-20 DIAGNOSIS — R531 Weakness: Secondary | ICD-10-CM | POA: Diagnosis not present

## 2021-05-20 DIAGNOSIS — S199XXA Unspecified injury of neck, initial encounter: Secondary | ICD-10-CM | POA: Diagnosis not present

## 2021-05-20 DIAGNOSIS — J329 Chronic sinusitis, unspecified: Secondary | ICD-10-CM | POA: Diagnosis present

## 2021-05-20 DIAGNOSIS — I714 Abdominal aortic aneurysm, without rupture, unspecified: Secondary | ICD-10-CM | POA: Diagnosis present

## 2021-05-20 DIAGNOSIS — K8020 Calculus of gallbladder without cholecystitis without obstruction: Secondary | ICD-10-CM | POA: Diagnosis not present

## 2021-05-20 DIAGNOSIS — S06360A Traumatic hemorrhage of cerebrum, unspecified, without loss of consciousness, initial encounter: Secondary | ICD-10-CM | POA: Diagnosis not present

## 2021-05-20 LAB — COMPREHENSIVE METABOLIC PANEL
ALT: 32 U/L (ref 0–44)
AST: 50 U/L — ABNORMAL HIGH (ref 15–41)
Albumin: 4.2 g/dL (ref 3.5–5.0)
Alkaline Phosphatase: 92 U/L (ref 38–126)
Anion gap: 11 (ref 5–15)
BUN: 16 mg/dL (ref 8–23)
CO2: 28 mmol/L (ref 22–32)
Calcium: 9.8 mg/dL (ref 8.9–10.3)
Chloride: 98 mmol/L (ref 98–111)
Creatinine, Ser: 0.58 mg/dL — ABNORMAL LOW (ref 0.61–1.24)
GFR, Estimated: >60 mL/min (ref >60)
Glucose, Bld: 154 mg/dL — ABNORMAL HIGH (ref 70–99)
Potassium: 3.2 mmol/L — ABNORMAL LOW (ref 3.5–5.1)
Sodium: 137 mmol/L (ref 135–145)
Total Bilirubin: 0.8 mg/dL (ref 0.3–1.2)
Total Protein: 8.4 g/dL — ABNORMAL HIGH (ref 6.5–8.1)

## 2021-05-20 LAB — RESP PANEL BY RT-PCR (FLU A&B, COVID) ARPGX2
Influenza A by PCR: NEGATIVE
Influenza B by PCR: NEGATIVE
SARS Coronavirus 2 by RT PCR: NEGATIVE

## 2021-05-20 LAB — CBC WITH DIFFERENTIAL/PLATELET
Abs Immature Granulocytes: 0.06 10*3/uL (ref 0.00–0.07)
Basophils Absolute: 0 10*3/uL (ref 0.0–0.1)
Basophils Relative: 0 %
Eosinophils Absolute: 0 10*3/uL (ref 0.0–0.5)
Eosinophils Relative: 0 %
HCT: 37.5 % — ABNORMAL LOW (ref 39.0–52.0)
Hemoglobin: 12.6 g/dL — ABNORMAL LOW (ref 13.0–17.0)
Immature Granulocytes: 0 %
Lymphocytes Relative: 12 %
Lymphs Abs: 1.6 10*3/uL (ref 0.7–4.0)
MCH: 32.8 pg (ref 26.0–34.0)
MCHC: 33.6 g/dL (ref 30.0–36.0)
MCV: 97.7 fL (ref 80.0–100.0)
Monocytes Absolute: 1.2 10*3/uL — ABNORMAL HIGH (ref 0.1–1.0)
Monocytes Relative: 9 %
Neutro Abs: 10.5 10*3/uL — ABNORMAL HIGH (ref 1.7–7.7)
Neutrophils Relative %: 79 %
Platelets: 203 10*3/uL (ref 150–400)
RBC: 3.84 MIL/uL — ABNORMAL LOW (ref 4.22–5.81)
RDW: 13.2 % (ref 11.5–15.5)
WBC: 13.5 10*3/uL — ABNORMAL HIGH (ref 4.0–10.5)
nRBC: 0 % (ref 0.0–0.2)

## 2021-05-20 LAB — CK: Total CK: 94 U/L (ref 49–397)

## 2021-05-20 LAB — URINE DRUG SCREEN, QUALITATIVE (ARMC ONLY)
Amphetamines, Ur Screen: NOT DETECTED
Barbiturates, Ur Screen: NOT DETECTED
Benzodiazepine, Ur Scrn: NOT DETECTED
Cannabinoid 50 Ng, Ur ~~LOC~~: NOT DETECTED
Cocaine Metabolite,Ur ~~LOC~~: NOT DETECTED
MDMA (Ecstasy)Ur Screen: NOT DETECTED
Methadone Scn, Ur: NOT DETECTED
Opiate, Ur Screen: NOT DETECTED
Phencyclidine (PCP) Ur S: NOT DETECTED
Tricyclic, Ur Screen: NOT DETECTED

## 2021-05-20 LAB — TROPONIN I (HIGH SENSITIVITY): Troponin I (High Sensitivity): 10 ng/L (ref <18)

## 2021-05-20 LAB — URINALYSIS, ROUTINE W REFLEX MICROSCOPIC
Bilirubin Urine: NEGATIVE
Glucose, UA: NEGATIVE mg/dL
Hgb urine dipstick: NEGATIVE
Ketones, ur: 5 mg/dL — AB
Leukocytes,Ua: NEGATIVE
Nitrite: NEGATIVE
Protein, ur: NEGATIVE mg/dL
Specific Gravity, Urine: >1.046 — ABNORMAL HIGH (ref 1.005–1.030)
pH: 6 (ref 5.0–8.0)

## 2021-05-20 LAB — ETHANOL: Alcohol, Ethyl (B): <10 mg/dL (ref <10)

## 2021-05-20 LAB — LACTIC ACID, PLASMA: Lactic Acid, Venous: 2.7 mmol/L (ref 0.5–1.9)

## 2021-05-20 MED ORDER — IOHEXOL 350 MG/ML SOLN
75.0000 mL | Freq: Once | INTRAVENOUS | Status: AC | PRN
  Administered 2021-05-20: 75 mL via INTRAVENOUS

## 2021-05-20 MED ORDER — LEVETIRACETAM IN NACL 1500 MG/100ML IV SOLN
1500.0000 mg | Freq: Once | INTRAVENOUS | Status: AC
  Administered 2021-05-20: 1500 mg via INTRAVENOUS
  Filled 2021-05-20: qty 100

## 2021-05-20 MED ORDER — LEVETIRACETAM IN NACL 500 MG/100ML IV SOLN
500.0000 mg | Freq: Two times a day (BID) | INTRAVENOUS | Status: DC
  Administered 2021-05-21 – 2021-05-23 (×5): 500 mg via INTRAVENOUS
  Filled 2021-05-20 (×7): qty 100

## 2021-05-20 MED ORDER — SODIUM CHLORIDE 0.9 % IV SOLN
20.0000 ug | Freq: Once | INTRAVENOUS | Status: AC
  Administered 2021-05-20: 20 ug via INTRAVENOUS
  Filled 2021-05-20: qty 5

## 2021-05-20 MED ORDER — SODIUM CHLORIDE 0.9 % IV BOLUS
1000.0000 mL | Freq: Once | INTRAVENOUS | Status: AC
  Administered 2021-05-20: 1000 mL via INTRAVENOUS

## 2021-05-20 NOTE — Assessment & Plan Note (Signed)
Blood pressure currently controlled.

## 2021-05-20 NOTE — ED Notes (Addendum)
Patient continually pulling leads and pulse ox off. Attempting to reorient patient without success. Attempted mittens to keep patient from pulling lines. Pt pulled mittens off. Unable to calm patient with meds because of AMS and continuing neuro checks.  ?

## 2021-05-20 NOTE — Assessment & Plan Note (Signed)
Pain control.  Can consider ice ?

## 2021-05-20 NOTE — Assessment & Plan Note (Addendum)
?  Consider ophthalmology consult in the a.m. ?

## 2021-05-20 NOTE — H&P (Signed)
?History and Physical  ? ? ?Patient: [name] ZOX:[id number] DOB: 1950/10/29 ?DOA: 05/20/2021 ?DOS: the patient was seen and examined on 05/20/2021 ?PCP: Ailene Ravel, MD  ?Patient coming from: Home ? ?Chief Complaint:  ?Chief Complaint  ?Patient presents with  ? Fall  ? Altered Mental Status  ? ? ?HPI: [name] is a 71 y.o. male with medical history significant for HTN, HLD, CAD, CAD, who lives alone, hospitalized 2 months ago from 1/7 to 02/03/2021 with severe sepsis secondary to UTI presenting with a fall, found to have severe malnutrition and likely cognitive impairment who was brought to the ED today after was found  in the home with altered mental status, covered with urine and feces by family members.  Last known normal was several days prior.  There was some noted blood on the floor and patient had some bruising around his left eye. ?ED course: On arrival, tachycardic to 114 and slightly hypothermic at 97.4.  Labs with leukocytosis of 13,000 and hemoglobin 12.6.  Normal CK.  Potassium 3.2, creatinine 0.58, AST/ALT 50/32, EtOH less than 10, troponin 10, COVID and flu negative.  EKG, personally viewed and interpreted: Sinus tachycardia at 116 with nonspecific ST-T wave changes. ?CT head: ?IMPRESSION: ?1. Multi compartment acute intracranial hemorrhage. ?2. Multiple foci of intraparenchymal hemorrhage, largest in the ?right temporal lobe measuring 2.1 x 2.1 x 2.9 cm. There also ?bifrontal hemorrhages. ?3. Diffuse subarachnoid hemorrhage. Intraventricular hemorrhage ?within the left lateral ventricle and fourth ventricle, lesser ?within basilar cisterns. ?4. Subdural hemorrhage along the falx and right frontal and temporal ?lobe. ?5. Intraventricular blood within the left lateral ventricle fourth ?ventricle and to a lesser extent basilar cisterns. ?6. Extra-axial collection overlying the left frontal convexity ?measuring 4 mm is heterogeneous in density, possible chronic ?subdural hematoma or  hygroma although new from January. ?7. Mild effacement of sulci on the right.  No midline shift. ? ?CT  maxillofacial;  ?IMPRESSION: ?1. Nondisplaced right inferior orbital wall fracture, age ?indeterminate. There is right maxillary hemosinus suggesting this ?may be acute. ?2. Left frontal scalp hematoma. Left infraorbital soft tissue edema. ?3. Chronic sinusitis with opacification of left side of sphenoid ?sinus, many ethmoid air cells, and majority of right maxillary ?sinus. Chronic cortical thickening, sclerosis and hypertrophy of the ?left maxillary and left side of sphenoid sinus. ?4. Multiple missing teeth, poor dentition. ? ?CTA head showed no intracranial large vessel occlusion or significant stenosis.  No evidence of vascular malformation or aneurysm. ? ?Neurosurgery was consulted from the ED: Keppra 500 was recommended.  Given no vascular abnormality on CTA recommendation for neurology consult.  The ED provider subsequently spoke with neurologist, Dr. Selina Cooley who recommended keeping systolic blood pressure under 811. ?Hospitalist consulted for admission.  ?Review of Systems  ?Unable to perform ROS: Mental acuity  ? ?Past Medical History:  ?Diagnosis Date  ? AAA (abdominal aortic aneurysm) without rupture (HCC) 04/29/2021  ? CT in Jan 2023: 4.4. infrarenal abdominal aortic aneurysm   ? Cerebrovascular disease   ? Coronary artery disease   ? Dyslipidemia   ? Hypertension   ? Pain in joint, shoulder region   ? ?Past Surgical History:  ?Procedure Laterality Date  ? CAROTID ENDARTERECTOMY    ? left/Jan. 2006/ right;Dacron patch angioplasty  ? PTCA    ? stent placement January 2006  ? ?Social History:  reports that he has been smoking e-cigarettes. He has never used smokeless tobacco. He reports current alcohol use of about 21.0 standard drinks per  week. He reports that he does not use drugs. ? ?No Known Allergies ? ?Family History  ?Problem Relation Age of Onset  ? Alzheimer's disease Mother   ? Heart disease  Father   ? ? ?Prior to Admission medications   ?Medication Sig Start Date End Date Taking? Authorizing Provider  ?amLODipine (NORVASC) 5 MG tablet TAKE 1 TABLET (5 MG TOTAL) BY MOUTH DAILY. 08/03/20   Pricilla Riffle, MD  ?ascorbic acid (VITAMIN C) 500 MG tablet Take 1 tablet (500 mg total) by mouth daily. 02/04/21   Briant Cedar, MD  ?aspirin 81 MG tablet Take 81 mg by mouth daily.      [provider]  ?feeding supplement (ENSURE ENLIVE / ENSURE PLUS) LIQD Take 237 mLs by mouth 3 (three) times daily between meals. 02/03/21   Briant Cedar, MD  ?folic acid (FOLVITE) 1 MG tablet Take 1 tablet (1 mg total) by mouth daily. 02/04/21   Briant Cedar, MD  ?iron polysaccharides (NIFEREX) 150 MG capsule Take 1 capsule (150 mg total) by mouth daily. 02/04/21   Briant Cedar, MD  ?isosorbide mononitrate (IMDUR) 30 MG 24 hr tablet Take 1 tablet (30 mg total) by mouth daily. 08/03/20   Pricilla Riffle, MD  ?metoprolol succinate (TOPROL-XL) 25 MG 24 hr tablet TAKE 1 TABLET BY MOUTH DAILY. PLEASE KEEP UPCOMING APPT IN JUNE 2022 BEFORE ANYMORE REFILLS. 08/14/20   Pricilla Riffle, MD  ?Multiple Vitamin (MULTIVITAMIN WITH MINERALS) TABS tablet Take 1 tablet by mouth daily. 02/04/21   Briant Cedar, MD  ?simvastatin (ZOCOR) 40 MG tablet Take 1 tablet (40 mg total) by mouth daily. at 6pm 08/03/20   Pricilla Riffle, MD  ?thiamine 100 MG tablet Take 1 tablet (100 mg total) by mouth daily. 02/04/21   Briant Cedar, MD  ?vitamin B-12 (CYANOCOBALAMIN) 500 MCG tablet Take 1 tablet (500 mcg total) by mouth daily. 02/04/21   Briant Cedar, MD  ?Vitamin D, Ergocalciferol, (DRISDOL) 1.25 MG (50000 UNIT) CAPS capsule Take 1 capsule (50,000 Units total) by mouth every 7 (seven) days. 02/07/21   Briant Cedar, MD  ? ? ?Physical Exam: ?Vitals:  ? 05/20/21 1730 05/20/21 1830 05/20/21 1930 05/20/21 2000  ?BP: 131/76 129/67 131/84 126/78  ?Pulse: (!) 107 (!) 107 98   ?Resp: 18 20 20    ?Temp:       ?TempSrc:      ?SpO2: 99% 100% 99%   ?Weight:      ?Height:      ? ?Physical Exam ?Vitals and nursing note reviewed.  ?Constitutional:   ?   General: He is not in acute distress. ?Eyes:  ?   Comments: Left periorbital contusion  ?Cardiovascular:  ?   Rate and Rhythm: Normal rate and regular rhythm.  ?   Heart sounds: Normal heart sounds.  ?Pulmonary:  ?   Effort: Pulmonary effort is normal.  ?   Breath sounds: Normal breath sounds.  ?Abdominal:  ?   Palpations: Abdomen is soft.  ?   Tenderness: There is no abdominal tenderness.  ?Skin: ?   Findings: Bruising present.  ?   Comments: Bruising left forearm  ?Neurological:  ?   Mental Status: Mental status is at baseline. He is lethargic.  ? ? ? ?Data Reviewed: ?Relevant notes from primary care and specialist visits, past discharge summaries as available in EHR, including Care Everywhere. ?Prior diagnostic testing as pertinent to current admission diagnoses ?Updated medications and problem lists for  reconciliation ?ED course, including vitals, labs, imaging, treatment and response to treatment ?Triage notes, nursing and pharmacy notes and ED provider's notes ?Notable results as noted in HPI ? ? ?Assessment and Plan: ?* Fall at home, initial encounter ?Patient lives alone and has suspected underlying cognitive deficits ?PT OT consult ?Might need placement ?We will keep n.p.o. tonight ?Fall and aspiration precautions ? ?Subdural hemorrhage (HCC) ?For repeat CT head in 6 hours per neurosurgery ?Neurosurgical consult to follow ? ?Intraparenchymal hemorrhage of brain (HCC) ?Neurochecks ?Keppra 500 twice daily ?Keep SBP under 150  ?Neurology was consulted from ED and will follow, Dr. Selina CooleyStack ? ?Fracture of inferior orbital wall, right (HCC) ?Consider ophthalmology consult in the a.m. ? ?Hematoma of frontal scalp ?Pain control.  Can consider ice ? ?Sinusitis, chronic ?Chronic sinusitis but with finding of right maxillary hemosinus, which may be acute ?Consider ENT  consult ? ?Cognitive deficits ?Consider MMSE when improved.  Might need placement as patient lives alone ? ?AAA (abdominal aortic aneurysm) without rupture (HCC) ?4.4 cm infrarenal abdominal aortic aneurysm on CT on 01/30/21 ? ?Coro

## 2021-05-20 NOTE — Assessment & Plan Note (Signed)
Consider MMSE when improved.  Might need placement as patient lives alone ?

## 2021-05-20 NOTE — Assessment & Plan Note (Addendum)
For repeat CT head in 6 hours per neurosurgery ?Neurosurgical consult to follow ?

## 2021-05-20 NOTE — Consult Note (Signed)
Full note to follow. Case reviewed with Dr. Marisa Severin. ? ?71 yo male found down at home not at baseline.  He is awake and answers questions without a focal deficit.  He is oriented x2 and slow to answer questions. ? ?CT shows multifocal intracranial hemorrhage with multiple intraparenchymal hemorrhages as well as IVH and subarachnoid hemorrhage.  There is also a small L frontal subdural hematoma that appears chronic. ? ?Patient is currently altered but stable. ? ?Multifocal hemorrhage is uncommon in multiple anatomic compartments.  I have recommended CTA to evaluate for vascular abnormality, as well as CT in 6 hours.  We will start keppra 500 mg.  I recommended having Neurology review as well.  If no vascular abnormality is found, neurology may have insight into the etiology.  The current pattern is no consistent with traumatic cause of hemorrhage. ?

## 2021-05-20 NOTE — Assessment & Plan Note (Addendum)
Patient lives alone and has suspected underlying cognitive deficits ?PT OT consult ?Might need placement ?We will keep n.p.o. tonight ?Fall and aspiration precautions ?

## 2021-05-20 NOTE — Assessment & Plan Note (Signed)
Troponin of 10 and EKG nonacute ?

## 2021-05-20 NOTE — Assessment & Plan Note (Signed)
4.4 cm infrarenal abdominal aortic aneurysm on CT on 01/30/21 ?

## 2021-05-20 NOTE — ED Provider Notes (Signed)
? ?[facility] ?Provider Note ? ? ? Event Date/Time  ? First MD Initiated Contact with Patient 05/20/21 1544   ?  (approximate) ? ? ?History  ? ?Fall and Altered Mental Status ? ? ?HPI ? ?Prentiss BellsMichael R [name] is a 71 y.o. male with history of hypertension, hyperlipidemia, CAD, CVA who presents after he was found confused in his bed by family members several hours ago.  It is unclear when he was last seen at his baseline, but it appears to have been several days ago.  The patient himself has no acute complaints.  He was found covered in urine and feces, there was also some blood on the floor and he had bruising and redness around his left eye. ? ? ? ?Physical Exam  ? ?Triage Vital Signs: ?ED Triage Vitals  ?Enc Vitals Group  ?   BP 05/20/21 1600 124/80  ?   Pulse Rate 05/20/21 1600 (!) 114  ?   Resp 05/20/21 1600 20  ?   Temp 05/20/21 1600 (!) 97.4 ?F (36.3 ?C)  ?   Temp Source 05/20/21 1600 Oral  ?   SpO2 05/20/21 1600 97 %  ?   Weight 05/20/21 1606 120 lb (54.4 kg)  ?   Height 05/20/21 1606 6' (1.829 m)  ?   Head Circumference --   ?   Peak Flow --   ?   Pain Score 05/20/21 1558 0  ?   Pain Loc --   ?   Pain Edu? --   ?   Excl. in GC? --   ? ? ?Most recent vital signs: ?Vitals:  ? 05/20/21 2230 05/20/21 2300  ?BP: 132/82   ?Pulse: (!) 111 (!) 103  ?Resp: 14 18  ?Temp:    ?SpO2:  97%  ? ? ?General: Alert, oriented x2, somewhat slow to respond to questions.  Weak appearing. ?CV:  Good peripheral perfusion.  Normal heart sounds. ?Resp:  Normal effort.  Lungs CTAB. ?Abd:  Soft and nontender.  No distention.  ?Other:  Left periorbital ecchymosis.  Left some conjunctival hemorrhage.  EOMI.  PERRLA.  Motor intact in all extremities.  Dry mucous membranes.  No midline cervical, thoracic, or lumbar spinal tenderness.  Scattered bruising upper extremities.  No open wounds.  Full range of motion to bilateral shoulders, elbows, wrists, hips, knees, ankles. ? ? ?ED Results / Procedures / Treatments   ? ?Labs ?(all labs ordered are listed, but only abnormal results are displayed) ?Labs Reviewed  ?COMPREHENSIVE METABOLIC PANEL - Abnormal; Notable for the following components:  ?    Result Value  ? Potassium 3.2 (*)   ? Glucose, Bld 154 (*)   ? Creatinine, Ser 0.58 (*)   ? Total Protein 8.4 (*)   ? AST 50 (*)   ? All other components within normal limits  ?CBC WITH DIFFERENTIAL/PLATELET - Abnormal; Notable for the following components:  ? WBC 13.5 (*)   ? RBC 3.84 (*)   ? Hemoglobin 12.6 (*)   ? HCT 37.5 (*)   ? Neutro Abs 10.5 (*)   ? Monocytes Absolute 1.2 (*)   ? All other components within normal limits  ?LACTIC ACID, PLASMA - Abnormal; Notable for the following components:  ? Lactic Acid, Venous 2.7 (*)   ? All other components within normal limits  ?URINALYSIS, ROUTINE W REFLEX MICROSCOPIC - Abnormal; Notable for the following components:  ? Color, Urine YELLOW (*)   ? APPearance HAZY (*)   ?  Specific Gravity, Urine >1.046 (*)   ? Ketones, ur 5 (*)   ? All other components within normal limits  ?RESP PANEL BY RT-PCR (FLU A&B, COVID) ARPGX2  ?CK  ?ETHANOL  ?URINE DRUG SCREEN, QUALITATIVE ([facility] ONLY)  ?LACTIC ACID, PLASMA  ?TROPONIN I (HIGH SENSITIVITY)  ? ? ? ?EKG ? ?ED ECG REPORT ?IDionne Bucy, the attending physician, personally viewed and interpreted this ECG. ? ?Date: 05/20/2021 ?EKG Time: 1605 ?Rate: 116 ?Rhythm: Sinus tachycardia ?QRS Axis: normal ?Intervals: normal ?ST/T Wave abnormalities: Nonspecific ST abnormalities ?Narrative Interpretation: Nonspecific abnormalities with no evidence of acute ischemia ? ? ? ?RADIOLOGY ? ?I independently viewed and interpreted the images for all of the studies below. ? ?Chest x-ray: No focal consolidation or edema ? ?CT head: Multiple areas of intraparenchymal and subdural hemorrhage ? ?CT maxillofacial: Right inferior orbital wall fracture, undetermined chronicity ? ?CT cervical spine: No acute fracture ? ? ? ?PROCEDURES: ? ?Critical Care performed: Yes,  see critical care procedure note(s) ? ?.Critical Care ?Performed by: Dionne Bucy, MD ?Authorized by: Dionne Bucy, MD  ? ?Critical care provider statement:  ?  Critical care time (minutes):  45 ?  Critical care was necessary to treat or prevent imminent or life-threatening deterioration of the following conditions:  CNS failure or compromise ?  Critical care was time spent personally by me on the following activities:  Development of treatment plan with patient or surrogate, discussions with consultants, evaluation of patient's response to treatment, examination of patient, ordering and review of laboratory studies, ordering and review of radiographic studies, ordering and performing treatments and interventions, pulse oximetry, re-evaluation of patient's condition and review of old charts ?  Care discussed with: admitting provider   ? ? ?MEDICATIONS ORDERED IN ED: ?Medications  ?levETIRAcetam (KEPPRA) IVPB 500 mg/100 mL premix (has no administration in time range)  ?sodium chloride 0.9 % bolus 1,000 mL (0 mLs Intravenous Stopped 05/20/21 1908)  ?desmopressin (DDAVP) 20 mcg in sodium chloride 0.9 % 50 mL IVPB (0 mcg Intravenous Stopped 05/20/21 1957)  ?levETIRAcetam (KEPPRA) IVPB 1500 mg/ 100 mL premix (0 mg Intravenous Stopped 05/20/21 1845)  ?iohexol (OMNIPAQUE) 350 MG/ML injection 75 mL (75 mLs Intravenous Contrast Given 05/20/21 1919)  ? ? ? ?IMPRESSION / MDM / ASSESSMENT AND PLAN / ED COURSE  ?I reviewed the triage vital signs and the nursing notes. ? ?71 year old male with PMH as noted above presents with altered mental status and weakness after he was found in bed by family members after not seeing him for a few days.  He was apparently covered in feces and urine, and there was some blood on the floor suggesting he had fallen.  The patient himself has no complaints. ? ?I reviewed the past medical records.  The patient was most recently admitted in January of this year.  Per the hospitalist  discharge summary from 1/11 the patient presented with recurrent falls and decreased p.o. intake.  He was found to have a UTI. ? ?On exam, the patient is alert but only oriented x2.  He is very weak and emaciated appearing.  Temperature is slightly low and he is tachycardic.  Other vital signs are normal.  Neurologic exam is nonfocal.  He has a left subconjunctival hemorrhage and left periorbital ecchymosis and tenderness.  There is no other visible trauma other than some scattered bruising on his upper extremities.  There are no open wounds. ? ?Differential diagnosis includes, but is not limited to, acute stroke, intracranial hemorrhage, infection/sepsis, dehydration, electrolyte  abnormality, rhabdomyolysis, acute renal insufficiency, hypoglycemia, or other metabolic cause, or possible cardiac etiology. ? ?We will obtain CT head, cervical spine, maxillofacial, chest x-ray, lab work-up, give fluids, and reassess. ? ?The patient is on the cardiac monitor to evaluate for evidence of arrhythmia and/or significant heart rate changes. ? ?----------------------------------------- ?8:20 PM on 05/20/2021 ?----------------------------------------- ? ?CT showed multiple areas of multicompartmental intracranial hemorrhage.  I ordered DDAVP and consulted Dr. Myer Haff from neurology.  He recommended started the patient on Keppra and obtaining a neurology consult.  He advises that this type of bleed does not require surgical management or other procedure, and can be admitted to [facility].  I then consulted Dr. Selina Cooley.  Based on our discussion she agrees with the above management.  She and Dr. Myer Haff wanted a CT angio which is negative for aneurysm. ? ?On reassessment, the patient remains stable.  He is alert and somewhat altered.  He is maintaining his airway.  Vital signs remain stable.  I had an extensive discussion with him and with the family members counseling them on the results of the work-up, prognosis, and plan of care.   We will plan for admission to stepdown. ? ?I then consulted Dr. Para March from the hospitalist service; based on our discussion she agrees to admit the patient. ? ? ? ?FINAL CLINICAL IMPRESSION(S) / ED DIAGNOSES  ? ?Final di

## 2021-05-20 NOTE — ED Triage Notes (Signed)
Pt BIB [facility] after being found in the bed after being in the floor for 2 days. Pt covered in feces and urine. Pt family found pt this morning around 1000. Pt ams. ?CBG 160 ?138/84 ?

## 2021-05-20 NOTE — Assessment & Plan Note (Signed)
Chronic sinusitis but with finding of right maxillary hemosinus, which may be acute ?Consider ENT consult ?

## 2021-05-20 NOTE — Assessment & Plan Note (Addendum)
CTA with No intracranial large vessel occlusion or significant stenosis. No ?evidence of vascular malformation or aneurysm ?Neurochecks, fall and aspiration precautions ?Continuous cardiac monitoring.  Had echo in January so will not repeat ?Keppra 500 twice daily ?Keep SBP under 150  ?Neurology was consulted from ED and will follow, Dr. Selina Cooley ?

## 2021-05-21 ENCOUNTER — Inpatient Hospital Stay: Payer: Medicare Other

## 2021-05-21 ENCOUNTER — Encounter: Payer: Self-pay | Admitting: Internal Medicine

## 2021-05-21 DIAGNOSIS — I629 Nontraumatic intracranial hemorrhage, unspecified: Secondary | ICD-10-CM | POA: Diagnosis not present

## 2021-05-21 DIAGNOSIS — R4189 Other symptoms and signs involving cognitive functions and awareness: Secondary | ICD-10-CM | POA: Diagnosis not present

## 2021-05-21 DIAGNOSIS — I6200 Nontraumatic subdural hemorrhage, unspecified: Secondary | ICD-10-CM

## 2021-05-21 DIAGNOSIS — S0231XA Fracture of orbital floor, right side, initial encounter for closed fracture: Secondary | ICD-10-CM | POA: Diagnosis not present

## 2021-05-21 DIAGNOSIS — I619 Nontraumatic intracerebral hemorrhage, unspecified: Secondary | ICD-10-CM

## 2021-05-21 DIAGNOSIS — S0512XA Contusion of eyeball and orbital tissues, left eye, initial encounter: Secondary | ICD-10-CM

## 2021-05-21 DIAGNOSIS — W19XXXA Unspecified fall, initial encounter: Secondary | ICD-10-CM | POA: Diagnosis not present

## 2021-05-21 DIAGNOSIS — Y92009 Unspecified place in unspecified non-institutional (private) residence as the place of occurrence of the external cause: Secondary | ICD-10-CM | POA: Diagnosis not present

## 2021-05-21 DIAGNOSIS — I609 Nontraumatic subarachnoid hemorrhage, unspecified: Secondary | ICD-10-CM

## 2021-05-21 LAB — BASIC METABOLIC PANEL
Anion gap: 7 (ref 5–15)
BUN: 7 mg/dL — ABNORMAL LOW (ref 8–23)
CO2: 24 mmol/L (ref 22–32)
Calcium: 8.7 mg/dL — ABNORMAL LOW (ref 8.9–10.3)
Chloride: 104 mmol/L (ref 98–111)
Creatinine, Ser: 0.53 mg/dL — ABNORMAL LOW (ref 0.61–1.24)
GFR, Estimated: >60 mL/min (ref >60)
Glucose, Bld: 96 mg/dL (ref 70–99)
Potassium: 3.2 mmol/L — ABNORMAL LOW (ref 3.5–5.1)
Sodium: 135 mmol/L (ref 135–145)

## 2021-05-21 LAB — HEMOGLOBIN A1C
Hgb A1c MFr Bld: 5.6 % (ref 4.8–5.6)
Mean Plasma Glucose: 114.02 mg/dL

## 2021-05-21 LAB — LIPID PANEL
Cholesterol: 195 mg/dL (ref 0–200)
HDL: 87 mg/dL (ref >40)
LDL Cholesterol: 83 mg/dL (ref 0–99)
Total CHOL/HDL Ratio: 2.2 RATIO
Triglycerides: 123 mg/dL (ref <150)
VLDL: 25 mg/dL (ref 0–40)

## 2021-05-21 LAB — CBC
HCT: 31.9 % — ABNORMAL LOW (ref 39.0–52.0)
Hemoglobin: 10.7 g/dL — ABNORMAL LOW (ref 13.0–17.0)
MCH: 31.9 pg (ref 26.0–34.0)
MCHC: 33.5 g/dL (ref 30.0–36.0)
MCV: 95.2 fL (ref 80.0–100.0)
Platelets: 155 10*3/uL (ref 150–400)
RBC: 3.35 MIL/uL — ABNORMAL LOW (ref 4.22–5.81)
RDW: 12.8 % (ref 11.5–15.5)
WBC: 7.7 10*3/uL (ref 4.0–10.5)
nRBC: 0 % (ref 0.0–0.2)

## 2021-05-21 LAB — MRSA NEXT GEN BY PCR, NASAL: MRSA by PCR Next Gen: NOT DETECTED

## 2021-05-21 LAB — LACTIC ACID, PLASMA: Lactic Acid, Venous: 1 mmol/L (ref 0.5–1.9)

## 2021-05-21 LAB — GLUCOSE, CAPILLARY
Glucose-Capillary: 98 mg/dL (ref 70–99)
Glucose-Capillary: 98 mg/dL (ref 70–99)

## 2021-05-21 LAB — PHOSPHORUS: Phosphorus: 2.7 mg/dL (ref 2.5–4.6)

## 2021-05-21 LAB — MAGNESIUM: Magnesium: 1.9 mg/dL (ref 1.7–2.4)

## 2021-05-21 MED ORDER — SODIUM CHLORIDE 0.9 % IV SOLN: INTRAVENOUS | Status: DC

## 2021-05-21 MED ORDER — CHLORHEXIDINE GLUCONATE CLOTH 2 % EX PADS
6.0000 | MEDICATED_PAD | Freq: Every day | CUTANEOUS | Status: DC
  Administered 2021-05-22: 6 via TOPICAL

## 2021-05-21 MED ORDER — LORAZEPAM 2 MG/ML IJ SOLN
0.5000 mg | Freq: Once | INTRAMUSCULAR | Status: DC | PRN
Start: 2021-05-21 — End: 2021-05-26

## 2021-05-21 MED ORDER — ACETAMINOPHEN 650 MG RE SUPP: 650.0000 mg | RECTAL | Status: DC | PRN

## 2021-05-21 MED ORDER — ACETAMINOPHEN 160 MG/5ML PO SOLN: 650.0000 mg | ORAL | Status: DC | PRN

## 2021-05-21 MED ORDER — STROKE: EARLY STAGES OF RECOVERY BOOK: Freq: Once | Status: DC

## 2021-05-21 MED ORDER — RISPERIDONE 1 MG PO TBDP: 0.5000 mg | ORAL_TABLET | Freq: Every day | ORAL | Status: DC

## 2021-05-21 MED ORDER — HALOPERIDOL LACTATE 5 MG/ML IJ SOLN
5.0000 mg | Freq: Four times a day (QID) | INTRAMUSCULAR | Status: DC | PRN
  Administered 2021-05-21 – 2021-05-24 (×3): 5 mg via INTRAMUSCULAR
  Filled 2021-05-21 (×3): qty 1

## 2021-05-21 MED ORDER — ACETAMINOPHEN 325 MG PO TABS: 650.0000 mg | ORAL_TABLET | ORAL | Status: DC | PRN

## 2021-05-21 NOTE — ED Notes (Signed)
Neurology at bedside.

## 2021-05-21 NOTE — Consult Note (Signed)
NEUROLOGY CONSULTATION NOTE  ? ?Date of service: May 21, 2021 ?Patient Name: [name] ?MRN:  [id number] ?DOB:  10/08/1950 ?Reason for consult: ICH ?Requesting physician: Dr. Judd Gaudier ?_ _ _   _ __   _ __ _ _  __ __   _ __   __ _ ? ?History of Present Illness  ? ?71 yo man with HTN, HL, CAD, suspected baseline cognitive impairment who lives alone, brought in by family for encephalopathy after being found in bed covered in urine and feces. Was last seen by family a few days prior. Blood was noted on floor by EMS and patient has extensive bruising around L eye. CT head showed extensive hemorrhage: ? ?1. Multi compartment acute intracranial hemorrhage. ?2. Multiple foci of intraparenchymal hemorrhage, largest in the ?right temporal lobe measuring 2.1 x 2.1 x 2.9 cm. There also ?bifrontal hemorrhages. ?3. Diffuse subarachnoid hemorrhage. Intraventricular hemorrhage ?within the left lateral ventricle and fourth ventricle, lesser ?within basilar cisterns. ?4. Subdural hemorrhage along the falx and right frontal and temporal ?lobe. ?5. Intraventricular blood within the left lateral ventricle fourth ?ventricle and to a lesser extent basilar cisterns. ?6. Extra-axial collection overlying the left frontal convexity ?measuring 4 mm is heterogeneous in density, possible chronic ?subdural hematoma or hygroma although new from January. ?7. Mild effacement of sulci on the right.  No midline shift. ? ?Head CT personally reviewed; I agree with above interpretation. ? ?CTA H&N - unremarkable, no aneurysm or AVM (personal review) ? ?CT  maxillofacial ?1. Nondisplaced right inferior orbital wall fracture, age ?indeterminate. There is right maxillary hemosinus suggesting this ?may be acute. ?2. Left frontal scalp hematoma. Left infraorbital soft tissue edema. ?3. Chronic sinusitis with opacification of left side of sphenoid ?sinus, many ethmoid air cells, and majority of right maxillary ?sinus. Chronic cortical thickening,  sclerosis and hypertrophy of the ?left maxillary and left side of sphenoid sinus. ?4. Multiple missing teeth, poor dentition. ? ?Neurosurgery was consulted who recommended DDAVP and keppra prophylaxis 500mg  bid. Patient admitted for further mgmt. ?  ?ROS  ? ?UTA 2/2 encephalopathy ? ?Past History  ? ?I have reviewed the following: ? ?Past Medical History:  ?Diagnosis Date  ? AAA (abdominal aortic aneurysm) without rupture ([place]) 04/29/2021  ? CT in Jan 2023: 4.4. infrarenal abdominal aortic aneurysm   ? Cerebrovascular disease   ? Coronary artery disease   ? Dyslipidemia   ? Hypertension   ? Pain in joint, shoulder region   ? ?Past Surgical History:  ?Procedure Laterality Date  ? CAROTID ENDARTERECTOMY    ? left/Jan. 2006/ right;Dacron patch angioplasty  ? PTCA    ? stent placement January 2006  ? ?Family History  ?Problem Relation Age of Onset  ? Alzheimer's disease Mother   ? Heart disease Father   ? ?Social History  ? ?Socioeconomic History  ? Marital status: Widowed  ?  Spouse name: Not on file  ? Number of children: Not on file  ? Years of education: Not on file  ? Highest education level: Not on file  ?Occupational History  ? Occupation: Engineer, drilling  ?  Comment: [employer]  ?Tobacco Use  ? Smoking status: Every Day  ?  Types: E-cigarettes  ? Smokeless tobacco: Never  ? Tobacco comments:  ?  Smokes electronic ciggarettes  ?Substance and Sexual Activity  ? Alcohol use: Yes  ?  Alcohol/week: 21.0 standard drinks  ?  Types: 21 Shots of liquor per week  ? Drug use: Never  ?  Sexual activity: Not on file  ?Other Topics Concern  ? Not on file  ?Social History Narrative  ? Not on file  ? ?Social Determinants of Health  ? ?Financial Resource Strain: Not on file  ?Food Insecurity: Not on file  ?Transportation Needs: Not on file  ?Physical Activity: Not on file  ?Stress: Not on file  ?Social Connections: Not on file  ? ?No Known Allergies ? ?Medications  ? ?Medications Prior to Admission  ?Medication Sig Dispense Refill Last  Dose  ? amLODipine (NORVASC) 5 MG tablet TAKE 1 TABLET (5 MG TOTAL) BY MOUTH DAILY. 90 tablet 3   ? ascorbic acid (VITAMIN C) 500 MG tablet Take 1 tablet (500 mg total) by mouth daily.     ? aspirin 81 MG tablet Take 81 mg by mouth daily.       ? feeding supplement (ENSURE ENLIVE / ENSURE PLUS) LIQD Take 237 mLs by mouth 3 (three) times daily between meals. [id number] mL 12   ? folic acid (FOLVITE) 1 MG tablet Take 1 tablet (1 mg total) by mouth daily.     ? iron polysaccharides (NIFEREX) 150 MG capsule Take 1 capsule (150 mg total) by mouth daily.     ? isosorbide mononitrate (IMDUR) 30 MG 24 hr tablet Take 1 tablet (30 mg total) by mouth daily. 90 tablet 3   ? metoprolol succinate (TOPROL-XL) 25 MG 24 hr tablet TAKE 1 TABLET BY MOUTH DAILY. PLEASE KEEP UPCOMING APPT IN JUNE 2022 BEFORE ANYMORE REFILLS. 90 tablet 3   ? Multiple Vitamin (MULTIVITAMIN WITH MINERALS) TABS tablet Take 1 tablet by mouth daily.     ? simvastatin (ZOCOR) 40 MG tablet Take 1 tablet (40 mg total) by mouth daily. at 6pm 90 tablet 3   ? thiamine 100 MG tablet Take 1 tablet (100 mg total) by mouth daily.     ? vitamin B-12 (CYANOCOBALAMIN) 500 MCG tablet Take 1 tablet (500 mcg total) by mouth daily.     ? Vitamin D, Ergocalciferol, (DRISDOL) 1.25 MG (50000 UNIT) CAPS capsule Take 1 capsule (50,000 Units total) by mouth every 7 (seven) days. 7 capsule 0   ?  ? ? ?Current Facility-Administered Medications:  ?   stroke: early stages of recovery book, , Does not apply, Once, Athena Masse, MD ?  0.9 %  sodium chloride infusion, , Intravenous, Continuous, Athena Masse, MD, Last Rate: 75 mL/hr at 05/21/21 1900, Infusion Verify at 05/21/21 1900 ?  acetaminophen (TYLENOL) tablet 650 mg, 650 mg, Oral, Q4H PRN **OR** acetaminophen (TYLENOL) 160 MG/5ML solution 650 mg, 650 mg, Per Tube, Q4H PRN **OR** acetaminophen (TYLENOL) suppository 650 mg, 650 mg, Rectal, Q4H PRN, Athena Masse, MD ?  Derrill Memo ON 05/22/2021] Chlorhexidine Gluconate Cloth 2 % PADS 6  each, 6 each, Topical, Q0600, Athena Masse, MD ?  haloperidol lactate (HALDOL) injection 5 mg, 5 mg, Intramuscular, Q6H PRN, Jennye Boroughs, MD, 5 mg at 05/21/21 1510 ?  levETIRAcetam (KEPPRA) IVPB 500 mg/100 mL premix, 500 mg, Intravenous, Q12H, Athena Masse, MD, Stopped at 05/21/21 [phone] ?  LORazepam (ATIVAN) injection 0.5 mg, 0.5 mg, Intravenous, Once PRN, Derek Jack, MD ? ?Vitals  ? ?Vitals:  ? 05/21/21 1600 05/21/21 1700 05/21/21 1800 05/21/21 1900  ?BP: (!) 140/96  (!) 148/85 136/85  ?Pulse: 90 (!) 104 89 87  ?Resp: 15 15 13 16   ?Temp: 99.2 ?F (37.3 ?C)     ?TempSrc: Oral     ?SpO2: 98% 96% 98%  98%  ?Weight:      ?Height:      ?  ? ?Body mass index is 17.22 kg/m?. ? ?Physical Exam  ? ?Physical Exam ?Gen: alert, oriented to self and hospital only ?Head: L periorbital bruising ?Resp: CTAB, no w/r/r ?CV: RRR, no m/g/r; nml S1 and S2. 2+ symmetric peripheral pulses. ? ?Neuro: ?*MS: alert, oriented to self and hospital only ?*Speech: mild dysarthria, no aphasia ?*CN: PERRL, EOMI, blinks to threat bilat, sensation intact, face symmetric, hearing intact to voice ?*Motor:   Normal bulk.  No tremor, rigidity or bradykinesia.  ? ?  Strength: Dlt Bic Tri WrE WrF FgS Gr HF KnF KnE PlF DoF  ?  Left 4+ 5 4- 4 4 4 4 4 4  4+ 4 4  ?  Right 5 5 5 5 5 5 5 5 5 5 5 5   ? ? ?*Sensory: SILT. No double-simultaneous extinction.  ?*Coordination:  FNF intact bilat ?*Reflexes:  2+ and symmetric throughout without clonus; toes down-going bilat ?*Gait: deferred ? ?ICH score = 1 ? ? ?Labs  ? ?CBC:  ?Recent Labs  ?Lab 05/20/21 ?1616  ?WBC 13.5*  ?NEUTROABS 10.5*  ?HGB 12.6*  ?HCT 37.5*  ?MCV 97.7  ?PLT 203  ? ? ?Basic Metabolic Panel:  ?Lab Results  ?Component Value Date  ? NA 137 05/20/2021  ? K 3.2 (L) 05/20/2021  ? CO2 28 05/20/2021  ? GLUCOSE 154 (H) 05/20/2021  ? BUN 16 05/20/2021  ? CREATININE 0.58 (L) 05/20/2021  ? CALCIUM 9.8 05/20/2021  ? GFRNONAA >60 05/20/2021  ? GFRAA 95 03/25/2019  ? ?Lipid Panel:  ?Lab Results   ?Component Value Date  ? [place] 83 05/20/2021  ? ?HgbA1c:  ?Lab Results  ?Component Value Date  ? HGBA1C 5.6 05/20/2021  ? ?Urine Drug Screen:  ?   ?Component Value Date/Time  ? LABOPIA NONE DETECTED 05/20/2021 2

## 2021-05-21 NOTE — ED Notes (Signed)
Pt found to have got himself out of one of his restraints. Restraint applied. Sitter now at bedside for safety.  ?

## 2021-05-21 NOTE — Progress Notes (Signed)
PT Cancellation Note ? ?Patient Details ?Name: [name] ?MRN: [id number] ?DOB: [birth year] ? ? ?Cancelled Treatment:    Reason Eval/Treat Not Completed: Medical issues which prohibited therapy.  Chart reviewed and attempted to see pt.  Nursing requesting and suggesting that therapist not see pt, but asked if therapist could assist with getting his legs back in bed.  Assisted pt's legs back into bed and will re-attempt evaluation at later date/time.   ? ? ?Gwenlyn Saran, PT, DPT ?05/21/21, 4:48 PM ? ?

## 2021-05-21 NOTE — ED Notes (Signed)
Head of bed raised to 30 degrees per neurology request.  ?

## 2021-05-21 NOTE — Progress Notes (Signed)
Patient returned from MRI at 1845, patient had voided in MRI per report of transport, patient's linens changed and resting comfortably in bed. ?

## 2021-05-21 NOTE — Consult Note (Signed)
? ?NAME:  [name], MRN:  [id number], DOB:  06-22-1950, LOS: 1 ?ADMISSION DATE:  05/20/2021, CONSULTATION DATE:  05/21/2021 ?REFERRING MD:  Dr. Quinn Axe, CHIEF COMPLAINT:  AMS, Fall  ? ?Brief Pt Description / Synopsis:  ?71 year old male admitted s/p fall with acute metabolic encephalopathy in the setting of multifocal intracranial hemorrhages with multiple intraparenchymal hemorrhages and subarachnoid hemorrhage.  Neurology and Neurosurgery following. ? ?History of Present Illness:  ?[name] is a 71 year old male with a past medical history significant for hypertension, hyperlipidemia, coronary artery disease who presents to [facility] ED on 05/20/2021 status post fall and altered mental status.  Patient remains altered, therefore history is obtained from his brother at bedside along with chart review. ? ?Per the patient's brother, family last talked to him on Tuesday, and he was found yesterday on Thursday altered in his bed, covered with urine and feces.  He was noted to have bruising around his left eye and his forehead, and there was some blood on the floor suggesting he had fallen. ? ?Of note, he was hospitalized approximately 2 months ago from 1//23 through 01/1121 with severe sepsis secondary to UTI, fall.  He was discharged to rehab facility, and was then discharged home. ? ?ED Course: ?Initial Vital Signs: Temperature 97.4 ?F orally, pulse 114, respiratory rate 20, blood pressure 124/80, SPO2 97% on room air ?Significant Labs: Potassium 3.2, glucose 154, AST 50, CK 94, high-sensitivity troponin 10, lactic acid 2.7, WBC 13.5 with neutrophilia, hemoglobin 12.6, hematocrit 37.5, hemoglobin A1c 5.6 ?COVID-19 and influenza PCR negative ?Urine drug screen negative, ethyl alcohol less than 10 ?Imaging ?Chest X-ray>>IMPRESSION: ?Background emphysematous changes, similar to prior. No focal ?consolidation. ?CT head without contrast>>IMPRESSION: ?1. Multi compartment acute intracranial hemorrhage. ?2. Multiple foci  of intraparenchymal hemorrhage, largest in the ?right temporal lobe measuring 2.1 x 2.1 x 2.9 cm. There also ?bifrontal hemorrhages. ?3. Diffuse subarachnoid hemorrhage. Intraventricular hemorrhage ?within the left lateral ventricle and fourth ventricle, lesser ?within basilar cisterns. ?4. Subdural hemorrhage along the falx and right frontal and temporal ?lobe. ?5. Intraventricular blood within the left lateral ventricle fourth ?ventricle and to a lesser extent basilar cisterns. ?6. Extra-axial collection overlying the left frontal convexity ?measuring 4 mm is heterogeneous in density, possible chronic ?subdural hematoma or hygroma although new from January. ?7. Mild effacement of sulci on the right.  No midline shift. ?CT cervical spine>>IMPRESSION: ?1. No acute fracture or traumatic subluxation of the cervical spine. ?2. Multilevel degenerative disc disease and facet hypertrophy. ?CTA head >>IMPRESSION: ?No intracranial large vessel occlusion or significant stenosis. No ?evidence of vascular malformation or aneurysm. ?Medications Administered: DDAVP, IV Keppra ? ?Neurology was consulted, recommended against surgical management/procedure.  Hospitalist were asked to admit the patient to stepdown. ? ?This morning patient is being evaluated by neurology, of who request PCCM consultation for further assistance with frequent monitoring and strict blood pressure control. ? ?Pertinent  Medical History  ? ?Past Medical History:  ?Diagnosis Date  ? AAA (abdominal aortic aneurysm) without rupture ([place]) 04/29/2021  ? CT in Jan 2023: 4.4. infrarenal abdominal aortic aneurysm   ? Cerebrovascular disease   ? Coronary artery disease   ? Dyslipidemia   ? Hypertension   ? Pain in joint, shoulder region   ? ? ? ?Micro Data:  ?4/27: SARS-CoV-2 and influenza PCR>> negative ? ?Antimicrobials:  ?N/A ? ?Significant Hospital Events: ?Including procedures, antibiotic start and stop dates in addition to other pertinent events   ?4/27:  Presents to ED due to fall and altered  mental status.  Found to have multiple compartment intracranial hemorrhages and subdural hemorrhage.  Neurosurgery consulted, recommended against surgical intervention. ?4/28: Being evaluated by neurology.  Request PCCM consultation for frequent monitoring and strict blood pressure control ? ?Interim History / Subjective:  ?-Neurology evaluated patient in the ED ?-Upon evaluation, patient is calm and cooperative, no focal deficits noted, oriented x2 ?-To be admitted to stepdown/ICU for close neurological monitoring and strict blood pressure control ?-Afebrile, hemodynamically stable, systolic blood pressure less than 150, currently protecting his airway ?-Plan for MRI ? ?Objective   ?Blood pressure (!) 141/88, pulse 97, temperature (!) 97.4 ?F (36.3 ?C), temperature source Oral, resp. rate 18, height 6' (1.829 m), weight 54.4 kg, SpO2 98 %. ?   ?   ? ?Intake/Output Summary (Last 24 hours) at 05/21/2021 1012 ?Last data filed at 05/20/2021 1957 ?Gross per 24 hour  ?Intake 1050 ml  ?Output --  ?Net 1050 ml  ? ?Filed Weights  ? 05/20/21 1606  ?Weight: 54.4 kg  ? ? ?Examination: ?General: Acute on chronically ill-appearing frail male, laying in bed, on room air, no acute distress ?HENT: Traumatic with ecchymosis to forehead and left periorbital ecchymosis.  Some left conjunctival hemorrhage, pupils PERRLA, dry mucous membranes ?Lungs: Clear breath sounds bilaterally, no wheezing or rales noted, even, nonlabored, protecting his airway ?Cardiovascular: Regular rate and rhythm, S1-S2, no murmurs, rubs, gallops ?Abdomen: Soft, nontender, nondistended, no guarding rebound tenderness, bowel sound positive for ?Extremities: Scattered bruising throughout, no deformities, no edema ?Neuro: Sleeping, arouses easily to voice, oriented x2, moves all extremities to command, no focal deficits, is somewhat slow to respond to questions ?GU: Deferred ? ?Resolved Hospital Problem list    ?N/A ? ?Assessment & Plan:  ? ?Acute Metabolic Encephalopathy in setting of multiple Intracerebral Hemorrhages and Subdural Hemorrhage ?PMHx: suspected underlying cognitive deficits ?-ICU/stepdown monitoring ?-Frequent neurochecks as per ICH protocol ?-Avoid sedating meds as able to allow for accurate neuro exams ?-Implement measures to prevent increased ICP : HOB >30 degrees, pain control, normothermia, normovolemia ?-Maintain SBP <150 ?-Prn Hydralazine, Labetalol, and Nicardipine gtt if needed to maintain goal SBP ?-Seizure prophylaxis with IV Keppra 500 mg BID as per Neurosurgery ?-NO antiplatelets or anticoagulants (received DDAVP in ED) ?-Neurosurgery following, appreciate input ~ no recommendation for surgical intervention at this time ?-Neurology following, appreciate input ?-MRI brain is pending ?-PT/OT/Speech evaluation when able ? ? ?Best Practice (right click and "Reselect all SmartList Selections" daily)  ? ?Diet/type: NPO, speech evaluation pending ?DVT prophylaxis: SCD ?GI prophylaxis: N/A ?Lines: N/A ?Foley:  N/A ?Code Status:  full code ?Last date of multidisciplinary goals of care discussion [N/A] ? ?Updated pt's brother Chrissie Noa at bedside 05/21/21.  All questions answered to his satisfaction. ? ?Labs   ?CBC: ?Recent Labs  ?Lab 05/20/21 ?1616  ?WBC 13.5*  ?NEUTROABS 10.5*  ?HGB 12.6*  ?HCT 37.5*  ?MCV 97.7  ?PLT 203  ? ? ?Basic Metabolic Panel: ?Recent Labs  ?Lab 05/20/21 ?1616  ?NA 137  ?K 3.2*  ?CL 98  ?CO2 28  ?GLUCOSE 154*  ?BUN 16  ?CREATININE 0.58*  ?CALCIUM 9.8  ? ?GFR: ?Estimated Creatinine Clearance: 64.2 mL/min (A) (by C-G formula based on SCr of 0.58 mg/dL (L)). ?Recent Labs  ?Lab 05/20/21 ?1610 05/20/21 ?1616  ?WBC  --  13.5*  ?LATICACIDVEN 2.7*  --   ? ? ?Liver Function Tests: ?Recent Labs  ?Lab 05/20/21 ?1616  ?AST 50*  ?ALT 32  ?ALKPHOS 92  ?BILITOT 0.8  ?PROT 8.4*  ?ALBUMIN 4.2  ? ?  No results for input(s): LIPASE, AMYLASE in the last 168 hours. ?No results for input(s): AMMONIA in  the last 168 hours. ? ?ABG ?No results found for: PHART, PCO2ART, PO2ART, HCO3, TCO2, ACIDBASEDEF, O2SAT  ? ?Coagulation Profile: ?No results for input(s): INR, PROTIME in the last 168 hours. ? ?Cardiac Enzymes: ?Recent Labs

## 2021-05-21 NOTE — Progress Notes (Signed)
Patient taken down to MRI via bed and transport with cardiac monitor on.  Patient in stable condition upon leaving the unit. ?

## 2021-05-21 NOTE — ED Notes (Signed)
Restraints released, sitter at bedside.  ?

## 2021-05-21 NOTE — Assessment & Plan Note (Signed)
?

## 2021-05-21 NOTE — Progress Notes (Signed)
OT Cancellation Note ? ?Patient Details ?Name: [name] ?MRN: [id number] ?DOB: March 26, 1950 ? ? ?Cancelled Treatment:    Reason Eval/Treat Not Completed: Patient not medically ready. OT order received and chart reviewed. Per multiple staff on pt's caseload, pt is not appropriate for therapy evaluations at this time. Plan for MRI this morning. OT to continue to follow and re-attempt at next available time when pt can safety participate.  ? ?Jackquline Denmark, MS, OTR/L , CBIS ?ascom [phone]  ?05/21/21, 9:42 AM  ?

## 2021-05-21 NOTE — Consult Note (Signed)
Neurosurgery-New Consultation Evaluation ?05/21/2021 ?[name] [id number] ? ?Identifying Statement: ?[name] is a 71 y.o. male from [place] [address] with a history of HTN, HLD, CAD s/p stent placement, CVA and recent hospitalization in new area for altered mental status found to have acute cystitis. ? ?Physician Requesting Consultation: No ref. provider found ? ?History of Present Illness: ?[name] is a 71 y.o with the above medical history presenting to the ED after being found by his family to be confused at home yesterday. His brother is at bedside and provides the majority of his history due to the patient's mental status.  ?The patient's brother states that he was recently admitted for altered mental status in January and ultimately discharged to rehab and subsequently discharged home with home health nursing but has returned to his independent living in the last couple of months and was even driving a couple of weeks ago.  He was found yesterday afternoon at home in bed confused with a bruise over his left eye and unable to articulate the cause.  He was found covered in feces and urine and blood on floor suggesting a fall however the patient was not able to articulate any complaints and is not able to articulate any now.  When asked if he is in pain he does point to his left eye. The himself is unfortunately unable to articulate any additional details regarding the series of events leading to his hospitalization. ? ?Past Medical History:  ?Past Medical History:  ?Diagnosis Date  ? AAA (abdominal aortic aneurysm) without rupture ([facility]) 04/29/2021  ? CT in Jan 2023: 4.4. infrarenal abdominal aortic aneurysm   ? Cerebrovascular disease   ? Coronary artery disease   ? Dyslipidemia   ? Hypertension   ? Pain in joint, shoulder region   ? ? ?Social History: ?Social History  ? ?Socioeconomic History  ? Marital status: Widowed  ?  Spouse name: Not on file  ? Number of children: Not on file  ?  Years of education: Not on file  ? Highest education level: Not on file  ?Occupational History  ? Occupation: Production designer, theatre/television/film  ?  Comment: [employer]  ?Tobacco Use  ? Smoking status: Every Day  ?  Types: E-cigarettes  ? Smokeless tobacco: Never  ? Tobacco comments:  ?  Smokes electronic ciggarettes  ?Substance and Sexual Activity  ? Alcohol use: Yes  ?  Alcohol/week: 21.0 standard drinks  ?  Types: 21 Shots of liquor per week  ? Drug use: Never  ? Sexual activity: Not on file  ?Other Topics Concern  ? Not on file  ?Social History Narrative  ? Not on file  ? ?Social Determinants of Health  ? ?Financial Resource Strain: Not on file  ?Food Insecurity: Not on file  ?Transportation Needs: Not on file  ?Physical Activity: Not on file  ?Stress: Not on file  ?Social Connections: Not on file  ?Intimate Partner Violence: Not on file  ? ?Living arrangements (living alone, with partner): alone  ? ?Family History: ?Family History  ?Problem Relation Age of Onset  ? Alzheimer's disease Mother   ? Heart disease Father   ? ? ?Review of Systems: ? ?Pt unable to participate in ROS ? ?Physical Exam: ?BP (!) 141/88   Pulse 97   Temp (!) 97.4 ?F (36.3 ?C) (Oral)   Resp 18   Ht 6' (1.829 m)   Wt 54.4 kg   SpO2 98%   BMI 16.27 kg/m?  Body mass  index is 16.27 kg/m?Marland Kitchen Body surface area is 1.66 meters squared. ?General appearance:drowsy but arouses easily to voice ?Head: Obvious ecchymosis over left eye and frontal region no obvious skull deformity or step-off ?Eyes: left subconjunctival hemorrhage with.  Orbital ecchymosis and swelling. ?Oropharynx: Moist without lesions ?Neck: Supple, no tenderness ?Heart: Normal, regular rate and rhythm, without murmur ?Lungs: no increased work of breathing  ?Abdomen: Soft, nondistended ?Ext: Scattered bruising on extremities in various stages of healing. ? ?Neurologic exam:  ?Mental status: Drowsy but arouses to voice.  Oriented to self only.  When asked location [place]   Unable to provide year or month.   ?Speech: slowed and slurred ?Cranial nerves:  ?CN II-XII grossly intact ?Motor: MAEW and equally. No obvious pronator drift.  ?Sensory: pt unable to articulate sensation but does withdraw to painful stimuli ?Reflexes: 2+ and symmetric bilaterally for arms and legs ?Coordination: pt unable to participate  ?Gait: untested  ? ?Laboratory: ?Results for orders placed or performed during the hospital encounter of 05/20/21  ?Resp Panel by RT-PCR (Flu A&B, Covid) Nasopharyngeal Swab  ? Specimen: Nasopharyngeal Swab; Nasopharyngeal(NP) swabs in vial transport medium  ?Result Value Ref Range  ? SARS Coronavirus 2 by RT PCR NEGATIVE NEGATIVE  ? Influenza A by PCR NEGATIVE NEGATIVE  ? Influenza B by PCR NEGATIVE NEGATIVE  ?CK  ?Result Value Ref Range  ? Total CK 94 49 - 397 U/L  ?Comprehensive metabolic panel  ?Result Value Ref Range  ? Sodium 137 135 - 145 mmol/L  ? Potassium 3.2 (L) 3.5 - 5.1 mmol/L  ? Chloride 98 98 - 111 mmol/L  ? CO2 28 22 - 32 mmol/L  ? Glucose, Bld 154 (H) 70 - 99 mg/dL  ? BUN 16 8 - 23 mg/dL  ? Creatinine, Ser 0.58 (L) 0.61 - 1.24 mg/dL  ? Calcium 9.8 8.9 - 10.3 mg/dL  ? Total Protein 8.4 (H) 6.5 - 8.1 g/dL  ? Albumin 4.2 3.5 - 5.0 g/dL  ? AST 50 (H) 15 - 41 U/L  ? ALT 32 0 - 44 U/L  ? Alkaline Phosphatase 92 38 - 126 U/L  ? Total Bilirubin 0.8 0.3 - 1.2 mg/dL  ? GFR, Estimated >60 >60 mL/min  ? Anion gap 11 5 - 15  ?CBC with Differential  ?Result Value Ref Range  ? WBC 13.5 (H) 4.0 - 10.5 K/uL  ? RBC 3.84 (L) 4.22 - 5.81 MIL/uL  ? Hemoglobin 12.6 (L) 13.0 - 17.0 g/dL  ? HCT 37.5 (L) 39.0 - 52.0 %  ? MCV 97.7 80.0 - 100.0 fL  ? MCH 32.8 26.0 - 34.0 pg  ? MCHC 33.6 30.0 - 36.0 g/dL  ? RDW 13.2 11.5 - 15.5 %  ? Platelets 203 150 - 400 K/uL  ? nRBC 0.0 0.0 - 0.2 %  ? Neutrophils Relative % 79 %  ? Neutro Abs 10.5 (H) 1.7 - 7.7 K/uL  ? Lymphocytes Relative 12 %  ? Lymphs Abs 1.6 0.7 - 4.0 K/uL  ? Monocytes Relative 9 %  ? Monocytes Absolute 1.2 (H) 0.1 - 1.0 K/uL  ?  Eosinophils Relative 0 %  ? Eosinophils Absolute 0.0 0.0 - 0.5 K/uL  ? Basophils Relative 0 %  ? Basophils Absolute 0.0 0.0 - 0.1 K/uL  ? Immature Granulocytes 0 %  ? Abs Immature Granulocytes 0.06 0.00 - 0.07 K/uL  ?Lactic acid, plasma  ?Result Value Ref Range  ? Lactic Acid, Venous 2.7 (HH)  0.5 - 1.9 mmol/L  ?Urinalysis, Routine w reflex microscopic Urine, Clean Catch  ?Result Value Ref Range  ? Color, Urine YELLOW (A) YELLOW  ? APPearance HAZY (A) CLEAR  ? Specific Gravity, Urine >1.046 (H) 1.005 - 1.030  ? pH 6.0 5.0 - 8.0  ? Glucose, UA NEGATIVE NEGATIVE mg/dL  ? Hgb urine dipstick NEGATIVE NEGATIVE  ? Bilirubin Urine NEGATIVE NEGATIVE  ? Ketones, ur 5 (A) NEGATIVE mg/dL  ? Protein, ur NEGATIVE NEGATIVE mg/dL  ? Nitrite NEGATIVE NEGATIVE  ? Leukocytes,Ua NEGATIVE NEGATIVE  ?Ethanol  ?Result Value Ref Range  ? Alcohol, Ethyl (B) <10 <10 mg/dL  ?Urine Drug Screen, Qualitative  ?Result Value Ref Range  ? Tricyclic, Ur Screen NONE DETECTED NONE DETECTED  ? Amphetamines, Ur Screen NONE DETECTED NONE DETECTED  ? MDMA (Ecstasy)Ur Screen NONE DETECTED NONE DETECTED  ? Cocaine Metabolite,Ur [place] NONE DETECTED NONE DETECTED  ? Opiate, Ur Screen NONE DETECTED NONE DETECTED  ? Phencyclidine (PCP) Ur S NONE DETECTED NONE DETECTED  ? Cannabinoid 50 Ng, Ur [place] NONE DETECTED NONE DETECTED  ? Barbiturates, Ur Screen NONE DETECTED NONE DETECTED  ? Benzodiazepine, Ur Scrn NONE DETECTED NONE DETECTED  ? Methadone Scn, Ur NONE DETECTED NONE DETECTED  ?Lipid panel  ?Result Value Ref Range  ? Cholesterol 195 0 - 200 mg/dL  ? Triglycerides 123 <150 mg/dL  ? HDL 87 >40 mg/dL  ? Total CHOL/HDL Ratio 2.2 RATIO  ? VLDL 25 0 - 40 mg/dL  ? LDL Cholesterol 83 0 - 99 mg/dL  ?Hemoglobin A1c  ?Result Value Ref Range  ? Hgb A1c MFr Bld 5.6 4.8 - 5.6 %  ? Mean Plasma Glucose 114.02 mg/dL  ?Troponin I (High Sensitivity)  ?Result Value Ref Range  ? Troponin I (High Sensitivity) 10 <18 ng/L  ? ?I personally reviewed labs ? ?Imaging: ?I personally  reviewed radiology studies to include: ? ?CT head 05/20/21 ?IMPRESSION: ?1. Multi compartment acute intracranial hemorrhage. ?2. Multiple foci of intraparenchymal hemorrhage, largest in the ?right temporal lobe measuring 2.1

## 2021-05-21 NOTE — ED Notes (Signed)
Pt resting comfortably at this time. Normal rise and fall of chest. Pt does appear to be confused at this time. Pt is in soft restraints at this time due to pt continually interfering with monitoring and IV's. Pt denies any needs at this time. Call bell in reach.  ?

## 2021-05-21 NOTE — Plan of Care (Signed)
Continuing with plan of care. 

## 2021-05-21 NOTE — ED Notes (Signed)
Breakfast tray delivered, pt not safe to eat at this time due to not being able to follow simple commands.  ?

## 2021-05-21 NOTE — ED Notes (Signed)
Brother speaking to MRI at this time  ?

## 2021-05-21 NOTE — Progress Notes (Addendum)
? ? ? ?Progress Note  ? ? ?[name]  [id number] DOB: April 01, 1950  DOA: 05/20/2021 ?PCP: Leonides Sake, MD  ? ? ? ? ?Brief Narrative:  ? ? ?Medical records reviewed and are as summarized below: ? ?[name] is a 71 y.o. male with medical history significant for hypertension, hyperlipidemia, CAD, recently hospitalized about 2 months prior to admission from 01/30/2021 through 02/03/2021 for severe sepsis secondary to UTI and a fall, cognitive impairment, severe malnutrition.  He was brought to the hospital because of altered mental status.  Reportedly, he was found down at home covered with urine and feces. ? ?He was found to have intracranial hemorrhage (intraparenchymal and subdural hemorrhage) and fracture of right inferior orbital wall ? ? ? ? ?Assessment/Plan:  ? ?Principal Problem: ?  Fall at home, initial encounter ?Active Problems: ?  Intraparenchymal hemorrhage of brain ([place]) ?  Subdural hemorrhage ([place]) ?  Sinusitis, chronic ?  Hematoma of frontal scalp ?  Fracture of inferior orbital wall, right ([place]) ?  Coronary artery disease involving native coronary artery of native heart without angina pectoris ?  AAA (abdominal aortic aneurysm) without rupture ([place]) ?  Cognitive deficits ?  Essential hypertension ?  Periorbital contusion, left, initial encounter ? ? ? ?Body mass index is 17.22 kg/m?. ? ?S/p fall, Intracranial hemorrhage (subdural hemorrhage, intraparenchymal hemorrhage, subarachnoid hemorrhage), acute metabolic encephalopathy: Monitor closely in the stepdown unit.  Continue frequent neurochecks.  Maintain systolic BP less than [id number].  MRI brain is pending.  Continue Keppra for seizure prophylaxis.  Appreciate input from neurologist and neurosurgeon. ? ?Right inferior orbital wall fracture: Case discussed with Dr. George Ina.  Outpatient follow-up with ophthalmologist recommended. ? ?Chronic sinusitis, hemosinus: Consulted Dr. Pryor Ochoa, otolaryngologist, to assist with management. ? ?AAA  without rupture: This was 4.4 cm on CT on 01/30/2021.  Outpatient follow-up with vascular surgeon/PCP recommended ? ?Cognitively impaired and lives alone at home: He will likely need placement to SNF ? ?Other comorbidities include hypertension, CAD ? ?Diet Order   ? ?       ?  Diet NPO time specified  Diet effective now       ?  ? ?  ?  ? ?  ? ? ? ? ? ? ? ? ?Consultants: ?Neurologist ?Neurosurgeon ? ?Procedures: ?None ? ? ? ?Medications:  ? ?  stroke: early stages of recovery book   Does not apply Once  ? [START ON 05/22/2021] Chlorhexidine Gluconate Cloth  6 each Topical Q0600  ? risperiDONE  0.5 mg Oral Daily  ? ?Continuous Infusions: ? sodium chloride 75 mL/hr at 05/21/21 1500  ? levETIRAcetam Stopped (05/21/21 0806)  ? ? ? ?Anti-infectives (From admission, onward)  ? ? None  ? ?  ? ? ? ? ? ? ? ? ? ?Family Communication/Anticipated D/C date and plan/Code Status  ? ?DVT prophylaxis: SCD's Start: 05/21/21 0108 ? ? ?  Code Status: Full Code ? ?Family Communication: Plan discussed with [name], daughter (lives in [place]), over the phone. ?Disposition Plan: To be determined ? ? ?Status is: Inpatient ?Remains inpatient appropriate because: Intracranial hemorrhage ? ? ? ? ? ? ?Subjective:  ? ?Interval events noted.  He is unable to provide any history because of confusion drowsiness ? ?Objective:  ? ? ?Vitals:  ? 05/21/21 1200 05/21/21 1300 05/21/21 1400 05/21/21 1500  ?BP: 137/83 (!) 144/77 139/79 (!) 169/119  ?Pulse: 92 85 79 (!) 107  ?Resp: 14 14 15 20   ?Temp:      ?  TempSrc:      ?SpO2: 97% 99% 99% 99%  ?Weight:      ?Height:      ? ?No data found. ? ? ?Intake/Output Summary (Last 24 hours) at 05/21/2021 1618 ?Last data filed at 05/21/2021 1500 ?Gross per 24 hour  ?Intake 2140.01 ml  ?Output 1 ml  ?Net 2139.01 ml  ? ?Filed Weights  ? 05/20/21 1606  ?Weight: 54.4 kg  ? ? ?Exam: ? ?GEN: NAD ?SKIN: Warm and dry ?EYES: EOMI.  Left periorbital ecchymosis ?ENT: MMM ?CV: RRR ?PULM: CTA B ?ABD: soft, ND, NT, +BS ?CNS:  Drowsy/sedated. ?EXT: No edema or tenderness ? ? ? ?  ? ? ?Data Reviewed:  ? ?I have personally reviewed following labs and imaging studies: ? ?Labs: ?Labs show the following:  ? ?Basic Metabolic Panel: ?Recent Labs  ?Lab 05/20/21 ?1616  ?NA 137  ?K 3.2*  ?CL 98  ?CO2 28  ?GLUCOSE 154*  ?BUN 16  ?CREATININE 0.58*  ?CALCIUM 9.8  ? ?GFR ?Estimated Creatinine Clearance: 64.2 mL/min (A) (by C-G formula based on SCr of 0.58 mg/dL (L)). ?Liver Function Tests: ?Recent Labs  ?Lab 05/20/21 ?1616  ?AST 50*  ?ALT 32  ?ALKPHOS 92  ?BILITOT 0.8  ?PROT 8.4*  ?ALBUMIN 4.2  ? ?No results for input(s): LIPASE, AMYLASE in the last 168 hours. ?No results for input(s): AMMONIA in the last 168 hours. ?Coagulation profile ?No results for input(s): INR, PROTIME in the last 168 hours. ? ?CBC: ?Recent Labs  ?Lab 05/20/21 ?1616  ?WBC 13.5*  ?NEUTROABS 10.5*  ?HGB 12.6*  ?HCT 37.5*  ?MCV 97.7  ?PLT 203  ? ?Cardiac Enzymes: ?Recent Labs  ?Lab 05/20/21 ?1616  ?CKTOTAL 94  ? ?BNP (last 3 results) ?No results for input(s): PROBNP in the last 8760 hours. ?CBG: ?Recent Labs  ?Lab 05/21/21 ?1130  ?GLUCAP 98  ? ?D-Dimer: ?No results for input(s): DDIMER in the last 72 hours. ?Hgb A1c: ?Recent Labs  ?  05/20/21 ?1610  ?HGBA1C 5.6  ? ?Lipid Profile: ?Recent Labs  ?  05/20/21 ?1610  ?CHOL 195  ?HDL 87  ?[place] 83  ?TRIG 123  ?CHOLHDL 2.2  ? ?Thyroid function studies: ?No results for input(s): TSH, T4TOTAL, T3FREE, THYROIDAB in the last 72 hours. ? ?Invalid input(s): FREET3 ?Anemia work up: ?No results for input(s): VITAMINB12, FOLATE, FERRITIN, TIBC, IRON, RETICCTPCT in the last 72 hours. ?Sepsis Labs: ?Recent Labs  ?Lab 05/20/21 ?1610 05/20/21 ?1616 05/21/21 ?1209  ?WBC  --  13.5*  --   ?LATICACIDVEN 2.7*  --  1.0  ? ? ?Microbiology ?Recent Results (from the past 240 hour(s))  ?Resp Panel by RT-PCR (Flu A&B, Covid) Nasopharyngeal Swab     Status: None  ? Collection Time: 05/20/21  7:24 PM  ? Specimen: Nasopharyngeal Swab; Nasopharyngeal(NP) swabs in  vial transport medium  ?Result Value Ref Range Status  ? SARS Coronavirus 2 by RT PCR NEGATIVE NEGATIVE Final  ?  Comment: (NOTE) ?SARS-CoV-2 target nucleic acids are NOT DETECTED. ? ?The SARS-CoV-2 RNA is generally detectable in upper respiratory ?specimens during the acute phase of infection. The lowest ?concentration of SARS-CoV-2 viral copies this assay can detect is ?138 copies/mL. A negative result does not preclude SARS-Cov-2 ?infection and should not be used as the sole basis for treatment or ?other patient management decisions. A negative result may occur with  ?improper specimen collection/handling, submission of specimen other ?than nasopharyngeal swab, presence of viral mutation(s) within the ?areas targeted by this assay, and inadequate number of viral ?  copies(<138 copies/mL). A negative result must be combined with ?clinical observations, patient history, and epidemiological ?information. The expected result is Negative. ? ?Fact Sheet for Patients:  ?EntrepreneurPulse.com.au ? ?Fact Sheet for Healthcare Providers:  ?IncredibleEmployment.be ? ?This test is no t yet approved or cleared by the [place] FDA and  ?has been authorized for detection and/or diagnosis of SARS-CoV-2 by ?FDA under an Emergency Use Authorization (EUA). This EUA will remain  ?in effect (meaning this test can be used) for the duration of the ?COVID-19 declaration under Section 564(b)(1) of the Act, 21 ?U.S.C.section 360bbb-3(b)(1), unless the authorization is terminated  ?or revoked sooner.  ? ? ?  ? Influenza A by PCR NEGATIVE NEGATIVE Final  ? Influenza B by PCR NEGATIVE NEGATIVE Final  ?  Comment: (NOTE) ?The Xpert Xpress SARS-CoV-2/FLU/RSV plus assay is intended as an aid ?in the diagnosis of influenza from Nasopharyngeal swab specimens and ?should not be used as a sole basis for treatment. Nasal washings and ?aspirates are unacceptable for Xpert Xpress SARS-CoV-2/FLU/RSV ?testing. ? ?Fact  Sheet for Patients: ?EntrepreneurPulse.com.au ? ?Fact Sheet for Healthcare Providers: ?IncredibleEmployment.be ? ?This test is not yet approved or cleared by the [employer]

## 2021-05-21 NOTE — Progress Notes (Signed)
SLP Cancellation Note ? ?Patient Details ?Name: [name] ?MRN: [id number] ?DOB: 19-Mar-1950 ? ? ?Cancelled treatment:       Reason Eval/Treat Not Completed: Medical issues which prohibited therapy;Patient not medically ready (chart reviewed; consulted NSG re: pt's status. Just arrived to CCU from the ED.) ?Pt is pending an MRI to further assess "multifocal intracranial hemorrhages with multiple intraparenchymal hemorrhages and subarachnoid hemorrhage."; Neurology and Neurosurgery following per chart.  ?Pt's BP is elevated; NSG stated pt is now calm and would prefer him not agitated. He has been awake, verbal, and oriented x2. He is NPO at this time including meds per NSG.  ?ST services will f/u in the morning w/ assessment. NSG agreed. Recommend frequent oral care for hygiene and stimulation of swallowing. NSG updated.  ? ? ? ? ?Jerilynn Som, MS, CCC-SLP ?Speech Language Pathologist ?Rehab Services; [facility] ?[phone] (ascom) ?Corrado Hymon ?05/21/2021, 3:46 PM ?

## 2021-05-21 NOTE — ED Notes (Signed)
Pt breif and chux changed at this time ?

## 2021-05-21 NOTE — ED Notes (Signed)
Pt in bed, attempting to take off monitoring, and moving to pull at his IV lines.  Pt redirected with difficulty to not pull at his monitoring and IV lines.   ?

## 2021-05-21 NOTE — ED Notes (Signed)
Pt laying in bed, supine at this time with eyes closed.  Soft restraints remain in good position.  Pt not voicing any needs at this time.   ?

## 2021-05-22 DIAGNOSIS — Y92009 Unspecified place in unspecified non-institutional (private) residence as the place of occurrence of the external cause: Secondary | ICD-10-CM | POA: Diagnosis not present

## 2021-05-22 DIAGNOSIS — I639 Cerebral infarction, unspecified: Secondary | ICD-10-CM | POA: Diagnosis present

## 2021-05-22 DIAGNOSIS — W19XXXA Unspecified fall, initial encounter: Secondary | ICD-10-CM | POA: Diagnosis not present

## 2021-05-22 DIAGNOSIS — I619 Nontraumatic intracerebral hemorrhage, unspecified: Secondary | ICD-10-CM | POA: Diagnosis not present

## 2021-05-22 DIAGNOSIS — R4189 Other symptoms and signs involving cognitive functions and awareness: Secondary | ICD-10-CM | POA: Diagnosis not present

## 2021-05-22 DIAGNOSIS — I629 Nontraumatic intracranial hemorrhage, unspecified: Secondary | ICD-10-CM | POA: Diagnosis not present

## 2021-05-22 DIAGNOSIS — I6310 Cerebral infarction due to embolism of unspecified precerebral artery: Secondary | ICD-10-CM | POA: Diagnosis not present

## 2021-05-22 LAB — GLUCOSE, CAPILLARY
Glucose-Capillary: 82 mg/dL (ref 70–99)
Glucose-Capillary: 93 mg/dL (ref 70–99)
Glucose-Capillary: 136 mg/dL — ABNORMAL HIGH (ref 70–99)
Glucose-Capillary: 130 mg/dL — ABNORMAL HIGH (ref 70–99)
Glucose-Capillary: 171 mg/dL — ABNORMAL HIGH (ref 70–99)

## 2021-05-22 LAB — CBC
HCT: 34.4 % — ABNORMAL LOW (ref 39.0–52.0)
Hemoglobin: 11.5 g/dL — ABNORMAL LOW (ref 13.0–17.0)
MCH: 31.9 pg (ref 26.0–34.0)
MCHC: 33.4 g/dL (ref 30.0–36.0)
MCV: 95.3 fL (ref 80.0–100.0)
Platelets: 174 10*3/uL (ref 150–400)
RBC: 3.61 MIL/uL — ABNORMAL LOW (ref 4.22–5.81)
RDW: 12.9 % (ref 11.5–15.5)
WBC: 8.5 10*3/uL (ref 4.0–10.5)
nRBC: 0 % (ref 0.0–0.2)

## 2021-05-22 LAB — BASIC METABOLIC PANEL
Anion gap: 9 (ref 5–15)
BUN: 7 mg/dL — ABNORMAL LOW (ref 8–23)
CO2: 23 mmol/L (ref 22–32)
Calcium: 8.9 mg/dL (ref 8.9–10.3)
Chloride: 102 mmol/L (ref 98–111)
Creatinine, Ser: 0.6 mg/dL — ABNORMAL LOW (ref 0.61–1.24)
GFR, Estimated: >60 mL/min (ref >60)
Glucose, Bld: 107 mg/dL — ABNORMAL HIGH (ref 70–99)
Potassium: 3.8 mmol/L (ref 3.5–5.1)
Sodium: 134 mmol/L — ABNORMAL LOW (ref 135–145)

## 2021-05-22 LAB — MAGNESIUM: Magnesium: 1.9 mg/dL (ref 1.7–2.4)

## 2021-05-22 LAB — PHOSPHORUS: Phosphorus: 2.5 mg/dL (ref 2.5–4.6)

## 2021-05-22 MED ORDER — KCL IN DEXTROSE-NACL 40-5-0.9 MEQ/L-%-% IV SOLN
INTRAVENOUS | Status: DC
  Filled 2021-05-22: qty 1000

## 2021-05-22 MED ORDER — POTASSIUM CHLORIDE 10 MEQ/100ML IV SOLN
10.0000 meq | INTRAVENOUS | Status: AC
  Administered 2021-05-22 (×6): 10 meq via INTRAVENOUS
  Filled 2021-05-22 (×7): qty 100

## 2021-05-22 MED ORDER — MIDODRINE HCL 5 MG PO TABS: 2.5000 mg | ORAL_TABLET | Freq: Two times a day (BID) | ORAL | Status: DC

## 2021-05-22 MED ORDER — ENSURE ENLIVE PO LIQD
237.0000 mL | Freq: Two times a day (BID) | ORAL | Status: DC
  Administered 2021-05-22 – 2021-05-25 (×6): 237 mL via ORAL

## 2021-05-22 MED ORDER — AMOXICILLIN-POT CLAVULANATE 875-125 MG PO TABS
1.0000 | ORAL_TABLET | Freq: Two times a day (BID) | ORAL | Status: DC
  Administered 2021-05-22 – 2021-05-25 (×8): 1 via ORAL
  Filled 2021-05-22 (×9): qty 1

## 2021-05-22 MED ORDER — SODIUM CHLORIDE 0.9 % IV SOLN
3.0000 g | Freq: Four times a day (QID) | INTRAVENOUS | Status: DC
  Filled 2021-05-22 (×2): qty 8

## 2021-05-22 NOTE — Progress Notes (Signed)
Inpatient Rehab Admissions Coordinator:  ? ?Per therapy recommendations,  patient was screened for CIR candidacy by Clements Toro, MS, CCC-SLP. At this time, Pt. Appears to be a a potential candidate for CIR. I will place   order for rehab consult per protocol for full assessment. Please contact me any with questions. ? ?Regis Wiland, MS, CCC-SLP ?Rehab Admissions Coordinator  ?[phone] (celll) ?[phone] (office) ? ?

## 2021-05-22 NOTE — Consult Note (Signed)
[name], [name] ?[id number] ?Dec 28, 1950 ?Jennye Boroughs, MD ? ?Reason for Consult: Orbital fracture ? ?HPI: 71 y.o. male admitted after being found down by his family.  Blood on floor noted as well as left facial bruising.  Found to have subdural hematoma that is being monitored by Neurosurgery.  Patient also found to have extensive sinus disease and findings consisted with prior surgery. ? ?Allergies: No Known Allergies ? ?ROS: Review of systems normal other than 12 systems except per HPI. ? ?PMH:  ?Past Medical History:  ?Diagnosis Date  ? AAA (abdominal aortic aneurysm) without rupture ([place]) 04/29/2021  ? CT in Jan 2023: 4.4. infrarenal abdominal aortic aneurysm   ? Cerebrovascular disease   ? Coronary artery disease   ? Dyslipidemia   ? Hypertension   ? Pain in joint, shoulder region   ? ? ?FH:  ?Family History  ?Problem Relation Age of Onset  ? Alzheimer's disease Mother   ? Heart disease Father   ? ? ?SH:  ?Social History  ? ?Socioeconomic History  ? Marital status: Widowed  ?  Spouse name: Not on file  ? Number of children: Not on file  ? Years of education: Not on file  ? Highest education level: Not on file  ?Occupational History  ? Occupation: Engineer, drilling  ?  Comment: [employer]  ?Tobacco Use  ? Smoking status: Every Day  ?  Types: E-cigarettes  ? Smokeless tobacco: Never  ? Tobacco comments:  ?  Smokes electronic ciggarettes  ?Substance and Sexual Activity  ? Alcohol use: Yes  ?  Alcohol/week: 21.0 standard drinks  ?  Types: 21 Shots of liquor per week  ? Drug use: Never  ? Sexual activity: Not on file  ?Other Topics Concern  ? Not on file  ?Social History Narrative  ? Not on file  ? ?Social Determinants of Health  ? ?Financial Resource Strain: Not on file  ?Food Insecurity: Not on file  ?Transportation Needs: Not on file  ?Physical Activity: Not on file  ?Stress: Not on file  ?Social Connections: Not on file  ?Intimate Partner Violence: Not on file  ? ? ?PSH:  ?Past Surgical History:  ?Procedure Laterality  Date  ? CAROTID ENDARTERECTOMY    ? left/Jan. 2006/ right;Dacron patch angioplasty  ? PTCA    ? stent placement January 2006  ? ? ?Physical  Exam:  ?GEN-  responds to name by opening eyes ?EARS-  clear bilaterally ?NOSE-  no significant mass or lesion, dried blood present but no active bleeding ?OC/OP-  limited exam due to cooperation but no bleeding noted ?NECK- supple with no LAD ?FACE-  left periorbital ecchymosis, left forehead soft tissue mass with evidence of prior surgery.  Dried blood of left parietal area but no laceration noted in surrounding area ? ?CT-  Right age indeterminate orbital floor fracture, left infraorbital soft tissue edema, left frontal scalp soft tissue mass (not hematoma), chronic sinusitis with post-surgical changed to left maxillary.  Opacification of left sphenoid with significant osteosclerotic change ? ?A/P: Right orbital floor fracture with blood in maxillary sinus, chronic sinus disease ? ?Plan:  Ecchymosis is on left side yet fracture is on right.  Given presence of blood in maxillary sinus agree that this is most likely acute but odd presentation.  No surgical intervention is needed at this time.  Can follow up as outpatient for chronic sinus disease depending on recovery from neurological issues.  Would recommend prophylactic antibiotic coverage for sinus disease given presence of blood in  right maxillary sinus.   ? ?45 min of critical care time was spent on evaluation and review of patient's scans and previous medication record. ? ? ?Natacia Chaisson ?05/22/2021 ?12:01 PM ? ? ? ?

## 2021-05-22 NOTE — Progress Notes (Addendum)
? ? ? ?Progress Note  ? ? ?[name]  EXB:[id number] DOB: October 20, 1950  DOA: 05/20/2021 ?PCP: Ailene RavelHamrick, Maura L, MD  ? ? ? ? ?Brief Narrative:  ? ? ?Medical records reviewed and are as summarized below: ? ?Omar BellsMichael R [name] is a 71 y.o. male with medical history significant for hypertension, hyperlipidemia, CAD, recently hospitalized about 2 months prior to admission from 01/30/2021 through 02/03/2021 for severe sepsis secondary to UTI and a fall, cognitive impairment, severe malnutrition.  He was brought to the hospital because of altered mental status.  Reportedly, he was found down at home covered with urine and feces. ? ?He was found to have intracranial hemorrhage (intraparenchymal and subdural hemorrhage) and fracture of right inferior orbital wall ? ? ? ? ?Assessment/Plan:  ? ?Principal Problem: ?  Fall at home, initial encounter ?Active Problems: ?  Intraparenchymal hemorrhage of brain ([facility]) ?  Subdural hemorrhage ([facility]) ?  Sinusitis, chronic ?  Hematoma of frontal scalp ?  Fracture of inferior orbital wall, right ([facility]) ?  Coronary artery disease involving native coronary artery of native heart without angina pectoris ?  AAA (abdominal aortic aneurysm) without rupture ([facility]) ?  Cognitive deficits ?  Essential hypertension ?  Periorbital contusion, left, initial encounter ?  Embolic stroke ([facility]) ? ? ? ?Body mass index is 17.22 kg/m?. ? ?S/p fall, Intracranial hemorrhage (subdural hemorrhage, intraparenchymal hemorrhage, subarachnoid hemorrhage),, probable embolic infarcts/stroke, acute metabolic encephalopathy:  ?MRI brain showed small acute infarct involving the anterior and posterior left frontal lobe, left body of the corpus callosum and right posterior pons/cerebellar peduncle.  ?Continue Keppra for seizure prophylaxis.  Follow-up with neurologist and neurosurgeon.  PT and OT recommended further rehabilitation at inpatient rehab. ? ?Orthostatic hypotension: Repeat orthostatic vitals tomorrow. ?(BP checked in  RUE: ?Sitting in recliner after gait: 82/39 mmHg MAP 52 ?Supine in recliner: 98/70 mmHg MAP 76 ?Sitting upright in recliner: 101/63 mmHg MAP 75 ?Sitting upright in chair at end of session: 90/62 mmHg MAP 71 (after standing additional time) ? ?Right inferior orbital wall fracture: Case discussed with Dr. Druscilla BrowniePorfilio.  Outpatient follow-up with ophthalmologist recommended. ? ?Chronic sinusitis, hemosinus: Start prophylactic antibiotic as recommended by Dr. Andee PolesVaught.  He has been started on Augmentin.  Outpatient follow-up with ENT physician recommended ? ?Hypokalemia: Improved ? ?Dysphagia: Speech therapist recommended dysphagia 1 diet. ? ?AAA without rupture: This was 4.4 cm on CT on 01/30/2021.  Outpatient follow-up with vascular surgeon/PCP recommended ? ?Cognitively impaired and lives alone at home: He will likely need placement to SNF ? ?Other comorbidities include hypertension, CAD ? ?Diet Order   ? ?       ?  DIET - DYS 1 Room service appropriate? Yes with Assist; Fluid consistency: Thin  Diet effective now       ?  ? ?  ?  ? ?  ? ? ? ? ? ? ? ? ?Consultants: ?Neurologist ?Neurosurgeon ? ?Procedures: ?None ? ? ? ?Medications:  ? ?  stroke: early stages of recovery book   Does not apply Once  ? amoxicillin-clavulanate  1 tablet Oral Q12H  ? Chlorhexidine Gluconate Cloth  6 each Topical Q0600  ? feeding supplement  237 mL Oral BID BM  ? ?Continuous Infusions: ? levETIRAcetam 500 mg (05/22/21 [id number])  ? ? ? ?Anti-infectives (From admission, onward)  ? ? Start     Dose/Rate Route Frequency Ordered Stop  ? 05/22/21 1015  amoxicillin-clavulanate (AUGMENTIN) 875-125 MG per tablet 1 tablet       ?  1 tablet Oral Every 12 hours 05/22/21 0920 05/29/21 0959  ? 05/22/21 1000  Ampicillin-Sulbactam (UNASYN) 3 g in sodium chloride 0.9 % 100 mL IVPB  Status:  Discontinued       ? 3 g ?200 mL/hr over 30 Minutes Intravenous Every 6 hours 05/22/21 0851 05/22/21 0920  ? ?  ? ? ? ? ? ? ? ? ? ?Family Communication/Anticipated D/C date and  plan/Code Status  ? ?DVT prophylaxis: SCD's Start: 05/21/21 0108 ? ? ?  Code Status: Full Code ? ?Family Communication: Plan discussed with Iantha Fallen, his brother,. Att he bedside ?Disposition Plan: To be determined ? ? ?Status is: Inpatient ?Remains inpatient appropriate because: Intracranial hemorrhage ? ? ? ? ? ? ?Subjective:  ? ?Interval events noted.  He is more awake and alert and communicative.  No complaints.  His brother was at the bedside.  Speech therapist was also at the bedside.  Patient was feeding himself with his left hand ? ?Objective:  ? ? ?Vitals:  ? 05/22/21 1300 05/22/21 1400 05/22/21 1500 05/22/21 1600  ?BP: (!) 117/59 100/72  (!) 117/92  ?Pulse: (!) 115 (!) 110    ?Resp: (!) 25 20 13 17   ?Temp:      ?TempSrc:      ?SpO2: 96% 96%    ?Weight:      ?Height:      ? ?No data found. ? ? ?Intake/Output Summary (Last 24 hours) at 05/22/2021 1644 ?Last data filed at 05/22/2021 0600 ?Gross per 24 hour  ?Intake 1423.64 ml  ?Output 950 ml  ?Net 473.64 ml  ? ?Filed Weights  ? 05/20/21 1606  ?Weight: 54.4 kg  ? ? ?Exam: ? ?GEN: NAD ?SKIN: No rash ?EYES: EOMI, left periorbital ecchymosis ?ENT: MMM ?CV: RRR ?PULM: CTA B ?ABD: soft, ND, NT, +BS ?CNS: AAO x 1 (person), non focal ?EXT: No edema or tenderness ? ? ? ?  ? ? ?Data Reviewed:  ? ?I have personally reviewed following labs and imaging studies: ? ?Labs: ?Labs show the following:  ? ?Basic Metabolic Panel: ?Recent Labs  ?Lab 05/20/21 ?1616 05/21/21 ?2315 05/22/21 ?05/24/21  ?NA 137 135 134*  ?K 3.2* 3.2* 3.8  ?CL 98 104 102  ?CO2 28 24 23   ?GLUCOSE 154* 96 107*  ?BUN 16 7* 7*  ?CREATININE 0.58* 0.53* 0.60*  ?CALCIUM 9.8 8.7* 8.9  ?MG  --  1.9 1.9  ?PHOS  --  2.7 2.5  ? ?GFR ?Estimated Creatinine Clearance: 64.2 mL/min (A) (by C-G formula based on SCr of 0.6 mg/dL (L)). ?Liver Function Tests: ?Recent Labs  ?Lab 05/20/21 ?1616  ?AST 50*  ?ALT 32  ?ALKPHOS 92  ?BILITOT 0.8  ?PROT 8.4*  ?ALBUMIN 4.2  ? ?No results for input(s): LIPASE, AMYLASE in the last 168  hours. ?No results for input(s): AMMONIA in the last 168 hours. ?Coagulation profile ?No results for input(s): INR, PROTIME in the last 168 hours. ? ?CBC: ?Recent Labs  ?Lab 05/20/21 ?1616 05/21/21 ?2315 05/22/21 ?05/23/21  ?WBC 13.5* 7.7 8.5  ?NEUTROABS 10.5*  --   --   ?HGB 12.6* 10.7* 11.5*  ?HCT 37.5* 31.9* 34.4*  ?MCV 97.7 95.2 95.3  ?PLT 203 155 174  ? ?Cardiac Enzymes: ?Recent Labs  ?Lab 05/20/21 ?1616  ?CKTOTAL 94  ? ?BNP (last 3 results) ?No results for input(s): PROBNP in the last 8760 hours. ?CBG: ?Recent Labs  ?Lab 05/21/21 ?2240 05/22/21 ?0335 05/22/21 ?0749 05/22/21 ?1200 05/22/21 ?1557  ?GLUCAP 98 82 93 136* 130*  ? ?  D-Dimer: ?No results for input(s): DDIMER in the last 72 hours. ?Hgb A1c: ?Recent Labs  ?  05/20/21 ?1610  ?HGBA1C 5.6  ? ?Lipid Profile: ?Recent Labs  ?  05/20/21 ?1610  ?CHOL 195  ?HDL 87  ?LDLCALC 83  ?TRIG 123  ?CHOLHDL 2.2  ? ?Thyroid function studies: ?No results for input(s): TSH, T4TOTAL, T3FREE, THYROIDAB in the last 72 hours. ? ?Invalid input(s): FREET3 ?Anemia work up: ?No results for input(s): VITAMINB12, FOLATE, FERRITIN, TIBC, IRON, RETICCTPCT in the last 72 hours. ?Sepsis Labs: ?Recent Labs  ?Lab 05/20/21 ?1610 05/20/21 ?1616 05/21/21 ?1209 05/21/21 ?2315 05/22/21 ?9604  ?WBC  --  13.5*  --  7.7 8.5  ?LATICACIDVEN 2.7*  --  1.0  --   --   ? ? ?Microbiology ?Recent Results (from the past 240 hour(s))  ?Resp Panel by RT-PCR (Flu A&B, Covid) Nasopharyngeal Swab     Status: None  ? Collection Time: 05/20/21  7:24 PM  ? Specimen: Nasopharyngeal Swab; Nasopharyngeal(NP) swabs in vial transport medium  ?Result Value Ref Range Status  ? SARS Coronavirus 2 by RT PCR NEGATIVE NEGATIVE Final  ?  Comment: (NOTE) ?SARS-CoV-2 target nucleic acids are NOT DETECTED. ? ?The SARS-CoV-2 RNA is generally detectable in upper respiratory ?specimens during the acute phase of infection. The lowest ?concentration of SARS-CoV-2 viral copies this assay can detect is ?138 copies/mL. A negative result does  not preclude SARS-Cov-2 ?infection and should not be used as the sole basis for treatment or ?other patient management decisions. A negative result may occur with  ?improper specimen collection/handling, submis

## 2021-05-22 NOTE — Progress Notes (Signed)
S: Patient seen at bedside, oriented to self, states year is 1395 and he is at the [employer]. He says he is not joking - unclear if he is. ? ?MRI brain wo contrast ? ?1. Evaluation is significantly limited by motion artifact. Within ?this limitation, there are small acute infarcts involving the ?anterior and posterior left frontal lobe, left body of the corpus ?callosum, and right posterior pons/cerebellar peduncle. ?  ?2. Overall unchanged volume and distribution of previously noted ?intraparenchymal, subdural, and subarachnoid hemorrhage, without ?significant midline shift. ?  ?3. Hemorrhage layering in the bilateral occipital horns and possibly ?in the dependent fourth ventricle, without evidence of ?hydrocephalus. ? ?Imaging personally reviewed: I agree with above interpretation ? ?O: ? ?Vitals:  ? 05/22/21 1700 05/22/21 1800  ?BP: (!) 122/107 (!) 145/96  ?Pulse:  (!) 122  ?Resp: (!) 26 (!) 21  ?Temp:    ?SpO2:  100%  ? ?Physical Exam ?Gen: alert, oriented to self, states year is 1395 and he is at the [facility] ?Head: L periorbital bruising ?Resp: CTAB, no w/r/r ?CV: RRR, no m/g/r; nml S1 and S2. 2+ symmetric peripheral pulses. ?  ?Neuro: ?*MS: alert, oriented to self, states year is 1395 and he is at the [facility], follows simple commands ?*Speech: mild dysarthria, no aphasia ?*CN: PERRL, EOMI, blinks to threat bilat, sensation intact, face symmetric, hearing intact to voice ?*Motor:   Normal bulk.  No tremor, rigidity or bradykinesia.  ?  ?  Strength: Dlt Bic Tri WrE WrF FgS Gr HF KnF KnE PlF DoF  ?  Left 4+ 5 4- 4 4 4 4 4 4  4+ 4 4  ?  Right 5 5 5 5 5 5 5 5 5 5 5 5   ?  ?  ?*Sensory: SILT. No double-simultaneous extinction.  ?*Coordination:  FNF intact bilat ?*Reflexes:  2+ and symmetric throughout without clonus; toes down-going bilat ?*Gait: deferred ?  ?ICH score = 1 ? ?A/P: 71 yo man with HTN, HL, CAD, suspected baseline cognitive impairment who lives alone, brought in by family for encephalopathy  and found to have extensive multicompartment hemorrhage (IPH, SAH, SDH, IVH) with evidence of head trauma. No vascular abnl identified on CTA. Head CT at 6 hrs was stable. He has multifocal ischemic infarcts on MRI brain, suggesting central embolic source. He is clearly not a candidate for anticoagulation at this time if he were found to have a fib. TTE pending. ? ?- Clevidipine or IV labetalol prn for goal SBP <150 ?- TTE ?- SCDs for DVT prophylaxis ?- Keppra 500mg  bid ?- HOB elevated 30 degrees ?- Frequent neurochecks per step-down protocol ?- STAT head CT for any change in exam ?- NSU following; neurology will continue to follow as well ?  ?  ? , MD ?[facility] ?[phone] ?  ?If 7pm- 7am, please page neurology on call as listed in AMION. ?

## 2021-05-22 NOTE — Evaluation (Signed)
Physical Therapy Evaluation ?Patient Details ?Name: [name] ?MRN: [id number] ?DOB: 1950-11-07 ?Today's Date: 05/22/2021 ? ?History of Present Illness ? Pt is 71 y/o M admitted on 05/20/21 after being found down at home covered with urine & feces. Pt was found to have ICH (intraparenchymal & subdural hemorrhage) & fx of R inferior orbital wall. PMH: HTN, HLD, CAD, recent hospitalization 01/30/21-02/03/21 for severe sepsis 2/2 UTI & fall, cognitive impairment & malnutrition, AAA  ?Clinical Impression ? Pt seen as co-tx with OT for pt & therapist's safety. Prior to admission pt was living alone with his brother providing groceries but pt otherwise independent without AD. Pt with hx of at least 2 falls in past 6 months, but possibly more. On this date, pt is oriented to self (name, DOB, but not age) and place (from choice of 2). Pt is restless and adjusting gown throughout session & is quick to return to sitting during functional tasks despite encouragement to remain standing. Pt is able to complete bed mobility with hospital bed features but no physical assistance and ambulate with min<>Mod assist +2 with HHA before therapist instructed pt to sit 2/2 elevated HR (HR up to 156 bpm with gait). Pt also noted to have low BP with positional changes & nurse notified. Will continue to follow pt acutely to address balance, activity tolerance, and gait. ?   ?BP checked in RUE: ?Sitting in recliner after gait: 82/39 mmHg MAP 52 ?Supine in recliner: 98/70 mmHg MAP 76 ?Sitting upright in recliner: 101/63 mmHg MAP 75 ?Sitting upright in chair at end of session: 90/62 mmHg MAP 71 (after standing additional time)  ? ?Recommendations for follow up therapy are one component of a multi-disciplinary discharge planning process, led by the attending physician.  Recommendations may be updated based on patient status, additional functional criteria and insurance authorization. ? ?Follow Up Recommendations Acute inpatient rehab  (3hours/day) ? ?  ?Assistance Recommended at Discharge Frequent or constant Supervision/Assistance  ?Patient can return home with the following ? A lot of help with walking and/or transfers;A lot of help with bathing/dressing/bathroom;Assistance with feeding;Assistance with cooking/housework;Direct supervision/assist for financial management;Assist for transportation;Direct supervision/assist for medications management;Help with stairs or ramp for entrance ? ?  ?Equipment Recommendations None recommended by PT  ?Recommendations for Other Services ? Rehab consult  ?  ?Functional Status Assessment Patient has had a recent decline in their functional status and demonstrates the ability to make significant improvements in function in a reasonable and predictable amount of time.  ? ?  ?Precautions / Restrictions Precautions ?Precautions: Fall ?Restrictions ?Weight Bearing Restrictions: No  ? ?  ? ?Mobility ? Bed Mobility ?Overal bed mobility: Needs Assistance ?Bed Mobility: Supine to Sit ?  ?  ?Supine to sit: Supervision, HOB elevated ?  ?  ?General bed mobility comments: Frequent cuing for step by step movements to come to sitting EOB, ultimately able to come to sitting EOB with supervision, bed rails & HOB fully elevated. ?  ? ?Transfers ?Overall transfer level: Needs assistance ?Equipment used: 2 person hand held assist ?Transfers: Sit to/from Stand ?Sit to Stand: Min assist, Mod assist ?  ?  ?  ?  ?  ?General transfer comment: cuing to initiate STS ?  ? ?Ambulation/Gait ?Ambulation/Gait assistance: Mod assist, Min assist, +2 physical assistance, +2 safety/equipment ?Gait Distance (Feet): 8 Feet ?Assistive device: 2 person hand held assist ?Gait Pattern/deviations: Decreased step length - left, Decreased step length - right, Decreased dorsiflexion - right, Decreased dorsiflexion - left, Decreased stride  length, Decreased weight shift to left, Decreased weight shift to right, Shuffle ?Gait velocity: decreased ?  ?  ?   ? ?Stairs ?  ?  ?  ?  ?  ? ?Wheelchair Mobility ?  ? ?Modified Rankin (Stroke Patients Only) ?  ? ?  ? ?Balance Overall balance assessment: Needs assistance, History of Falls ?Sitting-balance support: Feet supported, Bilateral upper extremity supported ?Sitting balance-Leahy Scale: Fair ?Sitting balance - Comments: close supervision static sitting ?  ?Standing balance support: Bilateral upper extremity supported, During functional activity ?Standing balance-Leahy Scale: Poor ?  ?  ?  ?  ?  ?  ?  ?  ?  ?  ?  ?  ?   ? ? ? ?Pertinent Vitals/Pain Pain Assessment ?Pain Assessment: Faces ?Faces Pain Scale: No hurt  ? ? ?Home Living Family/patient expects to be discharged to:: Private residence ?Living Arrangements: Alone ?Available Help at Discharge: Family;Available PRN/intermittently ?Type of Home: House ?Home Access: Level entry ?  ?  ?  ?Home Layout: One level ?Home Equipment: Agricultural consultant (2 wheels);Wheelchair - manual;Transport chair;Grab bars - tub/shower ?Additional Comments: Information taken from chart.  ?  ?Prior Function Prior Level of Function : Needs assist ?  ?  ?  ?  ?  ?  ?Mobility Comments: Pt's brother present reporting pt is ambulatory without AD though pt has a RW. Pt has had at least 2 falls in the past 6 months, possibly more but pt's brother is unsure. ?ADLs Comments: Pt's brother Iantha Fallen) provided pt with groceries but pt could prep meals on his own & performing bathing/dressing on his own. ?  ? ? ?Hand Dominance  ? Dominant Hand: Left ? ?  ?Extremity/Trunk Assessment  ? Upper Extremity Assessment ?Upper Extremity Assessment: Generalized weakness;Difficult to assess due to impaired cognition (bruising noted to BUE) ?  ? ?Lower Extremity Assessment ?Lower Extremity Assessment: Difficult to assess due to impaired cognition;Generalized weakness ?  ? ?   ?Communication  ? Communication: No difficulties (Pt doesn't verbalize much throughout session.)  ?Cognition Arousal/Alertness:  Awake/alert ?Behavior During Therapy: Flat affect ?Overall Cognitive Status: Impaired/Different from baseline ?Area of Impairment: Orientation, Attention, Memory, Following commands, Awareness, Problem solving, Safety/judgement, Rancho level ?  ?  ?  ?  ?  ?  ?  ?Rancho Levels of Cognitive Functioning ?Rancho Mirant Scales of Cognitive Functioning: Confused/inappropriate/non-agitated ?Orientation Level: Disoriented to, Situation, Time (able to state he's in a "hospital" when given choices) ?  ?Memory: Decreased short-term memory ?Following Commands: Follows one step commands consistently, Follows one step commands with increased time ?Safety/Judgement: Decreased awareness of deficits, Decreased awareness of safety ?Awareness: Intellectual ?Problem Solving: Slow processing, Decreased initiation, Difficulty sequencing, Requires verbal cues, Requires tactile cues ?  ?  ?Rancho Mirant Scales of Cognitive Functioning: Confused/inappropriate/non-agitated ? ?  ?General Comments   ? ?  ?Exercises    ? ?Assessment/Plan  ?  ?PT Assessment Patient needs continued PT services  ?PT Problem List Decreased strength;Decreased mobility;Decreased safety awareness;Decreased coordination;Decreased activity tolerance;Decreased cognition;Cardiopulmonary status limiting activity;Decreased balance;Decreased knowledge of use of DME ? ?   ?  ?PT Treatment Interventions DME instruction;Therapeutic activities;Cognitive remediation;Modalities;Therapeutic exercise;Gait training;Patient/family education;Stair training;Balance training;Neuromuscular re-education;Functional mobility training;Manual techniques   ? ?PT Goals (Current goals can be found in the Care Plan section)  ?Acute Rehab PT Goals ?Patient Stated Goal: none stated ?PT Goal Formulation: Patient unable to participate in goal setting ?Time For Goal Achievement: 06/05/21 ?Potential to Achieve Goals: Fair ? ?  ?Frequency 7X/week ?  ? ? ?  Co-evaluation PT/OT/SLP  Co-Evaluation/Treatment: Yes ?Reason for Co-Treatment: Necessary to address cognition/behavior during functional activity;For patient/therapist safety;Complexity of the patient's impairments (multi-system involvement) ?PT goals addressed during se

## 2021-05-22 NOTE — Evaluation (Addendum)
Clinical/Bedside Swallow Evaluation ?Patient Details  ?Name: [name] ?MRN: [id number] ?Date of Birth: [birth year] ? ?Today's Date: 05/22/2021 ?Time: SLP Start Time (ACUTE ONLY): 0830 SLP Stop Time (ACUTE ONLY): 0920 ?SLP Time Calculation (min) (ACUTE ONLY): 50 min ? ?Past Medical History:  ?Past Medical History:  ?Diagnosis Date  ? AAA (abdominal aortic aneurysm) without rupture (HCC) 04/29/2021  ? CT in Jan 2023: 4.4. infrarenal abdominal aortic aneurysm   ? Cerebrovascular disease   ? Coronary artery disease   ? Dyslipidemia   ? Hypertension   ? Pain in joint, shoulder region   ? ?Past Surgical History:  ?Past Surgical History:  ?Procedure Laterality Date  ? CAROTID ENDARTERECTOMY    ? left/Jan. 2006/ right;Dacron patch angioplasty  ? PTCA    ? stent placement January 2006  ? ?HPI:  ?Pt is a 71 y.o. male from [place] [address] with a history of HTN, HLD, CAD s/p stent placement, CVA and recent hospitalization for altered mental status found to have acute cystitis.  Pt lives alone, hospitalized 2 months ago from 1/7 to 02/03/2021 with severe sepsis secondary to UTI.  He presented with a fall, found to have severe malnutrition and likely cognitive impairment who was brought to the ED today after was found  in the home with altered mental status, covered with urine and feces by family members.  Last known normal was several days prior.  There was some noted blood on the floor and patient had some bruising around his left eye.  ED course: On arrival, tachycardic to 114 and slightly hypothermic at 97.4.  Labs with leukocytosis of 13,000 and hemoglobin 12.6.  Normal CK.  Potassium 3.2, creatinine 0.58, AST/ALT 50/32, EtOH less than 10, troponin 10, COVID and flu negative.  MRI revealed: extensive multicompartment hemorrhage (IPH, SAH, SDH, IVH) with evidence of head trauma. No vascular abnl identified on CTA. Head CT at 6 hrs was stable(per Neurology note).  ?  ?Assessment / Plan / Recommendation  ?Clinical  Impression ?  Pt seen for BSE this morning. He was awake/alert resting in bed. Brother present. He verbally responded to direct questions w/ basic responses; 1-2 words w/ somewhat mumbled speech; A/O x2(person, place). He also new the city of his residence. He followed 1-step instructions needing an intermittent cue; spontaneously followed through w/ known tasks such as opening bottle, pouring drink into cup, positioning straw, self-feeding switching utensils, and wiping mouth w/ washcloth.  ? ?Pt appears to present w/ adequate pharyngeal phase swallow function w/ No pharyngeal phase dysphagia noted w/ trials given, No neuromuscular impact on pharyngeal swallowing noted. Pt consumed po trials w/ No overt, clinical s/s of aspiration during intake. Pt did exhibit MILD oral phase deficits during swallowing w/ both liquid and puree consistencies c/b min reduced awareness of bolus (min, intermittent) w/ min prolonged oral transit (~8-12 seconds intermittently). As he continued po tasks, the prolonged A-P transfer time w/ bolus trials decreased.  ?Pt is at increased risk for aspiration/aspiration pneumonia in setting of Neurological presentation post, Mild oral phase dysphagia, and lacking sufficient upper Dentition. The risk can be reduced by following general aspiration precautions, Supervision during oral intake, and a modified diet consistency.  ? ?During po trials, pt consumed consistencies given w/ no overt coughing, decline in vocal quality, or change in respiratory presentation during/post trials. O2 sats remained 97%. Oral phase appeared grossly [facility] w/ ?bolus management and control of bolus propulsion for A-P transfer for swallowing by the end of all po trials --  pt was feeding himself w/ setup support. Oral clearing achieved w/ all trial consistencies. OM Exam [facility] w/ no overt unilateral weakness noted; min reduced protrusion strength/attention BUT strong posterior lingual bunching strength. Pt  fed self w/ setup support.  ? ?Recommend initiation of a Dysphagia level 1 (puree) diet w/ gravies to moisten foods; Thin liquids - monitor for any impulsive drinking behaviors. Recommend aspiration precautions, Pills Crushed vs Whole in Puree for safer, easier swallowing. Education given on Pills in Puree; food consistency; aspiration precautions; support and Supervision during meals to both pt/Brother and NSG. ST services will f/u w/ trials to upgrade to possible Minced consistency diet when appropriate. Dietician updated. MD updated. NSG agreed ?SLP Visit Diagnosis: Dysphagia, unspecified (R13.10) (impact of Neuro status) ?   ?Aspiration Risk ? Mild aspiration risk;Risk for inadequate nutrition/hydration (reduced following precautions)  ?  ?Diet Recommendation   Dysphagia level 1 (puree) diet w/ gravies to moisten foods; Thin liquids - monitor for any impulsive drinking behaviors. Recommend aspiration precautions, setup and Supervision during meals for follow through w/ precautions.  ? ?Medication Administration: Crushed with puree (for safer swallowing d/t oral phase/Cognition)  ?  ?Other  Recommendations Recommended Consults:  (Dietician f/u) ?Oral Care Recommendations: Oral care BID;Oral care before and after PO;Staff/trained caregiver to provide oral care (support pt) ?Other Recommendations:  (n/a)   ? ?Recommendations for follow up therapy are one component of a multi-disciplinary discharge planning process, led by the attending physician.  Recommendations may be updated based on patient status, additional functional criteria and insurance authorization. ? ?Follow up Recommendations Acute inpatient rehab (3hours/day)  ? ? ?  ?Assistance Recommended at Discharge Intermittent Supervision/Assistance  ?Functional Status Assessment Patient has had a recent decline in their functional status and demonstrates the ability to make significant improvements in function in a reasonable and predictable amount of time.   ?Frequency and Duration min 3x week  ?2 weeks ?  ?   ? ?Prognosis Prognosis for Safe Diet Advancement: Fair (-Good) ?Barriers to Reach Goals: Time post onset;Severity of deficits (Missing Upper Dentition) ?Barriers/Prognosis Comment: current Neurological status; missing Upper Dentition  ? ?  ? ?Swallow Study   ?General Date of Onset: 05/20/21 ?HPI: [place] 71 [place] [id number] with a history of HTN, HLD, CAD s/p stent placement, CVA and recent hospitalization for altered mental status found to have acute cystitis.  Pt lives alone, hospitalized 2 months ago from 1/7 to 02/03/2021 with severe sepsis secondary to UTI.  He presented with a fall, found to have severe malnutrition and likely cognitive impairment who was brought to the ED today after was found  in the home with altered mental status, covered with urine and feces by family members.  Last known normal was several days prior.  There was some noted blood on the floor and patient had some bruising around his left eye.  ED course: On arrival, tachycardic to 114 and slightly hypothermic at 97.4.  Labs with leukocytosis of 13,000 and hemoglobin 12.6.  Normal CK.  Potassium 3.2, creatinine 0.58, AST/ALT 50/32, EtOH less than 10, troponin 10, COVID and flu negative.  MRI revealed: extensive multicompartment hemorrhage (IPH, SAH, SDH, IVH) with evidence of head trauma. No vascular abnl identified on CTA. Head CT at 6 hrs was stable(per Neurology note). ?Type of Study: Bedside Swallow Evaluation ?Previous Swallow Assessment: none ?Diet Prior to this Study: NPO ?Temperature Spikes Noted: No (wbc 8.5) ?Respiratory Status: Room air ?History of Recent Intubation: No ?  Behavior/Cognition: Alert;Cooperative;Pleasant mood;Confused;Distractible;Requires cueing ?Oral Cavity Assessment: Dry (poor oral care) ?Oral Care Completed by SLP: Yes (assisted pt w/ hand over hand) ?Oral Cavity - Dentition:  (bottom dentition; no upper dentition) ?Vision: Functional for  self-feeding ?Self-Feeding Abilities: Able to feed self;Needs assist;Needs set up ?Patient Positioning: Upright in bed (needed positioning support/cues) ?Baseline Vocal Quality: Low vocal intensity (mumbled; r

## 2021-05-22 NOTE — Evaluation (Signed)
Occupational Therapy Evaluation ?Patient Details ?Name: [name] ?MRN: [id number] ?DOB: 1950-10-12 ?Today's Date: 05/22/2021 ? ? ?History of Present Illness 71 yo man admitted  With encephalopathy after being found in bed covered in urine and feces. Was last seen by family a few days prior. Blood was noted on floor by EMS and patient has extensive bruising around L eye. CT head showed extensive hemorrhage. PMH significant for HTN, HL, CAD, suspected baseline cognitive impairment  ? ?Clinical Impression ?  ?Chart reviewed, RN cleared pt for participation in OT evaluation. Pt with 1 mitt donned (one off directly after SLP eval) Co-tx completed with PT on this date. PTA pt has a hx of falls (at least 2), lives alone and performs ADL with MOD I-I per brother report. Assist required for IADLs such as shopping however pt prepares simple meals and cleans/does laundry per brother Chrissie Noa. Pt is oriented to self, to place (provided 3 options), not oriented to time/situation. Pt requires increased time and tactile/vcs for participation in simple 1 step directives  however does participate in all tasks. Pt brother endorses pt cognition is impaired as compared to baseline. Step by step vcs in addition to physical assist required for all ADL task completion (see below). Pt presents with deficits in cognition, activity tolerance, strength all affecting optimal and safe ADL task completion. Pt would benefit AIR to address deficits and to facilitate safe ADL.mobility completion. Pt is left in chair, NAD, all needs met.  B mitts donned. RN present. OT will continue to follow while admitted to address functional deficits.  ? ?Pt noted with low BP with positional changes with RN aware of pt status. HR up to 156 with amb approx 8 feet.  ? ?BP checked in RUE: ?Sitting in recliner after gait: 82/39 mmHg MAP 52 ?Supine in recliner: 98/70 mmHg MAP 76 ?Sitting upright in recliner: 101/63 mmHg MAP 75 ?Sitting upright in chair at end of  session: 90/62 mmHg MAP 71 (after standing additional time) ?   ? ?Recommendations for follow up therapy are one component of a multi-disciplinary discharge planning process, led by the attending physician.  Recommendations may be updated based on patient status, additional functional criteria and insurance authorization.  ? ?Follow Up Recommendations ? Acute inpatient rehab (3hours/day)  ?  ?Assistance Recommended at Discharge Frequent or constant Supervision/Assistance  ?Patient can return home with the following A lot of help with walking and/or transfers;A lot of help with bathing/dressing/bathroom;Assistance with cooking/housework;Direct supervision/assist for financial management;Assist for transportation;Direct supervision/assist for medications management;Help with stairs or ramp for entrance ? ?  ?Functional Status Assessment ? Patient has had a recent decline in their functional status and demonstrates the ability to make significant improvements in function in a reasonable and predictable amount of time.  ?Equipment Recommendations ? BSC/3in1;Tub/shower seat  ?  ?Recommendations for Other Services   ? ? ?  ?Precautions / Restrictions Precautions ?Precautions: Fall ?Precaution Comments: watch BP with mobility ?Restrictions ?Weight Bearing Restrictions: No  ? ?  ? ?Mobility Bed Mobility ?Overal bed mobility: Needs Assistance ?Bed Mobility: Supine to Sit ?  ?  ?Supine to sit: Supervision, HOB elevated ?  ?  ?General bed mobility comments: step by step vcs for participation ?  ? ?Transfers ?Overall transfer level: Needs assistance ?Equipment used: 2 person hand held assist ?Transfers: Sit to/from Stand ?Sit to Stand: Min assist, Mod assist ?  ?  ?  ?  ?  ?  ?  ? ?  ?Balance Overall balance assessment:  Needs assistance, History of Falls ?Sitting-balance support: Feet supported, Bilateral upper extremity supported ?Sitting balance-Leahy Scale: Fair ?  ?  ?Standing balance support: Bilateral upper extremity  supported, During functional activity ?Standing balance-Leahy Scale: Poor ?  ?  ?  ?  ?  ?  ?  ?  ?  ?  ?  ?  ?   ? ?ADL either performed or assessed with clinical judgement  ? ?ADL Overall ADL's : Needs assistance/impaired ?Eating/Feeding: Minimal assistance;Sitting;Cueing for sequencing ?  ?Grooming: Wash/dry face;Sitting;Moderate assistance;Cueing for compensatory techniques;Cueing for sequencing ?  ?  ?  ?  ?  ?Upper Body Dressing : Moderate assistance;Sitting;Cueing for safety;Cueing for sequencing ?  ?Lower Body Dressing: Maximal assistance;Cueing for sequencing ?Lower Body Dressing Details (indicate cue type and reason): socks bed level ?Toilet Transfer: Minimal assistance;Moderate assistance;Cueing for safety;Cueing for sequencing ?Toilet Transfer Details (indicate cue type and reason): HHA simulated to bedside chair ?Toileting- Clothing Manipulation and Hygiene: Total assistance ?  ?  ?  ?Functional mobility during ADLs: Minimal assistance;Moderate assistance;+2 for physical assistance;+2 for safety/equipment (HHA +2 approx 8 feet) ?   ? ? ? ?Vision   ?Additional Comments: will continue to asess through functional tasks and formally as needed, brusing and edema noted around L orbit  ?   ?Perception Perception ?Perception:  (pt unable to participate at this time due to cognition) ?  ?Praxis Praxis ?Praxis:  (will continue to assess) ?  ? ?Pertinent Vitals/Pain Pain Assessment ?Pain Assessment: Faces ?Faces Pain Scale: No hurt  ? ? ? ?Hand Dominance Left ?  ?Extremity/Trunk Assessment Upper Extremity Assessment ?Upper Extremity Assessment: Generalized weakness;Difficult to assess due to impaired cognition (pt able to bring cup to mouth with speech with B hands prior to OT evaluation) ?  ?Lower Extremity Assessment ?Lower Extremity Assessment: Generalized weakness;Difficult to assess due to impaired cognition ?  ?  ?  ?Communication Communication ?Communication: No difficulties (Pt doesn't verbalize much  throughout session.) ?  ?Cognition Arousal/Alertness: Awake/alert ?Behavior During Therapy: Flat affect ?Overall Cognitive Status: Impaired/Different from baseline ?Area of Impairment: Orientation, Attention, Memory, Following commands, Awareness, Problem solving, Safety/judgement, Rancho level ?  ?  ?  ?  ?  ?  ?  ?Rancho Levels of Cognitive Functioning ?Rancho [employer] Scales of Cognitive Functioning: Confused/inappropriate/non-agitated ?Orientation Level: Disoriented to, Situation, Time (oriented to place given 3 choices) ?  ?Memory: Decreased short-term memory ?Following Commands: Follows one step commands with increased time ?Safety/Judgement: Decreased awareness of deficits, Decreased awareness of safety ?Awareness: Intellectual ?Problem Solving: Slow processing, Decreased initiation, Difficulty sequencing, Requires verbal cues, Requires tactile cues ?  ?Rancho [employer] Scales of Cognitive Functioning: Confused/inappropriate/non-agitated ?  ?General Comments    ? ?  ?Exercises Other Exercises ?Other Exercises: edu pt and brother re: role of OT, role of rehab, discharge recommendations ?  ?Shoulder Instructions    ? ? ?Home Living Family/patient expects to be discharged to:: Private residence ?Living Arrangements: Alone ?Available Help at Discharge: Family;Available PRN/intermittently ?Type of Home: House ?Home Access: Level entry ?  ?  ?Home Layout: One level ?  ?  ?Bathroom Shower/Tub: Walk-in shower ?  ?Bathroom Toilet: Standard ?  ?  ?Home Equipment: Conservation officer, nature (2 wheels);Wheelchair - manual;Transport chair;Grab bars - tub/shower ?  ?Additional Comments: per chart review ?  ? ?  ?Prior Functioning/Environment Prior Level of Function : Needs assist ?  ?  ?  ?  ?  ?  ?Mobility Comments: Pt brother report amb with no AD, has access  to RW; At least 2 falls PTA,poentiallymore ?ADLs Comments: Pt brother reports pt MOD I-I in ADL, assist for IADLs such as driving/grocery shopping from friend/family ?  ? ?   ?  ?OT Problem List: Decreased strength;Decreased knowledge of use of DME or AE;Decreased knowledge of precautions;Decreased activity tolerance;Decreased cognition;Impaired balance (sitting and/or standing);D

## 2021-05-23 ENCOUNTER — Inpatient Hospital Stay (HOSPITAL_COMMUNITY)
Admit: 2021-05-23 | Discharge: 2021-05-23 | Disposition: A | Discharge status: Home / Self Care | Payer: Medicare Other | Attending: Neurology | Admitting: Neurology

## 2021-05-23 ENCOUNTER — Inpatient Hospital Stay: Payer: Medicare Other

## 2021-05-23 DIAGNOSIS — Y92009 Unspecified place in unspecified non-institutional (private) residence as the place of occurrence of the external cause: Secondary | ICD-10-CM | POA: Diagnosis not present

## 2021-05-23 DIAGNOSIS — I6310 Cerebral infarction due to embolism of unspecified precerebral artery: Secondary | ICD-10-CM

## 2021-05-23 DIAGNOSIS — I6389 Other cerebral infarction: Secondary | ICD-10-CM | POA: Diagnosis not present

## 2021-05-23 DIAGNOSIS — I619 Nontraumatic intracerebral hemorrhage, unspecified: Secondary | ICD-10-CM | POA: Diagnosis not present

## 2021-05-23 DIAGNOSIS — W19XXXA Unspecified fall, initial encounter: Secondary | ICD-10-CM | POA: Diagnosis not present

## 2021-05-23 DIAGNOSIS — R4189 Other symptoms and signs involving cognitive functions and awareness: Secondary | ICD-10-CM | POA: Diagnosis not present

## 2021-05-23 DIAGNOSIS — I6200 Nontraumatic subdural hemorrhage, unspecified: Secondary | ICD-10-CM | POA: Diagnosis not present

## 2021-05-23 LAB — ECHOCARDIOGRAM COMPLETE BUBBLE STUDY
AR max vel: 0.65 cm2
AV Area VTI: 0.74 cm2
AV Area mean vel: 0.72 cm2
AV Mean grad: 12 mmHg
AV Peak grad: 23.2 mmHg
Ao pk vel: 2.41 m/s
Calc EF: 74.1 %
MV VTI: 2.25 cm2
S' Lateral: 4.07 cm
Single Plane A2C EF: 78.4 %
Single Plane A4C EF: 74.4 %

## 2021-05-23 LAB — GLUCOSE, CAPILLARY
Glucose-Capillary: 139 mg/dL — ABNORMAL HIGH (ref 70–99)
Glucose-Capillary: 139 mg/dL — ABNORMAL HIGH (ref 70–99)
Glucose-Capillary: 148 mg/dL — ABNORMAL HIGH (ref 70–99)
Glucose-Capillary: 149 mg/dL — ABNORMAL HIGH (ref 70–99)
Glucose-Capillary: 181 mg/dL — ABNORMAL HIGH (ref 70–99)
Glucose-Capillary: 185 mg/dL — ABNORMAL HIGH (ref 70–99)

## 2021-05-23 MED ORDER — ISOSORBIDE MONONITRATE ER 30 MG PO TB24
30.0000 mg | ORAL_TABLET | Freq: Every day | ORAL | Status: DC
Start: 2021-05-23 — End: 2021-05-25
  Administered 2021-05-23 – 2021-05-24 (×2): 30 mg via ORAL
  Filled 2021-05-23 (×3): qty 1

## 2021-05-23 MED ORDER — METOPROLOL SUCCINATE ER 25 MG PO TB24
25.0000 mg | ORAL_TABLET | Freq: Every day | ORAL | Status: DC
  Administered 2021-05-23 – 2021-05-24 (×2): 25 mg via ORAL
  Filled 2021-05-23 (×2): qty 1

## 2021-05-23 MED ORDER — PERFLUTREN LIPID MICROSPHERE
1.0000 mL | INTRAVENOUS | Status: AC | PRN
  Administered 2021-05-23: 3 mL via INTRAVENOUS
  Filled 2021-05-23: qty 10

## 2021-05-23 MED ORDER — CYANOCOBALAMIN 500 MCG PO TABS
500.0000 ug | ORAL_TABLET | Freq: Every day | ORAL | Status: DC
  Administered 2021-05-23 – 2021-05-25 (×3): 500 ug via ORAL
  Filled 2021-05-23 (×4): qty 1

## 2021-05-23 MED ORDER — LEVETIRACETAM 500 MG PO TABS: 500.0000 mg | ORAL_TABLET | Freq: Two times a day (BID) | ORAL | Status: DC

## 2021-05-23 MED ORDER — VITAMIN D (ERGOCALCIFEROL) 1.25 MG (50000 UNIT) PO CAPS
50000.0000 [IU] | ORAL_CAPSULE | ORAL | Status: DC
  Administered 2021-05-23: 50000 [IU] via ORAL
  Filled 2021-05-23: qty 1

## 2021-05-23 MED ORDER — SIMVASTATIN 20 MG PO TABS: 40.0000 mg | ORAL_TABLET | Freq: Every day | ORAL | Status: DC

## 2021-05-23 MED ORDER — LEVETIRACETAM 500 MG PO TABS
1000.0000 mg | ORAL_TABLET | Freq: Two times a day (BID) | ORAL | Status: DC
Start: 2021-05-23 — End: 2021-05-25
  Administered 2021-05-23 – 2021-05-24 (×3): 1000 mg via ORAL
  Filled 2021-05-23 (×4): qty 2

## 2021-05-23 MED ORDER — AMLODIPINE BESYLATE 5 MG PO TABS
5.0000 mg | ORAL_TABLET | Freq: Every day | ORAL | Status: DC
  Administered 2021-05-23 – 2021-05-25 (×3): 5 mg via ORAL
  Filled 2021-05-23 (×4): qty 1

## 2021-05-23 NOTE — Progress Notes (Signed)
Speech Language Pathology Treatment:    ?Patient Details ?Name: [name] ?MRN: [id number] ?DOB: [birth year] ?Today's Date: 05/23/2021 ?Time: 8592-9244 ?SLP Time Calculation (min) (ACUTE ONLY): 10 min ? ?Assessment / Plan / Recommendation ?Clinical Impression ? Pt seen for diet tolerance. Pt alert. Upright in chair. Limited spontaneous verbalizations. Cleared with RN. RN noted pt with difficulty feeding self this morning. RN denied pt with observed s/sx pharyngeal dysphagia.  ? ?Pt observed with trials of pureed texture (4 oz) and thin liquids (~4 oz via straw). With set up, pt fed self. Verbal cues needed for slow rate of intake. Pt continues with s/sx oral dysphagia as evidenced by anterior loss of pureed on L. Pharyngeal swallow appeared [facility] per clinical assessment. No overt or subtle s/sx pharyngeal dysphagia noted. No change to respiratory status with trials. Of note, pt HR elevated throughout session - RN awre. ? ?Recommend continuation of a pureed diet with thin liquids and safe swallowing strategies/aspiration precautions as outlined below.  ? ?SLP to f/u per POC for trials of upgraded textures as appropriate.  ? ?Pt and RN made aware of results, recommendations, and SLP POC. ?full understanding by pt.  ?  ?HPI HPI: Pt is a 71 y.o. male from [name] with a history of HTN, HLD, CAD s/p stent placement, CVA and recent hospitalization for altered mental status found to have acute cystitis.  Pt lives alone, hospitalized 2 months ago from 1/7 to 02/03/2021 with severe sepsis secondary to UTI.  He presented with a fall, found to have severe malnutrition and likely cognitive impairment who was brought to the ED today after was found  in the home with altered mental status, covered with urine and feces by family members.  Last known normal was several days prior.  There was some noted blood on the floor and patient had some bruising around his left eye.  ED course: On arrival, tachycardic to 114 and  slightly hypothermic at 97.4.  Labs with leukocytosis of 13,000 and hemoglobin 12.6.  Normal CK.  Potassium 3.2, creatinine 0.58, AST/ALT 50/32, EtOH less than 10, troponin 10, COVID and flu negative.  MRI revealed: extensive multicompartment hemorrhage (IPH, SAH, SDH, IVH) with evidence of head trauma. No vascular abnl identified on CTA. Head CT at 6 hrs was stable(per Neurology note). ?  ?   ?SLP Plan ? Continue with current plan of care ? ?  ?  ?Recommendations for follow up therapy are one component of a multi-disciplinary discharge planning process, led by the attending physician.  Recommendations may be updated based on patient status, additional functional criteria and insurance authorization. ?  ? ?Recommendations  ?Diet recommendations: Dysphagia 1 (puree);Thin liquid ?Liquids provided via: Cup;Straw ?Medication Administration: Crushed with puree ?Supervision: Patient able to self feed;Staff to assist with self feeding;Full supervision/cueing for compensatory strategies ?Compensations: Minimize environmental distractions;Slow rate;Small sips/bites ?Postural Changes and/or Swallow Maneuvers: Out of bed for meals;Seated upright 90 degrees;Upright 30-60 min after meal  ?   ?    ?   ? ? ? ? General recommendations: Rehab consult (dietician consult) ?Oral Care Recommendations: Oral care BID;Oral care before and after PO;Staff/trained caregiver to provide oral care ?Follow Up Recommendations: Acute inpatient rehab (3hours/day) ?Assistance recommended at discharge: Frequent or constant Supervision/Assistance ?SLP Visit Diagnosis: Dysphagia, unspecified (R13.10) ?Plan: Continue with current plan of care ? ? ? ? ?  ?  ? ?[name], M.S., CCC-SLP ?Speech-Language Pathologist ?[place] [facility] ?[phone])  ? ?Victorino Dike  Cassandria Anger ? ?05/23/2021, 2:09 PM ?

## 2021-05-23 NOTE — Progress Notes (Signed)
Inpatient Rehab Admissions Coordinator:  ? ?I spoke with Pt.'s daughter regarding potential CIR admit. She states that Pt. Does not have more than occasional support at home and she states that she would prefer that Pt. Go to rehab somewhere where he could stay longer and potentially discharge closer to independence. Recommend TOC look  into SNF for this Pt. CIR will sign off.  ? ?Megan Salon, MS, CCC-SLP ?Rehab Admissions Coordinator  ?[phone] (celll) ?[phone] (office) ? ?

## 2021-05-23 NOTE — Progress Notes (Addendum)
This patient transferred from ICU. Prior to transfer to 1C MEWS score was 3 2/2 to soft BP/hypotension and tachycardia HR ~125. Upon transfer to 1C noted patient to be drowsy and somnolent, responded to voice which made his MEWS score 4. Called Quentin Angst, his previous nurse from ICU. Per ICU nurse he was sleepy prior to transfer as he was on the recliner this morning for several hrs and worked with PT.  Given this patient is new to me called Dr. Myriam Forehand to let him know that MEWS score 4 (red). Per Dr. Myriam Forehand today atenolol resumed for HR. Patient has minimal speech, slurry and can be lethargic at times. Per MD not to call rapid response at this time. MD ordered STAT head CT. Will continue to monitor VS per MEWS protocol.  ?

## 2021-05-23 NOTE — Progress Notes (Addendum)
Physical Therapy Treatment ?Patient Details ?Name: [name] ?MRN: [id number] ?DOB: Oct 05, 1950 ?Today's Date: 05/23/2021 ? ? ?History of Present Illness 71 yo man admitted  With encephalopathy after being found in bed covered in urine and feces. Was last seen by family a few days prior. Blood was noted on floor by EMS and patient has extensive bruising around L eye. CT head showed extensive hemorrhage. PMH significant for HTN, HL, CAD, suspected baseline cognitive impairment ? ?  ?PT Comments  ? ? Pt seen for PT tx with pt received asleep in recliner with SLP requesting assistance. PT & SLP utilized cold wet cloth on forehead and pt awakened with verbal communication. BP in RUE initially 70s/60s & nurse notified & came in to assess pt. BP re-checked & 90/77 mmHg. Pt with increasing alertness, able to state name & that he's in "[name]". PT assisted pt with STS with min<>Mod assist to further increase alertness. When asked to perform marching in place pt returns to sitting. Pt appears to have R gaze preference but can track & locate PT to L of midline when asked. PT noted to have elevated HR, up to 160bpm, once sitting in recliner -- nurse notified. Pt left in care of SLP. Per chart, pt's family hoping to pursue SNF rehab vs CIR so updated recommendations to reflect. Will continue to follow pt acutely to address cognition, balance, and gait with LRAD. ? ?  ?Recommendations for follow up therapy are one component of a multi-disciplinary discharge planning process, led by the attending physician.  Recommendations may be updated based on patient status, additional functional criteria and insurance authorization. ? ?Follow Up Recommendations ? Skilled nursing-short term rehab (<3 hours/day) ?  ?  ?Assistance Recommended at Discharge Frequent or constant Supervision/Assistance  ?Patient can return home with the following A lot of help with walking and/or transfers;A lot of help with  bathing/dressing/bathroom;Assistance with feeding;Assistance with cooking/housework;Direct supervision/assist for financial management;Assist for transportation;Direct supervision/assist for medications management;Help with stairs or ramp for entrance ?  ?Equipment Recommendations ? None recommended by PT  ?  ?Recommendations for Other Services   ? ? ?  ?Precautions / Restrictions Precautions ?Precautions: Fall ?Precaution Comments: watch BP, HR with mobility ?Restrictions ?Weight Bearing Restrictions: No  ?  ? ?Mobility ? Bed Mobility ?  ?  ?  ?  ?  ?  ?  ?General bed mobility comments: pt received & left in recliner ?  ? ?Transfers ?Overall transfer level: Needs assistance ?Equipment used: 1 person hand held assist ?Transfers: Sit to/from Stand ?Sit to Stand: Min assist, Mod assist ?  ?  ?  ?  ?  ?General transfer comment: cuing to initiate & shift pelvis anteriorly to come to full upright standing posture ?  ? ?Ambulation/Gait ?  ?  ?  ?  ?  ?  ?  ?  ? ? ?Stairs ?  ?  ?  ?  ?  ? ? ?Wheelchair Mobility ?  ? ?Modified Rankin (Stroke Patients Only) ?  ? ? ?  ?Balance Overall balance assessment: Needs assistance, History of Falls ?  ?Sitting balance-Leahy Scale: Fair ?  ?  ?Standing balance support: Bilateral upper extremity supported, During functional activity ?Standing balance-Leahy Scale: Poor ?  ?  ?  ?  ?  ?  ?  ?  ?  ?  ?  ?  ?  ? ?  ?Cognition Arousal/Alertness: Lethargic ?Behavior During Therapy: Flat affect ?Overall Cognitive Status: Impaired/Different from baseline ?Area of  Impairment: Orientation, Attention, Memory, Following commands, Awareness, Problem solving, Safety/judgement, Rancho level ?  ?  ?  ?  ?  ?  ?  ?Rancho Levels of Cognitive Functioning ?Rancho [employer] Scales of Cognitive Functioning: Confused/inappropriate/non-agitated ?Orientation Level: Disoriented to, Time, Situation (able to state name & that he's in "[name]") ?  ?Memory: Decreased short-term memory ?Following Commands:  Follows one step commands with increased time, Follows one step commands inconsistently ?Safety/Judgement: Decreased awareness of deficits, Decreased awareness of safety ?Awareness: Intellectual ?Problem Solving: Slow processing, Decreased initiation, Difficulty sequencing, Requires verbal cues, Requires tactile cues ?  ?  ?Rancho [employer] Scales of Cognitive Functioning: Confused/inappropriate/non-agitated ? ?  ?Exercises   ? ?  ?General Comments   ?  ?  ? ?Pertinent Vitals/Pain Pain Assessment ?Pain Assessment: Faces ?Faces Pain Scale: No hurt  ? ? ?Home Living   ?  ?  ?  ?  ?  ?  ?  ?  ?  ?   ?  ?Prior Function    ?  ?  ?   ? ?PT Goals (current goals can now be found in the care plan section) Acute Rehab PT Goals ?Patient Stated Goal: none stated ?PT Goal Formulation: Patient unable to participate in goal setting ?Time For Goal Achievement: 06/05/21 ?Potential to Achieve Goals: Fair ?Progress towards PT goals: Progressing toward goals ? ?  ?Frequency ? ? ? 7X/week ? ? ? ?  ?PT Plan Discharge plan needs to be updated  ? ? ?Co-evaluation PT/OT/SLP Co-Evaluation/Treatment: Yes ?Reason for Co-Treatment: For patient/therapist safety ?PT goals addressed during session: Mobility/safety with mobility;Balance ?  ?SLP goals addressed during session: Swallowing;Cognition ? ?  ?AM-PAC PT "6 Clicks" Mobility   ?Outcome Measure ? Help needed turning from your back to your side while in a flat bed without using bedrails?: A Little ?Help needed moving from lying on your back to sitting on the side of a flat bed without using bedrails?: A Little ?Help needed moving to and from a bed to a chair (including a wheelchair)?: A Lot ?Help needed standing up from a chair using your arms (e.g., wheelchair or bedside chair)?: A Lot ?Help needed to walk in hospital room?: A Lot ?Help needed climbing 3-5 steps with a railing? : A Lot ?6 Click Score: 14 ? ?  ?End of Session   ?Activity Tolerance: Patient tolerated treatment well ?Patient  left: in chair (in handoff to SLP) ?Nurse Communication:  (BP, HR) ?PT Visit Diagnosis: Unsteadiness on feet (R26.81);Muscle weakness (generalized) (M62.81);Difficulty in walking, not elsewhere classified (R26.2) ?  ? ? ?Time: [id number] ?PT Time Calculation (min) (ACUTE ONLY): 14 min ? ?Charges:  $Therapeutic Activity: 8-22 mins          ?          ? ?Lavone Nian, PT, DPT ?05/23/21, 11:46 AM ? ? ? ?Waunita Schooner ?05/23/2021, 11:46 AM ? ?

## 2021-05-23 NOTE — Evaluation (Signed)
Speech Language Pathology Evaluation ?Patient Details ?Name: [name] ?MRN: [id number] ?DOB: 07-08-1950 ?Today's Date: 05/23/2021 ?Time: 8299-3716 ?SLP Time Calculation (min) (ACUTE ONLY): 20 min ? ?Problem List:  ?Patient Active Problem List  ? Diagnosis Date Noted  ? Embolic stroke (HCC) 05/22/2021  ? Periorbital contusion, left, initial encounter 05/21/2021  ? Intraparenchymal hemorrhage of brain (HCC) 05/20/2021  ? Cognitive deficits 05/20/2021  ? Subdural hemorrhage (HCC) 05/20/2021  ? Hematoma of frontal scalp 05/20/2021  ? Fracture of inferior orbital wall, right (HCC) 05/20/2021  ? Aortic atherosclerosis (HCC) 04/29/2021  ? AAA (abdominal aortic aneurysm) without rupture (HCC) 04/29/2021  ? Protein-calorie malnutrition, severe 02/02/2021  ? Fall at home, initial encounter 01/31/2021  ? Falls 01/30/2021  ? UTI (urinary tract infection) 01/30/2021  ? Abnormal LFTs 01/30/2021  ? Severe sepsis (HCC) 01/30/2021  ? Dyslipidemia   ? Coronary artery disease involving native coronary artery of native heart without angina pectoris   ? Carotid artery disease (HCC)   ? Sinusitis, chronic 04/28/2008  ? Hyperlipidemia LDL goal <70 04/19/2008  ? Essential hypertension 04/19/2008  ? PAIN IN JOINT, SHOULDER 06/05/2006  ? ?Past Medical History:  ?Past Medical History:  ?Diagnosis Date  ? AAA (abdominal aortic aneurysm) without rupture (HCC) 04/29/2021  ? CT in Jan 2023: 4.4. infrarenal abdominal aortic aneurysm   ? Cerebrovascular disease   ? Coronary artery disease   ? Dyslipidemia   ? Hypertension   ? Pain in joint, shoulder region   ? ?Past Surgical History:  ?Past Surgical History:  ?Procedure Laterality Date  ? CAROTID ENDARTERECTOMY    ? left/Jan. 2006/ right;Dacron patch angioplasty  ? PTCA    ? stent placement January 2006  ? ?HPI:  ?Pt is a 71 y.o. male from [place] [address] with a history of HTN, HLD, CAD s/p stent placement, CVA and recent hospitalization for altered mental status found to have acute  cystitis.  Pt lives alone, hospitalized 2 months ago from 1/7 to 02/03/2021 with severe sepsis secondary to UTI.  He presented with a fall, found to have severe malnutrition and likely cognitive impairment who was brought to the ED today after was found  in the home with altered mental status, covered with urine and feces by family members.  Last known normal was several days prior.  There was some noted blood on the floor and patient had some bruising around his left eye.  ED course: On arrival, tachycardic to 114 and slightly hypothermic at 97.4.  Labs with leukocytosis of 13,000 and hemoglobin 12.6.  Normal CK.  Potassium 3.2, creatinine 0.58, AST/ALT 50/32, EtOH less than 10, troponin 10, COVID and flu negative.  MRI revealed: extensive multicompartment hemorrhage (IPH, SAH, SDH, IVH) with evidence of head trauma. No vascular abnl identified on CTA. Head CT at 6 hrs was stable(per Neurology note).  ? ?Assessment / Plan / Recommendation ?Clinical Impression ? Pt seen for cognitive-linguistic evaluation. Pt lethargic. Difficult to rouse at first. PT present and assisted with waking pt for participation in session (e.g. wet washcloth, standing pt). See PT note for details. Cleared with RN.  ? ?Pt presents with cognitive-linguistic deficits affecting attention (sustained, visual), memory (immediate, short term, functional), problem solving (verbal/non-verbal), abstract reasoning, insight, and spatial/temporal/situations orientation. Attention affecting auditory comprehension (basic and complex) and verbal expression (basic and complex). Inconsistent timeliness of verbal responses with often absent responses noted. Speech is c/b 1-2 word phrases with low volume, mumbling. Pt with impaired pragmatic language including reduced eye contact, initiation,  and turn taking/topic maintenance. Pt easily distracted by environmental stimuli and is restless/fidgety. Difficult to redirect at times. Pt benefited from reduced  environmental distractors, verbal/tactile cueing, and repetition of stimuli to improve sustained attention.  ? ?Based on today's assessment, anticipate need for frequent/constant supervisions, assistance with ADLs/iADLs, and post-acute SLP services. ? ?SLP to f/u per POC for continued cognitive-linguistic tx.  ? ?Pt and RN made aware of results, recommendations, and SLP POC. ?full understanding by pt.  ?   ?SLP Assessment ? SLP Recommendation/Assessment: Patient needs continued Speech Lanaguage Pathology Services ?SLP Visit Diagnosis: Attention and concentration deficit;Frontal lobe and executive function deficit;Cognitive communication deficit (R41.841)  ?  ?Recommendations for follow up therapy are one component of a multi-disciplinary discharge planning process, led by the attending physician.  Recommendations may be updated based on patient status, additional functional criteria and insurance authorization. ?   ?Follow Up Recommendations ? Acute inpatient rehab (3hours/day)  ?  ?Assistance Recommended at Discharge ? Frequent or constant Supervision/Assistance  ?Functional Status Assessment Patient has had a recent decline in their functional status and demonstrates the ability to make significant improvements in function in a reasonable and predictable amount of time.  ?Frequency and Duration min 3x week  ?2 weeks ?  ?   ?SLP Evaluation ?Cognition ? Overall Cognitive Status: Impaired/Different from baseline ?Arousal/Alertness:  (fluctuations) ?Orientation Level: Oriented to person [facility]") ?Year:  (1979) ?Attention: Focused;Sustained ?Focused Attention: Impaired ?Focused Attention Impairment: Verbal basic;Functional basic ?Sustained Attention: Impaired ?Sustained Attention Impairment: Verbal basic;Functional basic ?Memory: Impaired ?Memory Impairment: Storage deficit;Retrieval deficit;Decreased recall of new information;Decreased short term memory ?Decreased Short Term Memory: Verbal basic ?Awareness:  Impaired ?Awareness Impairment: Intellectual impairment ?Problem Solving: Impaired ?Problem Solving Impairment: Verbal basic;Functional basic ?Executive Function: Reasoning;Sequencing;Decision Making;Initiating ?Reasoning: Impaired ?Reasoning Impairment: Verbal basic;Functional basic ?Sequencing: Impaired ?Sequencing Impairment: Verbal basic;Functional basic ?Decision Making: Impaired ?Decision Making Impairment: Verbal basic;Functional basic ?Initiating: Impaired ?Initiating Impairment: Verbal basic;Functional basic ?Behaviors: Restless;Impulsive ?Safety/Judgment: Impaired ?Ranch[employer] Scales of Cognitive Functioning: Confused/inappropriate/non-agitated  ?  ?   ?Comprehension ? Auditory Comprehension ?Overall Auditory Comprehension: Impaired ?Yes/No Questions: Impaired (inconsistent; suspect due to attention) ?Commands: Impaired (inconsistent; suspect due to attention) ?Conversation: Simple ?Interfering Components: Attention;Processing speed ?EffectiveTechniques: Extra processing time;Repetition (reducing environmental distractors)  ?  ?Expression Expression ?Primary Mode of Expression: Verbal ?Verbal Expression ?Overall Verbal Expression: Impaired ?Initiation: Impaired ?Automatic Speech: Social Response (reduced social automatic speech) ?Repetition: Impaired ?Level of Impairment: Phrase level;Sentence level (suspect due to attention) ?Naming: Impairment (2/5 objects; suspect due to attention) ?Pragmatics: Impairment ?Impairments: Abnormal affect;Eye contact;Topic maintenance;Turn Taking ?Interfering Components: Attention ?Written Expression ?Dominant Hand: Left ?Written Expression: Not tested   ?Oral / Motor ? Oral Motor/Sensory Function ?Overall Oral Motor/Sensory Function: Within functional limits ?Motor Speech ?Overall Motor Speech: Impaired ?Respiration: Impaired (reduced vocal loudness) ?Phonation: Low vocal intensity ?Resonance: Within functional limits ?Articulation: Within functional  limitis ?Intelligibility: Intelligibility reduced ?Word: 50-74% accurate (low volume, mumbling)   ?        ?Clyde Canterbury, M.S., CCC-SLP ?Speech-Language Pathologist ?[facility] ?([phone]

## 2021-05-23 NOTE — Progress Notes (Signed)
S: Patient is v lethargic today. Repeat head CT this afternoon is stable.  ? ?MRI brain wo contrast ? ?1. Evaluation is significantly limited by motion artifact. Within ?this limitation, there are small acute infarcts involving the ?anterior and posterior left frontal lobe, left body of the corpus ?callosum, and right posterior pons/cerebellar peduncle. ?  ?2. Overall unchanged volume and distribution of previously noted ?intraparenchymal, subdural, and subarachnoid hemorrhage, without ?significant midline shift. ?  ?3. Hemorrhage layering in the bilateral occipital horns and possibly ?in the dependent fourth ventricle, without evidence of ?hydrocephalus. ? ?Head CT on admission  ? ?1. Multi compartment acute intracranial hemorrhage. ?2. Multiple foci of intraparenchymal hemorrhage, largest in the ?right temporal lobe measuring 2.1 x 2.1 x 2.9 cm. There also ?bifrontal hemorrhages. ?3. Diffuse subarachnoid hemorrhage. Intraventricular hemorrhage ?within the left lateral ventricle and fourth ventricle, lesser ?within basilar cisterns. ?4. Subdural hemorrhage along the falx and right frontal and temporal ?lobe. ?5. Intraventricular blood within the left lateral ventricle fourth ?ventricle and to a lesser extent basilar cisterns. ?6. Extra-axial collection overlying the left frontal convexity ?measuring 4 mm is heterogeneous in density, possible chronic ?subdural hematoma or hygroma although new from January. ?7. Mild effacement of sulci on the right.  No midline shift. ?  ?Head CT personally reviewed; I agree with above interpretation. ?  ?CTA H&N - unremarkable, no aneurysm or AVM (personal review) ?  ?CT  maxillofacial ?1. Nondisplaced right inferior orbital wall fracture, age ?indeterminate. There is right maxillary hemosinus suggesting this ?may be acute. ?2. Left frontal scalp hematoma. Left infraorbital soft tissue edema. ?3. Chronic sinusitis with opacification of left side of sphenoid ?sinus, many ethmoid air  cells, and majority of right maxillary ?sinus. Chronic cortical thickening, sclerosis and hypertrophy of the ?left maxillary and left side of sphenoid sinus. ?4. Multiple missing teeth, poor dentition. ? ?Imaging personally reviewed: I agree with above interpretation ? ?O: ? ?Vitals:  ? 05/23/21 1629 05/23/21 1727  ?BP: 131/76 134/90  ?Pulse: 93 82  ?Resp: 16 16  ?Temp: 98.4 ?F (36.9 ?C) 98 ?F (36.7 ?C)  ?SpO2: 98% 98%  ? ?Physical Exam ?Gen: alert, oriented to self, states year is 1395 and he is at the [place] ?Head: L periorbital bruising ?Resp: CTAB, no w/r/r ?CV: RRR, no m/g/r; nml S1 and S2. 2+ symmetric peripheral pulses. ?  ?Neuro: ?*MS: lethargic, stirs in response to noxious stimuli but will not attempt to speak, does not follow commands ?*Speech: no intelligible speech ?*CN: PERRL, EOMI, blinks to threat bilat, face symmetric ?*Motor & sensory: Withdraws to noxious stimuli in all 4 extremities, more briskly on L than R  ?*Coordination:  UTA ?*Reflexes:  2+ brisk and symmetric throughout without clonus; toes down-going bilat ?*Gait: deferred ?  ?ICH score = 1 ? ?A/P: 71 yo man with HTN, HL, CAD, suspected baseline cognitive impairment who lives alone, brought in by family for encephalopathy and found to have extensive multicompartment hemorrhage (IPH, SAH, SDH, IVH) with evidence of head trauma. No vascular abnl identified on CTA. Head CT at 6 hrs was stable. He has multifocal ischemic infarcts on MRI brain, suggesting central embolic source. He is clearly not a candidate for anticoagulation at this time if he were found to have a fib. TTE pending. He is very lethargic today although repeat head CT this afternoon was stable. Will increase keppra empirically to 1000mg  bid 2/2 suspicion for nonconvulsive sz (he has not had any witnessed seizures to date - 500mg  bid was for  prophylaxis). EEG tmrw ? ?- Clevidipine or IV labetalol prn for goal SBP <150 ?- TTE ?- SCDs for DVT prophylaxis ?- Increase keppra  to 1000mg  bid ?- rEEG tmrw ?- HOB elevated 30 degrees ?- Frequent neurochecks per step-down protocol ?- STAT head CT for any change in exam ?- NSU following; neurology will continue to follow as well ?  ?  ? , MD ?[facility] ?[phone] ?  ?If 7pm- 7am, please page neurology on call as listed in AMION. ?

## 2021-05-23 NOTE — TOC Initial Note (Signed)
Transition of Care (TOC) - Initial/Assessment Note  ? ? ?Patient Details  ?Name: [name] ?MRN: [id number] ?Date of Birth: 1950-04-03 ? ?Transition of Care (TOC) CM/SW Contact:    ?Romney Compean E Yicel Shannon, LCSW ?Phone Number: ?05/23/2021, 1:44 PM ? ?Clinical Narrative:                Per chart, patient is not fully oriented. CSW called and spoke to patient's daughter Morrie Sheldon. Morrie Sheldon lives out of state in [place]. ?Morrie Sheldon reported patient lives alone and his sister drives him to appointments.  ?PCP in chart is Dr. Nathanial Rancher, Morrie Sheldon was not sure if this is correct. [facility], not sure which [facility]. Patient has a RW at home.  ?Morrie Sheldon is agreeable to SNF. Sent Medicare.gov list for her to review options. SNF work up started.  ? ? ?Expected Discharge Plan: Skilled Nursing Facility ?Barriers to Discharge: Continued Medical Work up ? ? ?Patient Goals and CMS Choice ?Patient states their goals for this hospitalization and ongoing recovery are:: SNF ?CMS Medicare.gov Compare Post Acute Care list provided to:: Patient Represenative (must comment) ?Choice offered to / list presented to : Adult Children ? ?Expected Discharge Plan and Services ?Expected Discharge Plan: Skilled Nursing Facility ?  ?  ?  ?Living arrangements for the past 2 months: Single Family Home ?                ?  ?  ?  ?  ?  ?  ?  ?  ?  ?  ? ?Prior Living Arrangements/Services ?Living arrangements for the past 2 months: Single Family Home ?Lives with:: Self ?Patient language and need for interpreter reviewed:: Yes ?Do you feel safe going back to the place where you live?: Yes      ?Need for Family Participation in Patient Care: Yes (Comment) ?Care giver support system in place?: Yes (comment) ?Current home services: DME ?Criminal Activity/Legal Involvement Pertinent to Current Situation/Hospitalization: No - Comment as needed ? ?Activities of Daily Living ?Home Assistive Devices/Equipment: None ?ADL Screening (condition at time of admission) ?Patient's cognitive  ability adequate to safely complete daily activities?: No ?Is the patient deaf or have difficulty hearing?: No ?Does the patient have difficulty seeing, even when wearing glasses/contacts?: No ?Does the patient have difficulty concentrating, remembering, or making decisions?: Yes ?Patient able to express need for assistance with ADLs?: No ?Does the patient have difficulty dressing or bathing?: Yes ?Independently performs ADLs?: No ?Communication: Independent ?Dressing (OT): Dependent ?Is this a change from baseline?: Change from baseline, expected to last <3days ?Grooming: Dependent ?Is this a change from baseline?: Change from baseline, expected to last <3 days ?Feeding: Needs assistance ?Is this a change from baseline?: Change from baseline, expected to last <3 days ?Bathing: Needs assistance ?Is this a change from baseline?: Change from baseline, expected to last <3 days ?Toileting: Dependent ?Is this a change from baseline?: Change from baseline, expected to last <3 days ?In/Out Bed: Dependent ?Is this a change from baseline?: Change from baseline, expected to last <3 days ?Walks in Home: Needs assistance ?Is this a change from baseline?: Change from baseline, expected to last <3 days ?Does the patient have difficulty walking or climbing stairs?: Yes ?Weakness of Legs: Both ?Weakness of Arms/Hands: Both ? ?Permission Sought/Granted ?Permission sought to share information with : Magazine features editor ?Permission granted to share information with : Yes, Verbal Permission Granted (by daughter Morrie Sheldon) ?   ? Permission granted to share info w AGENCY: SNFs ?   ?   ? ?  Emotional Assessment ?  ?  ?  ?Orientation: : Oriented to Self ?Alcohol / Substance Use: Not Applicable ?Psych Involvement: No (comment) ? ?Admission diagnosis:  Intracranial hemorrhage (HCC) [I62.9] ?Intraparenchymal hemorrhage of brain (HCC) [I61.9] ?Patient Active Problem List  ? Diagnosis Date Noted  ? Embolic stroke (HCC) 05/22/2021  ?  Periorbital contusion, left, initial encounter 05/21/2021  ? Intraparenchymal hemorrhage of brain (HCC) 05/20/2021  ? Cognitive deficits 05/20/2021  ? Subdural hemorrhage (HCC) 05/20/2021  ? Hematoma of frontal scalp 05/20/2021  ? Fracture of inferior orbital wall, right (HCC) 05/20/2021  ? Aortic atherosclerosis (HCC) 04/29/2021  ? AAA (abdominal aortic aneurysm) without rupture (HCC) 04/29/2021  ? Protein-calorie malnutrition, severe 02/02/2021  ? Fall at home, initial encounter 01/31/2021  ? Falls 01/30/2021  ? UTI (urinary tract infection) 01/30/2021  ? Abnormal LFTs 01/30/2021  ? Severe sepsis (HCC) 01/30/2021  ? Dyslipidemia   ? Coronary artery disease involving native coronary artery of native heart without angina pectoris   ? Carotid artery disease (HCC)   ? Sinusitis, chronic 04/28/2008  ? Hyperlipidemia LDL goal <70 04/19/2008  ? Essential hypertension 04/19/2008  ? PAIN IN JOINT, SHOULDER 06/05/2006  ? ?PCP:  Ailene Ravel, MD ?Pharmacy:   ?[id number] [place], [place] - [address] AT [facility] ?[address] ?[place] [address] ?Phone: [phone] Fax: [phone] ? ? ? ? ?Social Determinants of Health (SDOH) Interventions ?  ? ?Readmission Risk Interventions ? ?  05/23/2021  ?  1:44 PM  ?Readmission Risk Prevention Plan  ?Transportation Screening Complete  ?PCP or Specialist Appt within 5-7 Days Complete  ?Home Care Screening Complete  ?Medication Review (RN CM) Complete  ? ? ? ?

## 2021-05-23 NOTE — [place] FL2 (Signed)
?[place] MEDICAID FL2 LEVEL OF CARE SCREENING TOOL  ?  ? ?IDENTIFICATION  ?Patient Name: ?[name] Birthdate: [birth year] Sex: male Admission Date (Current Location): ?05/20/2021  ?[place] Number: ? [place] ?  Facility and Address:  ?[facility], [address], [place], [place] [address] ?     Provider Number: ?[id number]  ?Attending Physician Name and Address:  ?Lurene Shadow, MD ? Relative Name and Phone Number:  ?McLaurin,Ashley (Daughter)   [phone] (Mobile) ?   ?Current Level of Care: ?Hospital Recommended Level of Care: ?Skilled Nursing Facility Prior Approval Number: ?  ? ?Date Approved/Denied: ?  PASRR Number: ?[id number] ? ?Discharge Plan: ?  ?  ? ?Current Diagnoses: ?Patient Active Problem List  ? Diagnosis Date Noted  ? Embolic stroke (HCC) 05/22/2021  ? Periorbital contusion, left, initial encounter 05/21/2021  ? Intraparenchymal hemorrhage of brain (HCC) 05/20/2021  ? Cognitive deficits 05/20/2021  ? Subdural hemorrhage (HCC) 05/20/2021  ? Hematoma of frontal scalp 05/20/2021  ? Fracture of inferior orbital wall, right (HCC) 05/20/2021  ? Aortic atherosclerosis (HCC) 04/29/2021  ? AAA (abdominal aortic aneurysm) without rupture (HCC) 04/29/2021  ? Protein-calorie malnutrition, severe 02/02/2021  ? Fall at home, initial encounter 01/31/2021  ? Falls 01/30/2021  ? UTI (urinary tract infection) 01/30/2021  ? Abnormal LFTs 01/30/2021  ? Severe sepsis (HCC) 01/30/2021  ? Dyslipidemia   ? Coronary artery disease involving native coronary artery of native heart without angina pectoris   ? Carotid artery disease (HCC)   ? Sinusitis, chronic 04/28/2008  ? Hyperlipidemia LDL goal <70 04/19/2008  ? Essential hypertension 04/19/2008  ? PAIN IN JOINT, SHOULDER 06/05/2006  ? ? ?Orientation RESPIRATION BLADDER Height & Weight   ?  ?Self ? Normal Incontinent, External catheter Weight: 120 lb (54.4 kg) ?Height:  5\' 10"  (177.8 cm)  ?BEHAVIORAL SYMPTOMS/MOOD  NEUROLOGICAL BOWEL NUTRITION STATUS  ?    Incontinent Diet (dys 1)  ?AMBULATORY STATUS COMMUNICATION OF NEEDS Skin   ?Limited Assist Verbally Skin abrasions, Bruising ?  ?  ?  ?    ?     ?     ? ? ?Personal Care Assistance Level of Assistance  ?Bathing, Feeding, Dressing Bathing Assistance: Limited assistance ?Feeding assistance: Limited assistance ?Dressing Assistance: Limited assistance ?   ? ?Functional Limitations Info  ?    ?  ?   ? ? ?SPECIAL CARE FACTORS FREQUENCY  ?PT (By licensed PT), OT (By licensed OT)   ?  ?PT Frequency: 5 times per week ?OT Frequency: 5 times per week ?  ?  ?  ?   ? ? ?Contractures    ? ? ?Additional Factors Info  ?Code Status, Allergies Code Status Info: full ?Allergies Info: nka ?  ?  ?  ?   ? ?Current Medications (05/23/2021):  This is the current hospital active medication list ?Current Facility-Administered Medications  ?Medication Dose Route Frequency Provider Last Rate Last Admin  ?  stroke: early stages of recovery book   Does not apply Once 05/25/2021, MD      ? acetaminophen (TYLENOL) tablet 650 mg  650 mg Oral Q4H PRN Andris Baumann, MD      ? Or  ? acetaminophen (TYLENOL) 160 MG/5ML solution 650 mg  650 mg Per Tube Q4H PRN Andris Baumann, MD      ? Or  ? acetaminophen (TYLENOL) suppository 650 mg  650 mg Rectal Q4H PRN Andris Baumann, MD      ?  amLODipine (NORVASC) tablet 5 mg  5 mg Oral Daily Lurene Shadow, MD   5 mg at 05/23/21 0911  ? amoxicillin-clavulanate (AUGMENTIN) 875-125 MG per tablet 1 tablet  1 tablet Oral Q12H Lurene Shadow, MD   1 tablet at 05/23/21 5176  ? Chlorhexidine Gluconate Cloth 2 % PADS 6 each  6 each Topical Q0600 Andris Baumann, MD   6 each at 05/22/21 0533  ? feeding supplement (ENSURE ENLIVE / ENSURE PLUS) liquid 237 mL  237 mL Oral BID BM Lurene Shadow, MD   237 mL at 05/23/21 0909  ? haloperidol lactate (HALDOL) injection 5 mg  5 mg Intramuscular Q6H PRN Lurene Shadow, MD   5 mg at 05/21/21 2140  ? isosorbide mononitrate (IMDUR) 24 hr  tablet 30 mg  30 mg Oral Daily Lurene Shadow, MD   30 mg at 05/23/21 0911  ? levETIRAcetam (KEPPRA) tablet 500 mg  500 mg Oral BID Tressie Ellis, RPH      ? LORazepam (ATIVAN) injection 0.5 mg  0.5 mg Intravenous Once PRN Jefferson Fuel, MD      ? metoprolol succinate (TOPROL-XL) 24 hr tablet 25 mg  25 mg Oral Daily Lurene Shadow, MD   25 mg at 05/23/21 0910  ? vitamin B-12 (CYANOCOBALAMIN) tablet 500 mcg  500 mcg Oral Daily Lurene Shadow, MD   500 mcg at 05/23/21 0911  ? Vitamin D (Ergocalciferol) (DRISDOL) capsule 50,000 Units  50,000 Units Oral Q7 days Lurene Shadow, MD   50,000 Units at 05/23/21 1607  ? ? ? ?Discharge Medications: ?Please see discharge summary for a list of discharge medications. ? ?Relevant Imaging Results: ? ?Relevant Lab Results: ? ? ?Additional Information ?SS #: [phone] ? ?Jauan Wohl E Naveen Clardy, LCSW ? ? ? ? ?

## 2021-05-23 NOTE — Progress Notes (Signed)
? ? ? ?Progress Note  ? ? ?[name]  [id number] DOB: January 28, 1950  DOA: 05/20/2021 ?PCP: Leonides Sake, MD  ? ? ? ? ?Brief Narrative:  ? ? ?Medical records reviewed and are as summarized below: ? ?[name] is a 71 y.o. male with medical history significant for hypertension, hyperlipidemia, CAD, recently hospitalized about 2 months prior to admission from 01/30/2021 through 02/03/2021 for severe sepsis secondary to UTI and a fall, cognitive impairment, severe malnutrition.  He was brought to the hospital because of altered mental status.  Reportedly, he was found down at home covered with urine and feces. ? ?He was found to have intracranial hemorrhage (intraparenchymal and subdural hemorrhage) and fracture of right inferior orbital wall ? ? ? ? ?Assessment/Plan:  ? ?Principal Problem: ?  Fall at home, initial encounter ?Active Problems: ?  Intraparenchymal hemorrhage of brain ([place]) ?  Subdural hemorrhage ([place]) ?  Sinusitis, chronic ?  Hematoma of frontal scalp ?  Fracture of inferior orbital wall, right ([place]) ?  Coronary artery disease involving native coronary artery of native heart without angina pectoris ?  AAA (abdominal aortic aneurysm) without rupture ([place]) ?  Cognitive deficits ?  Essential hypertension ?  Periorbital contusion, left, initial encounter ?  Embolic stroke ([place]) ? ? ? ?Body mass index is 17.22 kg/m?. ? ?S/p fall, Intracranial hemorrhage (subdural hemorrhage, intraparenchymal hemorrhage, subarachnoid hemorrhage),, probable embolic infarcts/stroke, acute metabolic encephalopathy:  ?MRI brain showed small acute infarct involving the anterior and posterior left frontal lobe, left body of the corpus callosum and right posterior pons/cerebellar peduncle.  Repeat CT head on 05/23/2021 did not show any progression of intracranial bleed (this was done because of reported drowsiness) ?Continue Keppra for seizure prophylaxis.  Follow-up with neurologist and neurosurgeon.  PT and OT  recommended further rehabilitation at SNF ? ?Orthostatic hypotension: BP is better. ? ?Right inferior orbital wall fracture: Case discussed with Dr. George Ina on 05/21/2021.  Outpatient follow-up with ophthalmologist recommended. ? ?Chronic sinusitis, hemosinus: Continue Augmentin. Case was discussed with Dr. Pryor Ochoa, ENT surgeon on-call on 05/21/2021 outpatient follow-up with ENT physician recommended ? ?Hypokalemia: Improved ? ?Dysphagia: Speech therapist recommended dysphagia 1 diet. ? ?AAA without rupture: This was 4.4 cm on CT on 01/30/2021.  Outpatient follow-up with vascular surgeon/PCP recommended ? ? ?Other comorbidities include hypertension, CAD, cognitive impairment ? ?Diet Order   ? ?       ?  DIET - DYS 1 Room service appropriate? Yes with Assist; Fluid consistency: Thin  Diet effective now       ?  ? ?  ?  ? ?  ? ? ? ? ? ? ? ? ?Consultants: ?Neurologist ?Neurosurgeon ? ?Procedures: ?None ? ? ? ?Medications:  ? ?  stroke: early stages of recovery book   Does not apply Once  ? amLODipine  5 mg Oral Daily  ? amoxicillin-clavulanate  1 tablet Oral Q12H  ? Chlorhexidine Gluconate Cloth  6 each Topical Q0600  ? feeding supplement  237 mL Oral BID BM  ? isosorbide mononitrate  30 mg Oral Daily  ? levETIRAcetam  500 mg Oral BID  ? metoprolol succinate  25 mg Oral Daily  ? vitamin B-12  500 mcg Oral Daily  ? Vitamin D (Ergocalciferol)  50,000 Units Oral Q7 days  ? ?Continuous Infusions: ? ? ? ? ?Anti-infectives (From admission, onward)  ? ? Start     Dose/Rate Route Frequency Ordered Stop  ? 05/22/21 1015  amoxicillin-clavulanate (AUGMENTIN) 875-125 MG per  tablet 1 tablet       ? 1 tablet Oral Every 12 hours 05/22/21 0920 05/29/21 0959  ? 05/22/21 1000  Ampicillin-Sulbactam (UNASYN) 3 g in sodium chloride 0.9 % 100 mL IVPB  Status:  Discontinued       ? 3 g ?200 mL/hr over 30 Minutes Intravenous Every 6 hours 05/22/21 0851 05/22/21 0920  ? ?  ? ? ? ? ? ? ? ? ? ?Family Communication/Anticipated D/C date and plan/Code  Status  ? ?DVT prophylaxis: SCD's Start: 05/21/21 0108 ? ? ?  Code Status: Full Code ? ?Family Communication: Plan discussed with [name], daughter, over the phone ?Disposition Plan: Plan to discharge to SNF ? ? ?Status is: Inpatient ?Remains inpatient appropriate because: Awaiting placement to SNF ? ? ? ? ? ? ?Subjective:  ? ?Interval events noted.  Patient was awake and alert and sitting up in the chair when I saw him in the ICU this morning.  However, when he was transferred to the floor, Malka, RN, reported that patient was somnolent and difficult to arouse. ? ?Objective:  ? ? ?Vitals:  ? 05/23/21 0900 05/23/21 1100 05/23/21 1243 05/23/21 1400  ?BP: (!) 129/112 90/77 100/73 112/78  ?Pulse:   (!) 116 (!) 105  ?Resp: 17 18 16    ?Temp:   (!) 97.2 ?F (36.2 ?C) 97.7 ?F (36.5 ?C)  ?TempSrc:      ?SpO2:   96% 97%  ?Weight:      ?Height:      ? ?No data found. ? ? ?Intake/Output Summary (Last 24 hours) at 05/23/2021 1457 ?Last data filed at 05/23/2021 0900 ?Gross per 24 hour  ?Intake 660 ml  ?Output 2450 ml  ?Net -1790 ml  ? ?Filed Weights  ? 05/20/21 1606  ?Weight: 54.4 kg  ? ? ?Exam: ? ?GEN: NAD ?SKIN: No rash ?EYES: EOMI ?ENT: MMM ?CV: RRR ?PULM: CTA B ?ABD: soft, ND, NT, +BS ?CNS: AAO x 1 (person), slurred speech, non focal ?EXT: No edema or tenderness ? ? ? ? ?Data Reviewed:  ? ?I have personally reviewed following labs and imaging studies: ? ?Labs: ?Labs show the following:  ? ?Basic Metabolic Panel: ?Recent Labs  ?Lab 05/20/21 ?1616 05/21/21 ?2315 05/22/21 ?[id number]  ?NA 137 135 134*  ?K 3.2* 3.2* 3.8  ?CL 98 104 102  ?CO2 28 24 23   ?GLUCOSE 154* 96 107*  ?BUN 16 7* 7*  ?CREATININE 0.58* 0.53* 0.60*  ?CALCIUM 9.8 8.7* 8.9  ?MG  --  1.9 1.9  ?PHOS  --  2.7 2.5  ? ?GFR ?Estimated Creatinine Clearance: 64.2 mL/min (A) (by C-G formula based on SCr of 0.6 mg/dL (L)). ?Liver Function Tests: ?Recent Labs  ?Lab 05/20/21 ?1616  ?AST 50*  ?ALT 32  ?ALKPHOS 92  ?BILITOT 0.8  ?PROT 8.4*  ?ALBUMIN 4.2  ? ?No results for input(s):  LIPASE, AMYLASE in the last 168 hours. ?No results for input(s): AMMONIA in the last 168 hours. ?Coagulation profile ?No results for input(s): INR, PROTIME in the last 168 hours. ? ?CBC: ?Recent Labs  ?Lab 05/20/21 ?1616 05/21/21 ?2315 05/22/21 ?[id number]  ?WBC 13.5* 7.7 8.5  ?NEUTROABS 10.5*  --   --   ?HGB 12.6* 10.7* 11.5*  ?HCT 37.5* 31.9* 34.4*  ?MCV 97.7 95.2 95.3  ?PLT 203 155 174  ? ?Cardiac Enzymes: ?Recent Labs  ?Lab 05/20/21 ?1616  ?CKTOTAL 94  ? ?BNP (last 3 results) ?No results for input(s): PROBNP in the last 8760 hours. ?CBG: ?Recent Labs  ?Lab  05/22/21 ?1921 05/22/21 ?2339 05/23/21 ?[id number] 05/23/21 ?[id number] 05/23/21 ?1129  ?GLUCAP 171* 139* 139* 149* 181*  ? ?D-Dimer: ?No results for input(s): DDIMER in the last 72 hours. ?Hgb A1c: ?Recent Labs  ?  05/20/21 ?1610  ?HGBA1C 5.6  ? ?Lipid Profile: ?Recent Labs  ?  05/20/21 ?1610  ?CHOL 195  ?HDL 87  ?[place] 83  ?TRIG 123  ?CHOLHDL 2.2  ? ?Thyroid function studies: ?No results for input(s): TSH, T4TOTAL, T3FREE, THYROIDAB in the last 72 hours. ? ?Invalid input(s): FREET3 ?Anemia work up: ?No results for input(s): VITAMINB12, FOLATE, FERRITIN, TIBC, IRON, RETICCTPCT in the last 72 hours. ?Sepsis Labs: ?Recent Labs  ?Lab 05/20/21 ?1610 05/20/21 ?1616 05/21/21 ?1209 05/21/21 ?2315 05/22/21 ?[id number]  ?WBC  --  13.5*  --  7.7 8.5  ?LATICACIDVEN 2.7*  --  1.0  --   --   ? ? ?Microbiology ?Recent Results (from the past 240 hour(s))  ?Resp Panel by RT-PCR (Flu A&B, Covid) Nasopharyngeal Swab     Status: None  ? Collection Time: 05/20/21  7:24 PM  ? Specimen: Nasopharyngeal Swab; Nasopharyngeal(NP) swabs in vial transport medium  ?Result Value Ref Range Status  ? SARS Coronavirus 2 by RT PCR NEGATIVE NEGATIVE Final  ?  Comment: (NOTE) ?SARS-CoV-2 target nucleic acids are NOT DETECTED. ? ?The SARS-CoV-2 RNA is generally detectable in upper respiratory ?specimens during the acute phase of infection. The lowest ?concentration of SARS-CoV-2 viral copies this assay can detect  is ?138 copies/mL. A negative result does not preclude SARS-Cov-2 ?infection and should not be used as the sole basis for treatment or ?other patient management decisions. A negative result may occur with  ?imprope

## 2021-05-24 DIAGNOSIS — R4182 Altered mental status, unspecified: Secondary | ICD-10-CM

## 2021-05-24 DIAGNOSIS — I6310 Cerebral infarction due to embolism of unspecified precerebral artery: Secondary | ICD-10-CM

## 2021-05-24 DIAGNOSIS — I619 Nontraumatic intracerebral hemorrhage, unspecified: Secondary | ICD-10-CM

## 2021-05-24 DIAGNOSIS — I6200 Nontraumatic subdural hemorrhage, unspecified: Secondary | ICD-10-CM

## 2021-05-24 DIAGNOSIS — E440 Moderate protein-calorie malnutrition: Secondary | ICD-10-CM | POA: Insufficient documentation

## 2021-05-24 LAB — GLUCOSE, CAPILLARY
Glucose-Capillary: 121 mg/dL — ABNORMAL HIGH (ref 70–99)
Glucose-Capillary: 133 mg/dL — ABNORMAL HIGH (ref 70–99)
Glucose-Capillary: 142 mg/dL — ABNORMAL HIGH (ref 70–99)
Glucose-Capillary: 143 mg/dL — ABNORMAL HIGH (ref 70–99)
Glucose-Capillary: 175 mg/dL — ABNORMAL HIGH (ref 70–99)

## 2021-05-24 MED ORDER — ADULT MULTIVITAMIN W/MINERALS CH
1.0000 | ORAL_TABLET | Freq: Every day | ORAL | Status: DC
  Administered 2021-05-24 – 2021-05-25 (×2): 1 via ORAL
  Filled 2021-05-24 (×3): qty 1

## 2021-05-24 MED ORDER — METOPROLOL SUCCINATE ER 50 MG PO TB24
50.0000 mg | ORAL_TABLET | Freq: Every day | ORAL | Status: DC
  Filled 2021-05-24: qty 1

## 2021-05-24 MED ORDER — NICOTINE 21 MG/24HR TD PT24
21.0000 mg | MEDICATED_PATCH | Freq: Every day | TRANSDERMAL | Status: DC
  Administered 2021-05-24 – 2021-05-27 (×4): 21 mg via TRANSDERMAL
  Filled 2021-05-24 (×4): qty 1

## 2021-05-24 MED ORDER — METOPROLOL SUCCINATE ER 25 MG PO TB24
25.0000 mg | ORAL_TABLET | Freq: Once | ORAL | Status: AC
Start: 2021-05-24 — End: 2021-05-24
  Administered 2021-05-24: 25 mg via ORAL
  Filled 2021-05-24: qty 1

## 2021-05-24 NOTE — Progress Notes (Signed)
?   05/24/21 4970  ?Assess: MEWS Score  ?Temp 98.1 ?F (36.7 ?C)  ?BP (!) 153/96 ?(Debi RN notified)  ?Pulse Rate (!) 103 ?(RN notified)  ?Resp (!) 21 ?(RN notified)  ?Level of Consciousness Alert  ?SpO2 100 %  ?O2 Device Room Air  ?Patient Activity (if Appropriate) In bed  ?Assess: MEWS Score  ?MEWS Temp 0  ?MEWS Systolic 0  ?MEWS Pulse 1  ?MEWS RR 1  ?MEWS LOC 0  ?MEWS Score 2  ?MEWS Score Color Yellow  ?Assess: if the MEWS score is Yellow or Red  ?Were vital signs taken at a resting state? Yes  ?Focused Assessment Change from prior assessment (see assessment flowsheet)  ?Does the patient meet 2 or more of the SIRS criteria? No  ?MEWS guidelines implemented *See Row Information* Yes  ?Treat  ?Pain Scale PAINAD  ?Patients Stated Pain Goal 0  ?Breathing 0  ?Negative Vocalization 0  ?Facial Expression 0  ?Body Language 0  ?Consolability 0  ?PAINAD Score 0  ?Take Vital Signs  ?Increase Vital Sign Frequency  Yellow: Q 2hr X 2 then Q 4hr X 2, if remains yellow, continue Q 4hrs  ?Escalate  ?MEWS: Escalate Yellow: discuss with charge nurse/RN and consider discussing with provider and RRT  ?Notify: Charge Nurse/RN  ?Name of Charge Nurse/RN Notified Curtis Sites, RN  ?Date Charge Nurse/RN Notified 05/24/21  ?Time Charge Nurse/RN Notified [phone]  ?Notify: Provider  ?Provider Name/Title Dr. Myriam Forehand  ?Date Provider Notified 05/24/21  ?Time Provider Notified [phone]  ?Notification Type  ?(secure message)  ?Notification Reason Other (Comment) ?(yellow mews vs)  ?Provider response No new orders  ?Date of Provider Response 05/24/21  ?Time of Provider Response 0900  ?Document  ?Patient Outcome Other (Comment) ?(gave sceduled BP meds)  ?Progress note created (see row info) Yes  ?Assess: SIRS CRITERIA  ?SIRS Temperature  0  ?SIRS Pulse 1  ?SIRS Respirations  1  ?SIRS WBC 0  ?SIRS Score Sum  2  ? ? ?

## 2021-05-24 NOTE — Progress Notes (Signed)
Still remains lethargic.  Likely secondary to the multifocal ischemia/ICH. ?We will do EEG to rule out underlying electrographic abnormalities and follow-up. ? ?-- ?Milon Dikes, MD ?Neurologist ?[facility] ?Pager: [phone] ? ?

## 2021-05-24 NOTE — Progress Notes (Signed)
? ? ? ?Progress Note  ? ? ?[name]  [id number] DOB: 09-Dec-1950  DOA: 05/20/2021 ?PCP: Leonides Sake, MD  ? ? ? ? ?Brief Narrative:  ? ? ?Medical records reviewed and are as summarized below: ? ?[name] is a 71 y.o. male with medical history significant for hypertension, hyperlipidemia, CAD, recently hospitalized about 2 months prior to admission from 01/30/2021 through 02/03/2021 for severe sepsis secondary to UTI and a fall, cognitive impairment, severe malnutrition.  He was brought to the hospital because of altered mental status.  Reportedly, he was found down at home covered with urine and feces. ? ?He was found to have intracranial hemorrhage (intraparenchymal and subdural hemorrhage) and fracture of right inferior orbital wall ? ? ? ? ?Assessment/Plan:  ? ?Principal Problem: ?  Fall at home, initial encounter ?Active Problems: ?  Intraparenchymal hemorrhage of brain ([place]) ?  Subdural hemorrhage ([place]) ?  Sinusitis, chronic ?  Hematoma of frontal scalp ?  Fracture of inferior orbital wall, right ([place]) ?  Coronary artery disease involving native coronary artery of native heart without angina pectoris ?  AAA (abdominal aortic aneurysm) without rupture ([place]) ?  Cognitive deficits ?  Essential hypertension ?  Periorbital contusion, left, initial encounter ?  Embolic stroke ([place]) ?  Malnutrition of moderate degree ? ? ? ?Body mass index is 17.22 kg/m?. ? ?S/p fall, Intracranial hemorrhage (subdural hemorrhage, intraparenchymal hemorrhage, subarachnoid hemorrhage, intraventricular), embolic infarcts/stroke, acute metabolic encephalopathy/lethargy:  ?MRI brain showed small acute infarct involving the anterior and posterior left frontal lobe, left body of the corpus callosum and right posterior pons/cerebellar peduncle.  Repeat CT head on 05/23/2021 did not show any progression of intracranial bleed (this was done because of reported drowsiness) ?Continue Keppra for seizure prophylaxis. ?EEG has been  ordered because of lethargy to rule out subclinical seizures. ?Follow-up with neurologist for further recommendations. ? ?Orthostatic hypotension: BP is better. ? ?Sinus tachycardia, s/p SVT: Patient had SVT with heart rate into the 160s while working with PT today.  Heart rate came back down after he was sat down.  Continue metoprolol but increase rate to around 25 to 50 mg daily. ? ?Right inferior orbital wall fracture: Case discussed with Dr. George Ina on 05/21/2021.  Outpatient follow-up with ophthalmologist recommended. ? ?Chronic sinusitis, hemosinus: Continue Augmentin. Case was discussed with Dr. Pryor Ochoa, ENT surgeon on-call on 05/21/2021 outpatient follow-up with ENT physician recommended ? ?Hypokalemia: Improved ? ?Dysphagia: Speech therapist recommended dysphagia 1 diet. ? ?AAA without rupture: This was 4.4 cm on CT on 01/30/2021.  Outpatient follow-up with vascular surgeon/PCP recommended ? ? ?Other comorbidities include hypertension, CAD, cognitive impairment ? ?Consulted palliative care to establish goals of care ? ?Diet Order   ? ?       ?  DIET - DYS 1 Room service appropriate? Yes with Assist; Fluid consistency: Thin  Diet effective now       ?  ? ?  ?  ? ?  ? ? ? ? ? ? ? ? ?Consultants: ?Neurologist ?Neurosurgeon ? ?Procedures: ?None ? ? ? ?Medications:  ? ?  stroke: early stages of recovery book   Does not apply Once  ? amLODipine  5 mg Oral Daily  ? amoxicillin-clavulanate  1 tablet Oral Q12H  ? Chlorhexidine Gluconate Cloth  6 each Topical Q0600  ? feeding supplement  237 mL Oral BID BM  ? isosorbide mononitrate  30 mg Oral Daily  ? levETIRAcetam  1,000 mg Oral BID  ? metoprolol  succinate  25 mg Oral Daily  ? multivitamin with minerals  1 tablet Oral Daily  ? nicotine  21 mg Transdermal Daily  ? vitamin B-12  500 mcg Oral Daily  ? Vitamin D (Ergocalciferol)  50,000 Units Oral Q7 days  ? ?Continuous Infusions: ? ? ? ? ?Anti-infectives (From admission, onward)  ? ? Start     Dose/Rate Route Frequency  Ordered Stop  ? 05/22/21 1015  amoxicillin-clavulanate (AUGMENTIN) 875-125 MG per tablet 1 tablet       ? 1 tablet Oral Every 12 hours 05/22/21 0920 05/29/21 0959  ? 05/22/21 1000  Ampicillin-Sulbactam (UNASYN) 3 g in sodium chloride 0.9 % 100 mL IVPB  Status:  Discontinued       ? 3 g ?200 mL/hr over 30 Minutes Intravenous Every 6 hours 05/22/21 0851 05/22/21 0920  ? ?  ? ? ? ? ? ? ? ? ? ?Family Communication/Anticipated D/C date and plan/Code Status  ? ?DVT prophylaxis: SCD's Start: 05/21/21 0108 ? ? ?  Code Status: Full Code ? ?Family Communication: Plan discussed with [name], daughter, over the phone ?Disposition Plan: Plan to discharge to SNF ? ? ?Status is: Inpatient ?Remains inpatient appropriate because: Awaiting placement to SNF ? ? ? ? ? ? ?Subjective:  ? ?Interval events noted.  Patient is drowsy and confused unable to provide any history. ? ?Objective:  ? ? ?Vitals:  ? 05/24/21 0005 05/24/21 0411 05/24/21 [id number] 05/24/21 1129  ?BP: (!) 159/91 (!) 154/93 (!) 153/96 119/81  ?Pulse: 95 98 (!) 103 (!) 106  ?Resp: 20 20 (!) 21 19  ?Temp: 98 ?F (36.7 ?C) 98.8 ?F (37.1 ?C) 98.1 ?F (36.7 ?C) 97.7 ?F (36.5 ?C)  ?TempSrc:   Axillary Axillary  ?SpO2: 97% 98% 100% 100%  ?Weight:      ?Height:      ? ?No data found. ? ?No intake or output data in the 24 hours ending 05/24/21 1501 ? ?Filed Weights  ? 05/20/21 1606  ?Weight: 54.4 kg  ? ? ?Exam: ? ?GEN: NAD ?SKIN: No rash ?EYES: EOMI, left periorbital ecchymosis ?ENT: MMM ?CV: RRR ?PULM: CTA B ?ABD: soft, ND, NT, +BS ?CNS: Drowsy but arousable, confused.  Speech is slurred.  No focal deficits   ?EXT: No edema or tenderness ? ? ? ? ? ?Data Reviewed:  ? ?I have personally reviewed following labs and imaging studies: ? ?Labs: ?Labs show the following:  ? ?Basic Metabolic Panel: ?Recent Labs  ?Lab 05/20/21 ?1616 05/21/21 ?2315 05/22/21 ?[id number]  ?NA 137 135 134*  ?K 3.2* 3.2* 3.8  ?CL 98 104 102  ?CO2 28 24 23   ?GLUCOSE 154* 96 107*  ?BUN 16 7* 7*  ?CREATININE 0.58* 0.53* 0.60*   ?CALCIUM 9.8 8.7* 8.9  ?MG  --  1.9 1.9  ?PHOS  --  2.7 2.5  ? ?GFR ?Estimated Creatinine Clearance: 64.2 mL/min (A) (by C-G formula based on SCr of 0.6 mg/dL (L)). ?Liver Function Tests: ?Recent Labs  ?Lab 05/20/21 ?1616  ?AST 50*  ?ALT 32  ?ALKPHOS 92  ?BILITOT 0.8  ?PROT 8.4*  ?ALBUMIN 4.2  ? ?No results for input(s): LIPASE, AMYLASE in the last 168 hours. ?No results for input(s): AMMONIA in the last 168 hours. ?Coagulation profile ?No results for input(s): INR, PROTIME in the last 168 hours. ? ?CBC: ?Recent Labs  ?Lab 05/20/21 ?1616 05/21/21 ?2315 05/22/21 ?[id number]  ?WBC 13.5* 7.7 8.5  ?NEUTROABS 10.5*  --   --   ?HGB 12.6* 10.7* 11.5*  ?HCT  37.5* 31.9* 34.4*  ?MCV 97.7 95.2 95.3  ?PLT 203 155 174  ? ?Cardiac Enzymes: ?Recent Labs  ?Lab 05/20/21 ?1616  ?CKTOTAL 94  ? ?BNP (last 3 results) ?No results for input(s): PROBNP in the last 8760 hours. ?CBG: ?Recent Labs  ?Lab 05/23/21 ?1948 05/24/21 ?0003 05/24/21 ?0408 05/24/21 ?[id number] 05/24/21 ?1150  ?GLUCAP 185* 143* 142* 133* 175*  ? ?D-Dimer: ?No results for input(s): DDIMER in the last 72 hours. ?Hgb A1c: ?No results for input(s): HGBA1C in the last 72 hours. ? ?Lipid Profile: ?No results for input(s): CHOL, HDL, LDLCALC, TRIG, CHOLHDL, LDLDIRECT in the last 72 hours. ? ?Thyroid function studies: ?No results for input(s): TSH, T4TOTAL, T3FREE, THYROIDAB in the last 72 hours. ? ?Invalid input(s): FREET3 ?Anemia work up: ?No results for input(s): VITAMINB12, FOLATE, FERRITIN, TIBC, IRON, RETICCTPCT in the last 72 hours. ?Sepsis Labs: ?Recent Labs  ?Lab 05/20/21 ?1610 05/20/21 ?1616 05/21/21 ?1209 05/21/21 ?2315 05/22/21 ?[id number]  ?WBC  --  13.5*  --  7.7 8.5  ?LATICACIDVEN 2.7*  --  1.0  --   --   ? ? ?Microbiology ?Recent Results (from the past 240 hour(s))  ?Resp Panel by RT-PCR (Flu A&B, Covid) Nasopharyngeal Swab     Status: None  ? Collection Time: 05/20/21  7:24 PM  ? Specimen: Nasopharyngeal Swab; Nasopharyngeal(NP) swabs in vial transport medium  ?Result Value  Ref Range Status  ? SARS Coronavirus 2 by RT PCR NEGATIVE NEGATIVE Final  ?  Comment: (NOTE) ?SARS-CoV-2 target nucleic acids are NOT DETECTED. ? ?The SARS-CoV-2 RNA is generally detectable in upper respi

## 2021-05-24 NOTE — Progress Notes (Signed)
Telemetry called and stated patient was in SVT at 160 BPM. Went in room and patient was being stood up with therapy and NT and I told them about his HR and they sat him back down in the bed to lay him back down they were going to transfer him to the chair. Patients HR slowly came back down to normal. Messaged MD and made him aware. ?

## 2021-05-24 NOTE — Progress Notes (Signed)
SLP Cancellation Note ? ?Patient Details ?Name: [name] ?MRN: [id number] ?DOB: Jun 02, 1950 ? ? ?Cancelled treatment:       Reason Eval/Treat Not Completed: Fatigue/lethargy limiting ability to participate  ? ?Pt not appropriate to participate in skilled SLP services targeting trials of upgraded textures or cognitive-linguistic functioning at this time due to lethargy. Will defer SLP efforts this date and continue efforts as appropriate. ? ?Cherrie Gauze, M.S., CCC-SLP ?Speech-Language Pathologist ?[facility] ?[phone] ([place])  ? ?Quintella Baton ?05/24/2021, 12:36 PM ?

## 2021-05-24 NOTE — Procedures (Signed)
Patient Name: [name]  ?MRN: [id number]  ?Epilepsy Attending: Charlsie Quest  ?Referring Physician/Provider: Jefferson Fuel, MD ?Date: 05/24/2021 ?Duration: 22.06 mins ? ?Patient history: 71yo M with ams. EEG to evaluate for seizure.  ? ?Level of alertness: asleep, lethargic  ? ?AEDs during EEG study: LEV ? ?Technical aspects: This EEG study was done with scalp electrodes positioned according to the 10-20 International system of electrode placement. Electrical activity was acquired at a sampling rate of 500Hz  and reviewed with a high frequency filter of 70Hz  and a low frequency filter of 1Hz . EEG data were recorded continuously and digitally stored.  ? ?Description: No posterior dominant rhythm was seen. Sleep was characterized by sleep spindles (12 to 14 Hz), maximal frontocentral region.  EEG showed continuous generalized 3 to 6 Hz theta-delta slowing. ?Hyperventilation and photic stimulation were not performed.    ? ?ABNORMALITY ?- Continuous slow, generalized ? ?IMPRESSION: ?This study is suggestive of moderate to severe diffuse encephalopathy, nonspecific etiology. No seizures or epileptiform discharges were seen throughout the recording. ? ?  ? ?

## 2021-05-24 NOTE — Progress Notes (Signed)
OT Cancellation Note ? ?Patient Details ?Name: [name] ?MRN: [id number] ?DOB: 01-08-1951 ? ? ?Cancelled Treatment:    Reason Eval/Treat Not Completed: Patient not medically ready; RN reports that MD would like for pt to rest today d/t elevated HR.  May attempt again tomorrow as time allows. ? ?Leta Speller, MS, OTR/L ? ?[name] ?05/24/2021, 3:57 PM ?

## 2021-05-24 NOTE — Progress Notes (Signed)
Eeg done 

## 2021-05-24 NOTE — Progress Notes (Signed)
Notified on-call NP advising that the the Pt's BP and HR remains elevated and that he continues to be lethargic. Will continue neuro checks and NIH assessment every four hours. Will notify NP if the Pt experiences any change of condition. ?

## 2021-05-24 NOTE — Progress Notes (Signed)
Initial Nutrition Assessment ? ?DOCUMENTATION CODES:  ? ?Underweight, Non-severe (moderate) malnutrition in context of chronic illness ? ?INTERVENTION:  ? ?-Continue Ensure Enlive po BID, each supplement provides 350 kcal and 20 grams of protein ?-MVI with minerals daily ?-Mighty Shake TID with meals, each supplement provides 220 kcals and 6 grams protein ?-Feeding assistance with meals ? ?NUTRITION DIAGNOSIS:  ? ?Moderate Malnutrition related to chronic illness (CAD) as evidenced by mild fat depletion, moderate fat depletion, moderate muscle depletion, mild muscle depletion. ? ?GOAL:  ? ?Patient will meet greater than or equal to 90% of their needs ? ?MONITOR:  ? ?PO intake, Supplement acceptance, Diet advancement, Labs, Weight trends, Skin, I & O's ? ?REASON FOR ASSESSMENT:  ? ?Rounds ?  ? ?ASSESSMENT:  ? ?[name] is a 71 y.o. male with medical history significant for HTN, HLD, CAD, CAD, who lives alone, hospitalized 2 months ago from 1/7 to 02/03/2021 with severe sepsis secondary to UTI presenting with a fall, found to have severe malnutrition and likely cognitive impairment who was brought to the ED today after was found  in the home with altered mental status, covered with urine and feces by family members.  Last known normal was several days prior.  There was some noted blood on the floor and patient had some bruising around his left eye. ? ?Pt admitted with SDH s/p fall.  ? ?4/29- s/p BSE- advanced to dysphagia 1 diet with thin liquids ? ?Reviewed I/O's: -480 ml x 24 hours and +498 ml since admission ? ?UOP: 600 ml x 24 hours  ? ?Pt sitting up in bed at time of visit. He did not respond to voice or touch. ? ?Case discussed with SLP, who reports pt likes Ensure supplements.  ? ?Noted meal completions 30-100%. Pt consumed about 1/3 of Ensure supplement.  ? ?Reviewed wt hx; pt has experienced a 11.4% wt loss over the past 10 months. While this is not significant for time frame, it is concerning given his  malnutrition and  multiple co-morbidities.  ?Po 30-100% ? ?Medications reviewed and include keppra, vitamin B-12, and vitamin D.  ? ?Labs reviewed: Na: 134, CBGS: 133-185.  ? ?NUTRITION - FOCUSED PHYSICAL EXAM: ? ?Flowsheet Row Most Recent Value  ?Orbital Region Moderate depletion  ?Upper Arm Region Moderate depletion  ?Thoracic and Lumbar Region Mild depletion  ?Buccal Region Severe depletion  ?Temple Region Severe depletion  ?Clavicle Bone Region Mild depletion  ?Clavicle and Acromion Bone Region Mild depletion  ?Scapular Bone Region Mild depletion  ?Dorsal Hand Mild depletion  ?Patellar Region Moderate depletion  ?Anterior Thigh Region Moderate depletion  ?Posterior Calf Region Moderate depletion  ?Edema (RD Assessment) None  ?Hair Reviewed  ?Eyes Reviewed  ?Mouth Reviewed  ?Skin Reviewed  ?Nails Reviewed  ? ?  ? ? ?Diet Order:   ?Diet Order   ? ?       ?  DIET - DYS 1 Room service appropriate? Yes with Assist; Fluid consistency: Thin  Diet effective now       ?  ? ?  ?  ? ?  ? ? ?EDUCATION NEEDS:  ? ?No education needs have been identified at this time ? ?Skin:  Skin Assessment: Reviewed RN Assessment ? ?Last BM:  05/23/21 (type 7) ? ?Height:  ? ?Ht Readings from Last 1 Encounters:  ?05/21/21 5\' 10"  (1.778 m)  ? ? ?Weight:  ? ?Wt Readings from Last 1 Encounters:  ?05/20/21 54.4 kg  ? ? ?Ideal Body Weight:  75.5 kg ? ?BMI:  Body mass index is 17.22 kg/m?. ? ?Estimated Nutritional Needs:  ? ?Kcal:  1900-2100 ? ?Protein:  95-110 grams ? ?Fluid:  > 1.9 L ? ? ? ?Levada Schilling, RD, LDN, CDCES ?Registered Dietitian II ?Certified Diabetes Care and Education Specialist ?Please refer to [facility] for RD and/or RD on-call/weekend/after hours pager  ?

## 2021-05-24 NOTE — Progress Notes (Signed)
Physical Therapy Treatment ?Patient Details ?Name: [name] ?MRN: [id number] ?DOB: [birth year] ?Today's Date: 05/24/2021 ? ? ?History of Present Illness 71 yo man admitted  With encephalopathy after being found in bed covered in urine and feces. Was last seen by family a few days prior. Blood was noted on floor by EMS and patient has extensive bruising around L eye. CT head showed extensive hemorrhage. PMH significant for HTN, HL, CAD, suspected baseline cognitive impairment ? ?  ?PT Comments  ? ? Pt seen for PT tx with pt received asleep in bed. Pt requires total assist for supine>sit as pt lethargic but wakes up a little but still has poor awareness, attention & ability to follow commands. While sitting on EOB pt attempting to lie back down the entire time despite multimodal cuing. Attempted to assist pt with transferring to recliner but pt noted to be incontinent of BM. PT attempted to provide assistance for standing while NT performed peri hygiene but pt continues to attempt to sit so pt assisted back to bed. Pt resistant to rolling but peri hygiene completed. Pt left in bed with eyes closed at end of session. ? ?Pt noted to have elevated HR  ?117 bpm while in bed, increased to 160s while sitting/attempting standing EOB, back down to 130s upon return to supine. Nurse in room & aware of HR during session. ?   ?Recommendations for follow up therapy are one component of a multi-disciplinary discharge planning process, led by the attending physician.  Recommendations may be updated based on patient status, additional functional criteria and insurance authorization. ? ?Follow Up Recommendations ? Skilled nursing-short term rehab (<3 hours/day) ?  ?  ?Assistance Recommended at Discharge Frequent or constant Supervision/Assistance  ?Patient can return home with the following Assistance with feeding;Assistance with cooking/housework;Direct supervision/assist for financial management;Assist for transportation;Direct  supervision/assist for medications management;Help with stairs or ramp for entrance;Two people to help with walking and/or transfers;Two people to help with bathing/dressing/bathroom ?  ?Equipment Recommendations ? None recommended by PT  ?  ?Recommendations for Other Services Rehab consult ? ? ?  ?Precautions / Restrictions Precautions ?Precautions: Fall ?Precaution Comments: watch BP, HR with mobility ?Restrictions ?Weight Bearing Restrictions: No  ?  ? ?Mobility ? Bed Mobility ?Overal bed mobility: Needs Assistance ?Bed Mobility: Supine to Sit, Sit to Supine, Rolling ?Rolling: Total assist, +2 for physical assistance ?  ?Supine to sit: Total assist, HOB elevated ?Sit to supine: Total assist ?  ?  ?  ? ?Transfers ?Overall transfer level: Needs assistance ?  ?  ?Sit to Stand: Max assist, Total assist ?  ?  ?  ?  ?  ?  ?  ? ?Ambulation/Gait ?  ?  ?  ?  ?  ?  ?  ?  ? ? ?Stairs ?  ?  ?  ?  ?  ? ? ?Wheelchair Mobility ?  ? ?Modified Rankin (Stroke Patients Only) ?  ? ? ?  ?Balance Overall balance assessment: Needs assistance, History of Falls ?Sitting-balance support: Feet supported, Bilateral upper extremity supported ?Sitting balance-Leahy Scale: Poor ?  ?Postural control: Posterior lean ?  ?  ?  ?  ?  ?  ?  ?  ?  ?  ?  ?  ?  ?  ?  ? ?  ?Cognition Arousal/Alertness: Lethargic ?Behavior During Therapy: Flat affect ?Overall Cognitive Status: Impaired/Different from baseline ?Area of Impairment: Orientation, Attention, Memory, Following commands, Awareness, Problem solving, Safety/judgement, Rancho level ?  ?  ?  ?  ?  ?  ?  ?  ?  ?  ?  Memory: Decreased short-term memory ?  ?Safety/Judgement: Decreased awareness of deficits, Decreased awareness of safety ?Awareness: Intellectual ?Problem Solving: Slow processing, Decreased initiation, Difficulty sequencing, Requires verbal cues, Requires tactile cues ?  ?  ?  ? ?  ?Exercises   ? ?  ?General Comments   ?  ?  ? ?Pertinent Vitals/Pain Pain Assessment ?Pain Assessment:  Faces ?Faces Pain Scale: No hurt  ? ? ?Home Living   ?  ?  ?  ?  ?  ?  ?  ?  ?  ?   ?  ?Prior Function    ?  ?  ?   ? ?PT Goals (current goals can now be found in the care plan section) Acute Rehab PT Goals ?Patient Stated Goal: none stated ?PT Goal Formulation: Patient unable to participate in goal setting ?Time For Goal Achievement: 06/05/21 ?Potential to Achieve Goals: Fair ?Progress towards PT goals: PT to reassess next treatment ? ?  ?Frequency ? ? ? 7X/week ? ? ? ?  ?PT Plan Current plan remains appropriate  ? ? ?Co-evaluation   ?  ?  ?  ?  ? ?  ?AM-PAC PT "6 Clicks" Mobility   ?Outcome Measure ? Help needed turning from your back to your side while in a flat bed without using bedrails?: A Lot ?Help needed moving from lying on your back to sitting on the side of a flat bed without using bedrails?: A Lot ?Help needed moving to and from a bed to a chair (including a wheelchair)?: A Lot ?Help needed standing up from a chair using your arms (e.g., wheelchair or bedside chair)?: A Lot ?Help needed to walk in hospital room?: A Lot ?Help needed climbing 3-5 steps with a railing? : Total ?6 Click Score: 11 ? ?  ?End of Session   ?Activity Tolerance: Treatment limited secondary to medical complications (Comment) ?Patient left: in bed;with bed alarm set;with call bell/phone within reach;with SCD's reapplied (BUE mittens donned) ?Nurse Communication: Mobility status ?PT Visit Diagnosis: Unsteadiness on feet (R26.81);Muscle weakness (generalized) (M62.81);Difficulty in walking, not elsewhere classified (R26.2) ?  ? ? ?Time: 7062-3762 ?PT Time Calculation (min) (ACUTE ONLY): 17 min ? ?Charges:  $Therapeutic Activity: 8-22 mins          ?          ? ?Aleda Grana, PT, DPT ?05/24/21, 12:11 PM ? ? ?Sandi Mariscal ?05/24/2021, 12:11 PM ? ?

## 2021-05-24 NOTE — TOC Progression Note (Signed)
Transition of Care (TOC) - Progression Note  ? ? ?Patient Details  ?Name: [name] ?MRN: [id number] ?Date of Birth: [birth year] ? ?Transition of Care (TOC) CM/SW Contact  ?Caryn Section, RN ?Phone Number: ?05/24/2021, 11:50 AM ? ?Clinical Narrative:   No bed offers.  Re sent referrals.  Care RN patient not eating, high heart rate palliative referral.  TOC to follow. ? ? ? ?Expected Discharge Plan: Skilled Nursing Facility ?Barriers to Discharge: Continued Medical Work up ? ?Expected Discharge Plan and Services ?Expected Discharge Plan: Skilled Nursing Facility ?  ?  ?  ?Living arrangements for the past 2 months: Single Family Home ?                ?  ?  ?  ?  ?  ?  ?  ?  ?  ?  ? ? ?Social Determinants of Health (SDOH) Interventions ?  ? ?Readmission Risk Interventions ? ?  05/23/2021  ?  1:44 PM  ?Readmission Risk Prevention Plan  ?Transportation Screening Complete  ?PCP or Specialist Appt within 5-7 Days Complete  ?Home Care Screening Complete  ?Medication Review (RN CM) Complete  ? ? ?

## 2021-05-25 ENCOUNTER — Inpatient Hospital Stay: Payer: Medicare Other

## 2021-05-25 DIAGNOSIS — Z515 Encounter for palliative care: Secondary | ICD-10-CM

## 2021-05-25 DIAGNOSIS — Z7189 Other specified counseling: Secondary | ICD-10-CM

## 2021-05-25 DIAGNOSIS — E785 Hyperlipidemia, unspecified: Secondary | ICD-10-CM

## 2021-05-25 LAB — GLUCOSE, CAPILLARY
Glucose-Capillary: 125 mg/dL — ABNORMAL HIGH (ref 70–99)
Glucose-Capillary: 126 mg/dL — ABNORMAL HIGH (ref 70–99)
Glucose-Capillary: 172 mg/dL — ABNORMAL HIGH (ref 70–99)
Glucose-Capillary: 109 mg/dL — ABNORMAL HIGH (ref 70–99)
Glucose-Capillary: 103 mg/dL — ABNORMAL HIGH (ref 70–99)

## 2021-05-25 LAB — BASIC METABOLIC PANEL
Anion gap: 9 (ref 5–15)
BUN: 14 mg/dL (ref 8–23)
CO2: 25 mmol/L (ref 22–32)
Calcium: 9.6 mg/dL (ref 8.9–10.3)
Chloride: 101 mmol/L (ref 98–111)
Creatinine, Ser: 0.52 mg/dL — ABNORMAL LOW (ref 0.61–1.24)
GFR, Estimated: >60 mL/min (ref >60)
Glucose, Bld: 132 mg/dL — ABNORMAL HIGH (ref 70–99)
Potassium: 3.8 mmol/L (ref 3.5–5.1)
Sodium: 135 mmol/L (ref 135–145)

## 2021-05-25 LAB — CBC
HCT: 37 % — ABNORMAL LOW (ref 39.0–52.0)
Hemoglobin: 12.5 g/dL — ABNORMAL LOW (ref 13.0–17.0)
MCH: 32.1 pg (ref 26.0–34.0)
MCHC: 33.8 g/dL (ref 30.0–36.0)
MCV: 94.9 fL (ref 80.0–100.0)
Platelets: 263 10*3/uL (ref 150–400)
RBC: 3.9 MIL/uL — ABNORMAL LOW (ref 4.22–5.81)
RDW: 13 % (ref 11.5–15.5)
WBC: 11 10*3/uL — ABNORMAL HIGH (ref 4.0–10.5)
nRBC: 0 % (ref 0.0–0.2)

## 2021-05-25 MED ORDER — METOPROLOL TARTRATE 25 MG PO TABS
25.0000 mg | ORAL_TABLET | Freq: Two times a day (BID) | ORAL | Status: DC
  Administered 2021-05-25 (×2): 25 mg via ORAL
  Filled 2021-05-25 (×3): qty 1

## 2021-05-25 MED ORDER — ISOSORBIDE MONONITRATE 20 MG PO TABS
20.0000 mg | ORAL_TABLET | Freq: Two times a day (BID) | ORAL | Status: DC
Start: 2021-05-25 — End: 2021-05-26
  Administered 2021-05-25 (×2): 20 mg via ORAL
  Filled 2021-05-25 (×3): qty 1

## 2021-05-25 MED ORDER — LEVETIRACETAM 500 MG PO TABS
500.0000 mg | ORAL_TABLET | Freq: Two times a day (BID) | ORAL | Status: DC
  Administered 2021-05-25 (×2): 500 mg via ORAL
  Filled 2021-05-25 (×3): qty 1

## 2021-05-25 MED ORDER — GADOBUTROL 1 MMOL/ML IV SOLN
5.0000 mL | Freq: Once | INTRAVENOUS | Status: AC | PRN
  Administered 2021-05-25: 5 mL via INTRAVENOUS

## 2021-05-25 MED ORDER — IOHEXOL 300 MG/ML  SOLN
100.0000 mL | Freq: Once | INTRAMUSCULAR | Status: AC | PRN
  Administered 2021-05-25: 100 mL via INTRAVENOUS

## 2021-05-25 NOTE — TOC Progression Note (Signed)
Transition of Care (TOC) - Progression Note  ? ? ?Patient Details  ?Name: [name] ?MRN: [id number] ?Date of Birth: 07-29-1950 ? ?Transition of Care (TOC) CM/SW Contact  ?Caryn Section, RN ?Phone Number: ?05/25/2021, 1:41 PM ? ?Clinical Narrative:   Palliative consult, patient not eating, drinking or taking meds as per care team.  Potential hospice, but not at this time.  Palliative will notify care team. ? ? ? ?Expected Discharge Plan: Skilled Nursing Facility ?Barriers to Discharge: Continued Medical Work up ? ?Expected Discharge Plan and Services ?Expected Discharge Plan: Skilled Nursing Facility ?  ?  ?  ?Living arrangements for the past 2 months: Single Family Home ?                ?  ?  ?  ?  ?  ?  ?  ?  ?  ?  ? ? ?Social Determinants of Health (SDOH) Interventions ?  ? ?Readmission Risk Interventions ? ?  05/23/2021  ?  1:44 PM  ?Readmission Risk Prevention Plan  ?Transportation Screening Complete  ?PCP or Specialist Appt within 5-7 Days Complete  ?Home Care Screening Complete  ?Medication Review (RN CM) Complete  ? ? ?

## 2021-05-25 NOTE — Progress Notes (Signed)
?   05/25/21 1158  ?Assess: MEWS Score  ?Temp 97.6 ?F (36.4 ?C)  ?BP 98/68  ?Pulse Rate (!) 106  ?Resp 14  ?SpO2 98 %  ?O2 Device Room Air  ?Assess: MEWS Score  ?MEWS Temp 0  ?MEWS Systolic 1  ?MEWS Pulse 1  ?MEWS RR 0  ?MEWS LOC 0  ?MEWS Score 2  ?MEWS Score Color Yellow  ?Assess: if the MEWS score is Yellow or Red  ?Were vital signs taken at a resting state? Yes  ?Focused Assessment Change from prior assessment (see assessment flowsheet)  ?Does the patient meet 2 or more of the SIRS criteria? No  ?MEWS guidelines implemented *See Row Information* Yes  ?Treat  ?MEWS Interventions Escalated (See documentation below)  ?Pain Scale PAINAD  ?Patients Stated Pain Goal 0  ?Breathing 0  ?Negative Vocalization 0  ?Facial Expression 0  ?Body Language 0  ?Consolability 0  ?PAINAD Score 0  ?Take Vital Signs  ?Increase Vital Sign Frequency  Yellow: Q 2hr X 2 then Q 4hr X 2, if remains yellow, continue Q 4hrs  ?Escalate  ?MEWS: Escalate Yellow: discuss with charge nurse/RN and consider discussing with provider and RRT  ?Notify: Charge Nurse/RN  ?Name of Charge Nurse/RN Notified Curtis Sites, RN  ?Date Charge Nurse/RN Notified 05/25/21  ?Time Charge Nurse/RN Notified 1217  ?Notify: Provider  ?Provider Name/Title Dr. Myriam Forehand  ?Date Provider Notified 05/25/21  ?Time Provider Notified 1217  ?Notification Type  ?(secure message)  ?Notification Reason  ?(yellow mews)  ?Provider response No new orders  ?Date of Provider Response 05/25/21  ?Time of Provider Response 1220  ?Document  ?Patient Outcome Other (Comment) ?(monitor)  ?Progress note created (see row info) Yes  ?Assess: SIRS CRITERIA  ?SIRS Temperature  0  ?SIRS Pulse 1  ?SIRS Respirations  0  ?SIRS WBC 0  ?SIRS Score Sum  1  ? ? ?

## 2021-05-25 NOTE — Progress Notes (Addendum)
S: Patient remains lethargic today. Repeat head CT this afternoon is stable.  ? ?MRI brain wo contrast ?1. Evaluation is significantly limited by motion artifact. Within ?this limitation, there are small acute infarcts involving the ?anterior and posterior left frontal lobe, left body of the corpus ?callosum, and right posterior pons/cerebellar peduncle. ?2. Overall unchanged volume and distribution of previously noted ?intraparenchymal, subdural, and subarachnoid hemorrhage, without ?significant midline shift. ?3. Hemorrhage layering in the bilateral occipital horns and possibly ?in the dependent fourth ventricle, without evidence of ?hydrocephalus. ? ?Head CT on admission  ?1. Multi compartment acute intracranial hemorrhage. ?2. Multiple foci of intraparenchymal hemorrhage, largest in the ?right temporal lobe measuring 2.1 x 2.1 x 2.9 cm. There also ?bifrontal hemorrhages. ?3. Diffuse subarachnoid hemorrhage. Intraventricular hemorrhage ?within the left lateral ventricle and fourth ventricle, lesser ?within basilar cisterns. ?4. Subdural hemorrhage along the falx and right frontal and temporal ?lobe. ?5. Intraventricular blood within the left lateral ventricle fourth ?ventricle and to a lesser extent basilar cisterns. ?6. Extra-axial collection overlying the left frontal convexity ?measuring 4 mm is heterogeneous in density, possible chronic ?subdural hematoma or hygroma although new from January. ?7. Mild effacement of sulci on the right.  No midline shift. ?  ?Head CT and MRI personally reviewed; I agree with above interpretation. ?  ?CTA H&N - unremarkable, no aneurysm or AVM (personal review) ?  ?CT  maxillofacial ?1. Nondisplaced right inferior orbital wall fracture, age ?indeterminate. There is right maxillary hemosinus suggesting this ?may be acute. ?2. Left frontal scalp hematoma. Left infraorbital soft tissue edema. ?3. Chronic sinusitis with opacification of left side of sphenoid ?sinus, many ethmoid air  cells, and majority of right maxillary ?sinus. Chronic cortical thickening, sclerosis and hypertrophy of the ?left maxillary and left side of sphenoid sinus. ?4. Multiple missing teeth, poor dentition. ? ?Imaging personally reviewed: I agree with above interpretation ? ?O: ? ?Vitals:  ? 05/25/21 0519 05/25/21 0809  ?BP: (!) 151/99 (!) 149/93  ?Pulse: 98 93  ?Resp:  12  ?Temp:  97.9 ?F (36.6 ?C)  ?SpO2: 99% 97%  ? ?Physical Exam ?Gen: drowsy, opens eyes to voice. Follows simple commands ?Head: L periorbital bruising ?Resp: CTAB, no w/r/r ?CV: RRR, no m/g/r; nml S1 and S2. 2+ symmetric peripheral pulses. ?  ?Neuro: ?*MS: lethargic, drowsy, to orientation questions, mumbles somewhat incoherently. Follows simple commands such as raising arms and wiggling toes. ?*Speech: no intelligible speech ?*CN: PERRL, EOMI, blinks to threat bilat, face symmetric ?*Motor & sensory: Withdraws to noxious stimuli in all 4 extremities, more briskly on L than R. Later was able to lift both uppers against gravity. Wiggles toes and tries to lift legs against gravity but unable to. ?*Coordination:  UTA ?*Reflexes:  2+ brisk and symmetric throughout without clonus; toes down-going bilat ?*Gait: deferred ?  ?ICH score = 1 ? ?Echo ?IMPRESSIONS  ? 1. Left ventricular ejection fraction, by estimation, is 55 to 60%. The  ?left ventricle has normal function. The left ventricle has no regional  ?wall motion abnormalities. Left ventricular diastolic parameters are  ?indeterminate.  ? 2. Right ventricular systolic function is normal. The right ventricular  ?size is normal. There is normal pulmonary artery systolic pressure.  ? 3. The mitral valve is degenerative. No evidence of mitral valve  ?regurgitation.  ? 4. The aortic valve was not well visualized. Aortic valve regurgitation  ?is not visualized.  ? 5. The inferior vena cava is normal in size with greater than 50%  ?respiratory variability, suggesting right  atrial pressure of 3 mmHg.  ? 6.  Inadequate saline contrast study.  ?NORMAL LA SIZE ? ? ?A/P:  ?71 yo man with HTN, HL, CAD, suspected baseline cognitive impairment who lives alone, brought in by family for encephalopathy and found to have extensive multicompartment hemorrhage (IPH, SAH, SDH, IVH) with evidence of head trauma. No vascular abnl identified on CTA. Head CT at 6 hrs was stable. He has multifocal ischemic infarcts on MRI brain, suggesting central embolic source. He is clearly not a candidate for anticoagulation at this time if he were found to have a fib.Marland Kitchen He is very lethargic today although repeat head CT done 2 days ago was stable. Keppra empirically to 1000mg  bid 2/2 suspicion for nonconvulsive sz (he has not had any witnessed seizures to date - 500mg  bid was for prophylaxis). EEG with n sz. ?Decreased level of consciousness likely due to multifocal strokes with HT. ? ?-  goal SBP <160 ?- TTE ?- SCDs for DVT prophylaxis-no antiplatelets or anticoag ?- Decrease keppra to 500 BID - may be contributing to sedation ?- HOB elevated 30 degrees ?- Frequent neurochecks per step-down protocol ?- STAT head CT for any change in exam ?- NSU following; neurology will continue to follow as well ? ?Addendum ?After discussion with the teams, family has provided more information about his declining health, cachexia etc.  The look of the strokes is very atypical.  I would complete the work-up with MRI of the brain with contrast as well as pan scanning for any evidence of malignancy to rule out hypercoagulable state due to malignancy so that detailed conversations with family can be hard. ?I would also hold off on the statin for right now given the bleeds. ?  ?-- ? , MD ?Neurologist ?[facility] ?Pager: [phone] ? ?

## 2021-05-25 NOTE — Progress Notes (Signed)
SLP Cancellation Note ? ?Patient Details ?Name: [name] ?MRN: [id number] ?DOB: 1950/11/18 ? ? ?Cancelled treatment:       Reason Eval/Treat Not Completed: Fatigue/lethargy limiting ability to participate (chart reviewed, consulted NSG. Pt has taken pills intermittently today w/ NSG but no other po intake. He has been lethargic and sleeping much of the day.) ?ST services will hold on tx session today and monitor pt's status and Palliative Care notes for GOC over the next 1-2 days. Recommend strict aspiration precautions w/ any oral intake.  ? ? ? ? ?Jerilynn Som, MS, CCC-SLP ?Speech Language Pathologist ?Rehab Services; [facility] [address] [facility] [place] ?[phone] (ascom) ?Tauren Delbuono ?05/25/2021, 2:32 PM ?

## 2021-05-25 NOTE — Progress Notes (Signed)
PT Cancellation Note ? ?Patient Details ?Name: [name] ?MRN: [id number] ?DOB: Dec 18, 1950 ? ? ?Cancelled Treatment:    Reason Eval/Treat Not Completed: Other (comment). Pt with family, RN and MD at bedside, PT to re-attempt as appropriate.  ? ?Olga Coaster PT, DPT ?10:04 AM,05/25/21 ? ?

## 2021-05-25 NOTE — Progress Notes (Signed)
Unable to do NIH on patient. Patient is lethargic, sleepy and weak. ?

## 2021-05-25 NOTE — Progress Notes (Signed)
PT Cancellation Note ? ?Patient Details ?Name: [name] ?MRN: [id number] ?DOB: [birth year] ? ? ?Cancelled Treatment:    Reason Eval/Treat Not Completed: Other (comment). Pt lethargic, did not respond to PT in room. Per nursing staff and chart review, pt pending goals of care to be established. PT to re-attempt as appropriate.  ? ? ?Olga Coaster PT, DPT ?2:21 PM,05/25/21 ? ?

## 2021-05-25 NOTE — Consult Note (Signed)
? ?                                                                                ?Consultation Note ?Date: 05/25/2021  ? ?Patient Name: [name]  ?DOB: Jun 09, 1950  MRN: [id number]  Age / Sex: 71 y.o., male  ?PCP: Leonides Sake, MD ?Referring Physician: Jennye Boroughs, MD ? ?Reason for Consultation: Establishing goals of care ? ?HPI/Patient Profile: 71 y.o. male  with past medical history of CVA, HTN, HLD, CAD, AAA, hepatic steatosis, hospitalized Jan 2023 with fall and UTI admitted on 05/20/2021 with found by family on floor covered in urine/feces and bruising to left eye with estimated downtime potentially days. CT head with multi-compartmental intracranial hemorrhage, SAH, chronic small left SDH. ? ?Clinical Assessment and Goals of Care: ?I met today at [facility] bedside along with siblings [name] and [name]. I have reviewed records and scans. I updated [name] and [name] that [name] is not doing well and I am worried about his condition. [name] reports that [name]'s daughter [name] was awaiting call to discuss rehab options. Siblings note written on board in room plans for discharge in 1-3 days. I explain that this is a moving target and right now [name] isn't really able to participate with therapy or follow commands. Siblings report that he has been declining over the past year. [name] reports that he drinks alcohol and does not eat and has had significant weight loss and has had multiple falls that they know of (noted weight of 155 lbs March 2022 and 135 lbs June 2022 and now signs of severe muscle wasting and BMI 17).  ? ?I called and spoke with daughter, [name]. [name] is his next of kin and only child. [name] lives in [place] but was here to visit with her father [name]. [name] reports that he was very agitated when initially hospitalized but since Sunday (still not verbal of interactive with family) has really just been sleeping and not responsive. We discussed that he is not really eating/drinking and RN  reports struggles even getting him to take his medications and pocketing medications at times. We discussed his frail status prior to this acute illness/injury does not set him up for great recovery. [name] reports significant decline since Nov/Dec and that family has been discussing preparations for POA. I discussed with [name] concern for his overall condition and struggles moving forward. We discussed quality of life and the importance of considering how much we put [name] through moving forward knowing his trajectory of decline. We discussed code status and [name] agrees with DNR status at this time. We discussed feeding tubes and [name] does not feel that her father would want a feeding tube. We discussed the potential of hospice if we are not pursuing aggressive life prolonging measures and he does not show further improvements. [name] is open to hospice if this is our recommendation. I encouraged [name] to take some time to process and speak with the rest of her family and we can continue conversation tomorrow. I also provided [name] with my contact information.  ? ?All questions/concerns addressed. Emotional support provided.  ? ?Primary Decision Maker ?NEXT OF KIN daughter [name] ?  ? ?SUMMARY  OF RECOMMENDATIONS   ?-DNR decided ?- Comfort and hospice being considered ?- Ongoing conversations with family ? ?Code Status/Advance Care Planning: ?DNR ? ? ?Symptom Management:  ?Per attending, neurology.  ? ?Palliative Prophylaxis:  ?Aspiration, Bowel Regimen, Delirium Protocol, Frequent Pain Assessment, Oral Care, and Turn Reposition ? ?Prognosis:  ?Overall prognosis poor with multi-compartment hemorrhage and head trauma in the setting of underlying failure to thrive.  ? ?Discharge Planning: To Be Determined  ? ?  ? ?Primary Diagnoses: ?Present on Admission: ? Intraparenchymal hemorrhage of brain ([place]) ? Coronary artery disease involving native coronary artery of native heart without angina pectoris ? Essential  hypertension ? AAA (abdominal aortic aneurysm) without rupture ([place]) ? Sinusitis, chronic ? Embolic stroke ([place]) ? ? ?I have reviewed the medical record, interviewed the patient and family, and examined the patient. The following aspects are pertinent. ? ?Past Medical History:  ?Diagnosis Date  ? AAA (abdominal aortic aneurysm) without rupture ([place]) 04/29/2021  ? CT in Jan 2023: 4.4. infrarenal abdominal aortic aneurysm   ? Cerebrovascular disease   ? Coronary artery disease   ? Dyslipidemia   ? Hypertension   ? Pain in joint, shoulder region   ? ?Social History  ? ?Socioeconomic History  ? Marital status: Widowed  ?  Spouse name: Not on file  ? Number of children: Not on file  ? Years of education: Not on file  ? Highest education level: Not on file  ?Occupational History  ? Occupation: Engineer, drilling  ?  Comment: [employer]  ?Tobacco Use  ? Smoking status: Every Day  ?  Types: E-cigarettes  ? Smokeless tobacco: Never  ? Tobacco comments:  ?  Smokes electronic ciggarettes  ?Substance and Sexual Activity  ? Alcohol use: Yes  ?  Alcohol/week: 21.0 standard drinks  ?  Types: 21 Shots of liquor per week  ? Drug use: Never  ? Sexual activity: Not on file  ?Other Topics Concern  ? Not on file  ?Social History Narrative  ? Not on file  ? ?Social Determinants of Health  ? ?Financial Resource Strain: Not on file  ?Food Insecurity: Not on file  ?Transportation Needs: Not on file  ?Physical Activity: Not on file  ?Stress: Not on file  ?Social Connections: Not on file  ? ?Family History  ?Problem Relation Age of Onset  ? Alzheimer's disease Mother   ? Heart disease Father   ? ?Scheduled Meds: ?  stroke: early stages of recovery book   Does not apply Once  ? amLODipine  5 mg Oral Daily  ? amoxicillin-clavulanate  1 tablet Oral Q12H  ? feeding supplement  237 mL Oral BID BM  ? isosorbide mononitrate  30 mg Oral Daily  ? levETIRAcetam  1,000 mg Oral BID  ? metoprolol succinate  50 mg Oral Daily  ? multivitamin with minerals  1 tablet  Oral Daily  ? nicotine  21 mg Transdermal Daily  ? vitamin B-12  500 mcg Oral Daily  ? Vitamin D (Ergocalciferol)  50,000 Units Oral Q7 days  ? ?Continuous Infusions: ?PRN Meds:.acetaminophen **OR** acetaminophen (TYLENOL) oral liquid 160 mg/5 mL **OR** acetaminophen, haloperidol lactate, LORazepam ?No Known Allergies ?Review of Systems  ?Unable to perform ROS: Acuity of condition  ? ?Physical Exam ?Vitals and nursing note reviewed.  ?Constitutional:   ?   Appearance: He is cachectic. He is ill-appearing.  ?Cardiovascular:  ?   Rate and Rhythm: Normal rate.  ?Pulmonary:  ?   Effort: No tachypnea, accessory muscle usage  or respiratory distress.  ?Abdominal:  ?   General: Abdomen is flat.  ?   Palpations: Abdomen is soft.  ?Neurological:  ?   Mental Status: He is lethargic.  ?   Comments: Opens eyes and makes eye contact briefly. He did attempt to mumble something one time but otherwise no attempts to verbalize. Does not follow commands.   ? ? ?Vital Signs: BP (!) 149/93 (BP Location: Right Arm)   Pulse 93   Temp 97.9 ?F (36.6 ?C)   Resp 12   Ht _0  (1.778 m)   Wt 54.4 kg   SpO2 97%   BMI 17.22 kg/m?  ?Pain Scale: PAINAD ?  ?Pain Score: Asleep ? ? ?SpO2: SpO2: 97 % ?O2 Device:SpO2: 97 % ?O2 Flow Rate: .  ? ?IO: Intake/output summary:  ?Intake/Output Summary (Last 24 hours) at 05/25/2021 0853 ?Last data filed at 05/25/2021 0545 ?Gross per 24 hour  ?Intake 0 ml  ?Output 1400 ml  ?Net -1400 ml  ? ? ?LBM: Last BM Date : 05/23/21 ?Baseline Weight: Weight: 54.4 kg ?Most recent weight: Weight: 54.4 kg     ?Palliative Assessment/Data: ? ? ? ? ?Time In: 1000 ? ?Time Total: 80 min ? ?Greater than 50%  of this time was spent counseling and coordinating care related to the above assessment and plan. ? ?Signed by: ?Vinie Sill, NP ?Palliative Medicine Team ?Pager # [phone] (M-F 8a-5p) ?Team Phone # [phone] (Nights/Weekends) ?  ? ? ? ? ? ? ? ? ? ? ? ? ?  ?

## 2021-05-25 NOTE — Progress Notes (Addendum)
? ? ? ?Progress Note  ? ? ?[name]  TMA:[id number] DOB: Jun 20, 1950  DOA: 05/20/2021 ?PCP: Ailene Ravel, MD  ? ? ? ? ?Brief Narrative:  ? ? ?Medical records reviewed and are as summarized below: ? ?[name] is a 71 y.o. male with medical history significant for hypertension, hyperlipidemia, CAD, alcohol use disorder, history of falls, recently hospitalized about 2 months prior to admission from 01/30/2021 through 02/03/2021 for severe sepsis secondary to UTI and a fall, cognitive impairment, severe malnutrition.  He was brought to the hospital because of altered mental status.  Reportedly, he was found down at home covered with urine and feces. ? ?He was found to have intracranial hemorrhage (intraparenchymal and subdural hemorrhage) and fracture of right inferior orbital wall.  MRI brain showed embolic stroke.  He is not recommended off of antiplatelet or anticoagulation because of intracranial hemorrhage. ? ? ? ? ?Assessment/Plan:  ? ?Principal Problem: ?  Fall at home, initial encounter ?Active Problems: ?  Intraparenchymal hemorrhage of brain (H[facility]) ?  Subdural hemorrhage (H[facility]) ?  Sinusitis, chronic ?  Hematoma of frontal scalp ?  Fracture of inferior orbital wall, right (HCC) ?  Coronary artery disease involving native coronary artery of native heart without angina pectoris ?  AAA (abdominal aortic aneurysm) without rupture ([facility]) ?  Cognitive deficits ?  Essential hypertension ?  Periorbital contusion, left, initial encounter ?  Embolic stroke ([facility]) ?  Malnutrition of moderate degree ? ? ? ?Body mass index is 17.22 kg/m?. ? ?S/p fall, Intracranial hemorrhage (subdural hemorrhage, intraparenchymal hemorrhage, subarachnoid hemorrhage, intraventricular), embolic infarcts/stroke, acute metabolic encephalopathy/lethargy:  ?MRI brain showed small acute infarct involving the anterior and posterior left frontal lobe, left body of the corpus callosum and right posterior pons/cerebellar peduncle.  Repeat CT  head on 05/23/2021 did not show any progression of intracranial bleed (this was done because of reported drowsiness) ?Continue Keppra for seizure prophylaxis (dose has been decreased from 1000 mg to 500 mg twice daily because of lethargy ?No seizures or epileptiform discharges noted on EEG.  EEG suggestive of moderate to severe diffuse encephalopathy. ?Case discussed with Dr. Wilford Corner, neurologist.  He was wondering whether this pattern of intracranial bleed could be from malignancy.  Recommended MRI brain with contrast and CT chest, abdomen and pelvis with contrast to evaluate for malignancy.  Hopefully, this will help family make decision regarding plan of care.  Case discussed with [name], daughter who is agreeable with additional imaging. ?Follow-up with neurologist for further recommendations. ? ?Orthostatic hypotension: Improved ? ?Sinus tachycardia, s/p SVT on 05/24/2021 during physical therapy: Continue metoprolol ? ?Right inferior orbital wall fracture: Case discussed with Dr. Druscilla Brownie on 05/21/2021.  Outpatient follow-up with ophthalmologist recommended. ? ?Chronic sinusitis, hemosinus: Continue Augmentin. Case was discussed with Dr. Andee Poles, ENT surgeon on-call on 05/21/2021 outpatient follow-up with ENT physician recommended ? ?Hypokalemia: Improved ? ?Dysphagia: Speech therapist recommended dysphagia 1 diet. ? ?AAA without rupture: This was 4.4 cm on CT on 01/30/2021.  Outpatient follow-up with vascular surgeon/PCP recommended ? ? ?Other comorbidities include hypertension, CAD, cognitive impairment, alcohol use disorder and history of multiple falls according to his brother and sister ? ?Appreciate input from palliative care team.  CODE STATUS has been changed to DNR ? ?Diet Order   ? ?       ?  DIET - DYS 1 Room service appropriate? Yes with Assist; Fluid consistency: Thin  Diet effective now       ?  ? ?  ?  ? ?  ? ? ? ? ? ? ? ? ?  Consultants: ?Neurologist ?Neurosurgeon ? ?Procedures: ?None ? ? ? ?Medications:   ? ?  stroke: early stages of recovery book   Does not apply Once  ? amLODipine  5 mg Oral Daily  ? amoxicillin-clavulanate  1 tablet Oral Q12H  ? feeding supplement  237 mL Oral BID BM  ? isosorbide mononitrate  20 mg Oral BID  ? levETIRAcetam  500 mg Oral BID  ? metoprolol tartrate  25 mg Oral BID  ? multivitamin with minerals  1 tablet Oral Daily  ? nicotine  21 mg Transdermal Daily  ? vitamin B-12  500 mcg Oral Daily  ? Vitamin D (Ergocalciferol)  50,000 Units Oral Q7 days  ? ?Continuous Infusions: ? ? ? ? ?Anti-infectives (From admission, onward)  ? ? Start     Dose/Rate Route Frequency Ordered Stop  ? 05/22/21 1015  amoxicillin-clavulanate (AUGMENTIN) 875-125 MG per tablet 1 tablet       ? 1 tablet Oral Every 12 hours 05/22/21 0920 05/29/21 0959  ? 05/22/21 1000  Ampicillin-Sulbactam (UNASYN) 3 g in sodium chloride 0.9 % 100 mL IVPB  Status:  Discontinued       ? 3 g ?200 mL/hr over 30 Minutes Intravenous Every 6 hours 05/22/21 0851 05/22/21 0920  ? ?  ? ? ? ? ? ? ? ? ? ?Family Communication/Anticipated D/C date and plan/Code Status  ? ?DVT prophylaxis: SCD's Start: 05/21/21 0108 ? ? ?  Code Status: DNR ? ?Family Communication: Plan discussed with [name] (brother) ?[name] (sister) at the bedside.  Plan was discussed with [name], daughter, over the phone. ?Disposition Plan: Plan to discharge to SNF ? ? ?Status is: Inpatient ?Remains inpatient appropriate because: Awaiting placement to SNF ? ? ? ? ? ? ?Subjective:  ? ?Interval events noted.  She is confused and unable to provide any history.  [name], brother and [name], sister, were at the bedside. ? ?Objective:  ? ? ?Vitals:  ? 05/24/21 2354 05/25/21 0519 05/25/21 0809 05/25/21 1158  ?BP: (!) 155/97 (!) 151/99 (!) 149/93 98/68  ?Pulse: (!) 102 98 93 (!) 106  ?Resp: 17  14 14   ?Temp: 98.4 ?F (36.9 ?C)  97.9 ?F (36.6 ?C) 97.6 ?F (36.4 ?C)  ?TempSrc:      ?SpO2: 100% 99% 97% 98%  ?Weight:      ?Height:      ? ?No data found. ? ? ?Intake/Output Summary (Last 24  hours) at 05/25/2021 1320 ?Last data filed at 05/25/2021 0545 ?Gross per 24 hour  ?Intake 0 ml  ?Output 800 ml  ?Net -800 ml  ? ? ?Filed Weights  ? 05/20/21 1606  ?Weight: 54.4 kg  ? ? ?Exam: ? ?GEN: NAD ?SKIN: No rash ?EYES: EOMI ?ENT: MMM ?CV: RRR ?PULM: CTA B ?ABD: soft, ND, NT, +BS ?CNS: Drowsy but arousable, confused ?EXT: No edema or tenderness ? ? ? ? ? ? ?Data Reviewed:  ? ?I have personally reviewed following labs and imaging studies: ? ?Labs: ?Labs show the following:  ? ?Basic Metabolic Panel: ?Recent Labs  ?Lab 05/20/21 ?1616 05/21/21 ?2315 05/22/21 ?05/24/21 05/25/21 ?0410  ?NA 137 135 134* 135  ?K 3.2* 3.2* 3.8 3.8  ?CL 98 104 102 101  ?CO2 28 24 23 25   ?GLUCOSE 154* 96 107* 132*  ?BUN 16 7* 7* 14  ?CREATININE 0.58* 0.53* 0.60* 0.52*  ?CALCIUM 9.8 8.7* 8.9 9.6  ?MG  --  1.9 1.9  --   ?PHOS  --  2.7 2.5  --   ? ?  GFR ?Estimated Creatinine Clearance: 64.2 mL/min (A) (by C-G formula based on SCr of 0.52 mg/dL (L)). ?Liver Function Tests: ?Recent Labs  ?Lab 05/20/21 ?1616  ?AST 50*  ?ALT 32  ?ALKPHOS 92  ?BILITOT 0.8  ?PROT 8.4*  ?ALBUMIN 4.2  ? ?No results for input(s): LIPASE, AMYLASE in the last 168 hours. ?No results for input(s): AMMONIA in the last 168 hours. ?Coagulation profile ?No results for input(s): INR, PROTIME in the last 168 hours. ? ?CBC: ?Recent Labs  ?Lab 05/20/21 ?1616 05/21/21 ?2315 05/22/21 ?[id number] 05/25/21 ?0410  ?WBC 13.5* 7.7 8.5 11.0*  ?NEUTROABS 10.5*  --   --   --   ?HGB 12.6* 10.7* 11.5* 12.5*  ?HCT 37.5* 31.9* 34.4* 37.0*  ?MCV 97.7 95.2 95.3 94.9  ?PLT 203 155 174 263  ? ?Cardiac Enzymes: ?Recent Labs  ?Lab 05/20/21 ?1616  ?CKTOTAL 94  ? ?BNP (last 3 results) ?No results for input(s): PROBNP in the last 8760 hours. ?CBG: ?Recent Labs  ?Lab 05/24/21 ?1150 05/24/21 ?2024 05/25/21 ?[id number] 05/25/21 ?[id number] 05/25/21 ?1200  ?GLUCAP 175* 121* 125* 126* 172*  ? ?D-Dimer: ?No results for input(s): DDIMER in the last 72 hours. ?Hgb A1c: ?No results for input(s): HGBA1C in the last 72 hours. ? ?Lipid  Profile: ?No results for input(s): CHOL, HDL, LDLCALC, TRIG, CHOLHDL, LDLDIRECT in the last 72 hours. ? ?Thyroid function studies: ?No results for input(s): TSH, T4TOTAL, T3FREE, THYROIDAB in the last 72 hou

## 2021-05-26 DIAGNOSIS — I10 Essential (primary) hypertension: Secondary | ICD-10-CM

## 2021-05-26 LAB — GLUCOSE, CAPILLARY
Glucose-Capillary: 102 mg/dL — ABNORMAL HIGH (ref 70–99)
Glucose-Capillary: 117 mg/dL — ABNORMAL HIGH (ref 70–99)
Glucose-Capillary: 127 mg/dL — ABNORMAL HIGH (ref 70–99)
Glucose-Capillary: 155 mg/dL — ABNORMAL HIGH (ref 70–99)

## 2021-05-26 MED ORDER — IPRATROPIUM-ALBUTEROL 0.5-2.5 (3) MG/3ML IN SOLN: 3.0000 mL | RESPIRATORY_TRACT | Status: DC | PRN

## 2021-05-26 MED ORDER — GLYCOPYRROLATE 1 MG PO TABS
1.0000 mg | ORAL_TABLET | ORAL | Status: DC | PRN
  Filled 2021-05-26: qty 1

## 2021-05-26 MED ORDER — ONDANSETRON HCL 4 MG/2ML IJ SOLN
4.0000 mg | Freq: Four times a day (QID) | INTRAMUSCULAR | Status: DC | PRN
Start: 2021-05-26 — End: 2021-05-27

## 2021-05-26 MED ORDER — LEVETIRACETAM IN NACL 500 MG/100ML IV SOLN
500.0000 mg | Freq: Two times a day (BID) | INTRAVENOUS | Status: DC
  Administered 2021-05-26 – 2021-05-27 (×3): 500 mg via INTRAVENOUS
  Filled 2021-05-26 (×3): qty 100

## 2021-05-26 MED ORDER — ONDANSETRON 4 MG PO TBDP: 4.0000 mg | ORAL_TABLET | Freq: Four times a day (QID) | ORAL | Status: DC | PRN

## 2021-05-26 MED ORDER — POLYVINYL ALCOHOL 1.4 % OP SOLN: 1.0000 [drp] | Freq: Four times a day (QID) | OPHTHALMIC | Status: DC | PRN

## 2021-05-26 MED ORDER — BIOTENE DRY MOUTH MT LIQD: 15.0000 mL | OROMUCOSAL | Status: DC | PRN

## 2021-05-26 MED ORDER — MORPHINE SULFATE (CONCENTRATE) 10 MG/0.5ML PO SOLN: 5.0000 mg | ORAL | Status: DC | PRN

## 2021-05-26 MED ORDER — MORPHINE SULFATE (PF) 2 MG/ML IV SOLN: 1.0000 mg | INTRAVENOUS | Status: DC | PRN

## 2021-05-26 MED ORDER — GLYCOPYRROLATE 0.2 MG/ML IJ SOLN: 0.2000 mg | INTRAMUSCULAR | Status: DC | PRN

## 2021-05-26 MED ORDER — LORAZEPAM 2 MG/ML IJ SOLN: 1.0000 mg | INTRAMUSCULAR | Status: DC | PRN

## 2021-05-26 NOTE — Progress Notes (Signed)
Palliative: ? ?HPI: 71 y.o. male  with past medical history of CVA, HTN, HLD, CAD, AAA, hepatic steatosis, hospitalized Jan 2023 with fall and UTI admitted on 05/20/2021 with found by family on floor covered in urine/feces and bruising to left eye with estimated downtime potentially days. CT head with multi-compartmental intracranial hemorrhage, SAH, chronic small left SDH. ? ?I met today at [facility] bedside. He is much unchanged from yesterday although a little more restless. He does make purposeful movement such as placing arm behind his head but he does not make eye contact as he did yesterday or follow commands for me. He is not responding much to his family (brother [name] and [name]'s wife [name] at bedside) either. [name] makes a rubbing motion on bed and [name] wonders if he is making this action as he used to rub his wife's arm like that. His wife died ~2 years ago and family report that his alcohol use increased significantly after her passing. [name] asks about surgical intervention and we discussed that there is really not any surgery options and I explained that surgery is not really a good option due to the multiple areas in the brain effected and injured. [name] asks about hospice and I explained that this is the conversation I have had with [name]'s daughter (she gives permission for [place] to share information with [name]'s siblings). [name] expresses understanding of poor prognosis and plans to discuss more with [name] and family about hospice location they would prefer if [name] decides to proceed with hospice.  ? ?I called and discussed with daughter, [name]. Unfortunately she has just began a new job in [employer] and unable to be physically present at current time but is readily available by phone. I discussed with her my visit and assessment of her father today as well as conversations I have had with hospitalist and neurologist. We reviewed scans from yesterday. We discussed his overall poor prognosis and paths forward.  [name] does not want artificial feeding and aggressive care moving forward and opts for comfort care at this time. She does not want her father to suffer. She agrees to placement at hospice facility for end of life care.  ? ?All questions/concerns addressed. Emotional support provided. Updated medical team and TOC.  ? ?Exam: Somnolent. Cachectic with severe muscle wasting. Restless and moving around in bed but not trying to get out of bed. Reaching at times and seems to be delirious or possible hallucinations. Breathing regular, unlabored. Barrel chested. Abd soft. Moves all extremities. Does not follow commands. He does not make eye contact today and very briefly opened eyes but not to command.  ? ?Plan: ?- DNR ?- Full comfort care (orders placed) ?- Transition to hospice facility  ?- Prognosis < 2 weeks ? ?55 min ? ?Omar Sill, NP ?Palliative Medicine Team ?Pager [phone] (Please see [email or web address] for schedule) ?Team Phone [phone]  ? ? ?Greater than 50%  of this time was spent counseling and coordinating care related to the above assessment and plan   ?

## 2021-05-26 NOTE — Plan of Care (Signed)
Pt is very somnolent but did become more responsive with stimulation.  Unable to perform NIH and neuro checks because pt can't follow commands.  He was alert enough to take meds crushed in orange sherbet and seemed to like it. Did witness purposeful movement in arms and legs.  Pt has unintelligible speech but nodded appropriately to indicate he wanted more bites and something to drink, and also able to tell me his last name.  ?

## 2021-05-26 NOTE — Progress Notes (Signed)
Seen and examined this morning.  Remains very lethargic.  Spontaneously opens eyes but does not follow commands. ?Palliative medicine having conversations with family regarding goals of care and have notified me that they have decided to proceed with comfort measures only given gradual decline over the last year as well as other chronic comorbidities.  His large amount of strokes with hemorrhagic transformation would make neurologically meaningful recovery incomplete and prolonged at best.  I agree with the decisions made with the family and I will be available with further questions as needed. ? ?-- ?Milon Dikes, MD ?Neurologist ?[facility] ?Pager: [phone] ? ?No charge note ?

## 2021-05-26 NOTE — Progress Notes (Signed)
PT Cancellation Note ? ?Patient Details ?Name: DEKARI BURES ?MRN: [id number] ?DOB: 02/27/1950 ? ? ?Cancelled Treatment:    Reason Eval/Treat Not Completed: Other (comment). PT orders completed at this time, PT to sign off.  ? ? ?Olga Coaster PT, DPT ?12:59 PM,05/26/21 ? ?

## 2021-05-26 NOTE — Progress Notes (Deleted)
Cardiology Office Note  Date:  05/26/2021   ID:  [name], DOB [birth year], MRN [id number]  PCP:  Leonides Sake, MD   No chief complaint on file.   HPI:  [name] is a 71 y.o. male  with a history of  CAD (s/p PTCA/stent of the RCA,  cutting angioplasty of the LAD in 2006),  PAD, s/p bilateral CEA) and dyslipidemia.  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  Echocardiogram performed May 23, 2021 ejection fraction 55 to 60%, no significant valvular heart disease  PMH:   has a past medical history of AAA (abdominal aortic aneurysm) without rupture ([place]) (04/29/2021), Cerebrovascular disease, Coronary artery disease, Dyslipidemia, Hypertension, and Pain in joint, shoulder region.  PSH:    Past Surgical History:  Procedure Laterality Date   CAROTID ENDARTERECTOMY     left/Jan. 2006/ right;Dacron patch angioplasty   PTCA     stent placement January 2006    No current facility-administered medications for this visit.   No current outpatient medications on file.   Facility-Administered Medications Ordered in Other Visits  Medication Dose Route Frequency Provider Last Rate Last Admin    stroke: early stages of recovery book   Does not apply Once Athena Masse, MD       acetaminophen (TYLENOL) tablet 650 mg  650 mg Oral Q4H PRN Athena Masse, MD       Or   acetaminophen (TYLENOL) 160 MG/5ML solution 650 mg  650 mg Per Tube Q4H PRN Athena Masse, MD       Or   acetaminophen (TYLENOL) suppository 650 mg  650 mg Rectal Q4H PRN Athena Masse, MD       amLODipine (NORVASC) tablet 5 mg  5 mg Oral Daily Jennye Boroughs, MD   5 mg at 05/25/21 1023   amoxicillin-clavulanate (AUGMENTIN) 875-125 MG per tablet 1 tablet  1 tablet Oral Q12H Jennye Boroughs, MD   1 tablet at 05/25/21 2230   feeding supplement (ENSURE ENLIVE /  ENSURE PLUS) liquid 237 mL  237 mL Oral BID BM Jennye Boroughs, MD   237 mL at 05/25/21 1024   haloperidol lactate (HALDOL) injection 5 mg  5 mg Intramuscular Q6H PRN Jennye Boroughs, MD   5 mg at 05/24/21 1340   ipratropium-albuterol (DUONEB) 0.5-2.5 (3) MG/3ML nebulizer solution 3 mL  3 mL Nebulization Q4H PRN Sreenath, Sudheer B, MD       isosorbide mononitrate (ISMO) tablet 20 mg  20 mg Oral BID Jennye Boroughs, MD   20 mg at 05/25/21 2251   levETIRAcetam (KEPPRA) IVPB 500 mg/100 mL premix  500 mg Intravenous Q12H Sreenath, Sudheer B, MD       LORazepam (ATIVAN) injection 0.5 mg  0.5 mg Intravenous Once PRN Derek Jack, MD       metoprolol tartrate (LOPRESSOR) tablet 25 mg  25 mg Oral BID Jennye Boroughs, MD   25 mg at 05/25/21 2231   multivitamin with minerals tablet 1 tablet  1 tablet Oral Daily Jennye Boroughs, MD   1 tablet at 05/25/21 1023   nicotine (NICODERM CQ - dosed in mg/24 hours) patch 21 mg  21 mg Transdermal Daily Jennye Boroughs, MD   21 mg at 05/26/21 [id number]   vitamin B-12 (CYANOCOBALAMIN) tablet 500 mcg  500 mcg Oral Daily Jennye Boroughs, MD  500 mcg at 05/25/21 1024   Vitamin D (Ergocalciferol) (DRISDOL) capsule 50,000 Units  50,000 Units Oral Q7 days Jennye Boroughs, MD   50,000 Units at 05/23/21 [id number]     Allergies:   Patient has no known allergies.   Social History:  The patient  reports that he has been smoking e-cigarettes. He has never used smokeless tobacco. He reports current alcohol use of about 21.0 standard drinks per week. He reports that he does not use drugs.   Family History:   family history includes Alzheimer's disease in his mother; Heart disease in his father.    Review of Systems: ROS   PHYSICAL EXAM: VS:  There were no vitals taken for this visit. , BMI There is no height or weight on file to calculate BMI. GEN: Well nourished, well developed, in no acute distress HEENT: normal Neck: no JVD, carotid bruits, or masses Cardiac: RRR; no murmurs, rubs,  or gallops,no edema  Respiratory:  clear to auscultation bilaterally, normal work of breathing GI: soft, nontender, nondistended, + BS MS: no deformity or atrophy Skin: warm and dry, no rash Neuro:  Strength and sensation are intact Psych: euthymic mood, full affect    Recent Labs: 01/31/2021: TSH 0.988 05/20/2021: ALT 32 05/22/2021: Magnesium 1.9 05/25/2021: BUN 14; Creatinine, Ser 0.52; Hemoglobin 12.5; Platelets 263; Potassium 3.8; Sodium 135    Lipid Panel Lab Results  Component Value Date   CHOL 195 05/20/2021   HDL 87 05/20/2021   LDLCALC 83 05/20/2021   TRIG 123 05/20/2021      Wt Readings from Last 3 Encounters:  05/20/21 120 lb (54.4 kg)  02/01/21 119 lb 0.8 oz (54 kg)  07/14/20 135 lb 6.4 oz (61.4 kg)       ASSESSMENT AND PLAN:  Problem List Items Addressed This Visit   None    Disposition:   F/U  12 months   Total encounter time more than 30 minutes  Greater than 50% was spent in counseling and coordination of care with the patient    Signed, Esmond Plants, M.D., Ph.D. [place], [place]

## 2021-05-26 NOTE — Care Management Important Message (Signed)
Important Message ? ?Patient Details  ?Name: [name] ?MRN: [id number] ?Date of Birth: December 18, 1950 ? ? ?Medicare Important Message Given:  Other (see comment) ? ?Patient is on comfort care and plans to discharge to the Hospice Home. Out of respect for the patient and family no Important Message from [facility] given. ? ? ?Olegario Messier A Enslie Sahota ?05/26/2021, 1:48 PM ?

## 2021-05-26 NOTE — Progress Notes (Signed)
?PROGRESS NOTE ? ? ? ?[name]  NOM:[id number] DOB: Jul 27, 1950 DOA: 05/20/2021 ?PCP: Ailene Ravel, MD  ? ? ?Brief Narrative:  ?71 y.o. male with medical history significant for hypertension, hyperlipidemia, CAD, alcohol use disorder, history of falls, recently hospitalized about 2 months prior to admission from 01/30/2021 through 02/03/2021 for severe sepsis secondary to UTI and a fall, cognitive impairment, severe malnutrition.  He was brought to the hospital because of altered mental status.  Reportedly, he was found down at home covered with urine and feces. ?  ?He was found to have intracranial hemorrhage (intraparenchymal and subdural hemorrhage) and fracture of right inferior orbital wall.  MRI brain showed embolic stroke.  He is not recommended off of antiplatelet or anticoagulation because of intracranial hemorrhage. ? ?MRI brain with and without contrast and CT chest abdomen pelvis performed.  No evidence of metastatic disease.  MRI brain did reveal stable but extensive intraparenchymal hemorrhage.  No evidence of progression. ? ? ?Assessment & Plan: ?  ?Principal Problem: ?  Fall at home, initial encounter ?Active Problems: ?  Intraparenchymal hemorrhage of brain (HCC) ?  Subdural hemorrhage (HCC) ?  Sinusitis, chronic ?  Hematoma of frontal scalp ?  Fracture of inferior orbital wall, right (HCC) ?  Coronary artery disease involving native coronary artery of native heart without angina pectoris ?  AAA (abdominal aortic aneurysm) without rupture (HCC) ?  Cognitive deficits ?  Essential hypertension ?  Periorbital contusion, left, initial encounter ?  Embolic stroke (HCC) ?  Malnutrition of moderate degree ? ?Status post falls with history of recurrent falls ?Extensive intracranial hemorrhage ?History of embolic infarct/stroke ?Acute metabolic encephalopathy ?Initial MRI brain done without contrast demonstrated acute small infarct ?Repeat head CT without progression ?MRI and CT chest abdomen pelvis  performed ?No evidence of metastatic disease ?EEG negative ?Plan: ?Continue Keppra for seizure prophylaxis 500 mg IV twice daily ?We will need to do IV as patient is not able to tolerate p.o. at this time ?Mental status remains poor, continue to monitor closely ? ?Sinus tachycardia ?PSVT ?Stable ?Continue metoprolol if able to tolerate p.o. ? ?Right inferior orbital wall fracture ?Case was discussed with ophthalmology ?Outpatient follow-up recommended if patient improves ? ?Dysphagia ?Dysphagia 1 diet when able to tolerate p.o. as mental status improves ? ?AAA ?Stable on imaging ?No evidence of rupture ? ?Hypokalemia ?Improved ? ?Chronic conditions: ?Coronary artery disease, hypertension, cognitive impairment, alcohol use disorder, history of falls ? ? ?DVT prophylaxis: SCD ?Code Status: DNR ?Family Communication:None today.  Palliative care service to engage with family ?Disposition Plan: Status is: Inpatient ?Remains inpatient appropriate because: Intraparenchymal hemorrhage.  Hemorrhagic CVA.  Prognosis guarded.  Neurology and palliative care following.  At this time patient likely appropriate for transition to comfort measures. ? ? ?Level of care: Med-Surg ? ?Consultants:  ?Neurology ?Palliative care ? ?Procedures:  ?None ? ?Antimicrobials: ?None ? ? ?Subjective: ?Patient seen and examined.  Lethargic.  Verbally unresponsive.  Unable to participate in interview.  Does not follow commands. ? ?Objective: ?Vitals:  ? 05/26/21 0024 05/26/21 9628 05/26/21 0746 05/26/21 0756  ?BP: 114/85 124/90 (!) 134/95 (!) 134/93  ?Pulse: 93 (!) 103 (!) 101 (!) 102  ?Resp: 18 18 16 16   ?Temp: (!) 97.5 ?F (36.4 ?C) 98.1 ?F (36.7 ?C) 97.6 ?F (36.4 ?C)   ?TempSrc: Oral Oral Oral   ?SpO2: 100% 98% 98% 99%  ?Weight:      ?Height:      ? ? ?Intake/Output Summary (Last 24 hours)  at 05/26/2021 1119 ?Last data filed at 05/26/2021 0445 ?Gross per 24 hour  ?Intake --  ?Output 600 ml  ?Net -600 ml  ? ?Filed Weights  ? 05/20/21 1606  ?Weight: 54.4  kg  ? ? ?Examination: ? ?General exam: Appears frail and chronically ill.  Verbally unresponsive.  Does not follow commands ?Respiratory system: Poor respiratory effort.  Bibasilar crackles.  Normal work of breathing.  Room air ?Cardiovascular system: S1-S2, tachycardic, regular rhythm, no murmurs ?Gastrointestinal system: Thin, soft, NT/ND, normal bowel sounds ?Central nervous system: Alert, oriented x0 ?Extremities: Unable to assess power.  Muscle wasting noted bilaterally ?Skin: Thin and pale with no obvious rashes or lesions ?Psychiatry: Unable to assess ? ? ? ?Data Reviewed: I have personally reviewed following labs and imaging studies ? ?CBC: ?Recent Labs  ?Lab 05/20/21 ?1616 05/21/21 ?2315 05/22/21 ?6599 05/25/21 ?0410  ?WBC 13.5* 7.7 8.5 11.0*  ?NEUTROABS 10.5*  --   --   --   ?HGB 12.6* 10.7* 11.5* 12.5*  ?HCT 37.5* 31.9* 34.4* 37.0*  ?MCV 97.7 95.2 95.3 94.9  ?PLT 203 155 174 263  ? ?Basic Metabolic Panel: ?Recent Labs  ?Lab 05/20/21 ?1616 05/21/21 ?2315 05/22/21 ?3570 05/25/21 ?0410  ?NA 137 135 134* 135  ?K 3.2* 3.2* 3.8 3.8  ?CL 98 104 102 101  ?CO2 28 24 23 25   ?GLUCOSE 154* 96 107* 132*  ?BUN 16 7* 7* 14  ?CREATININE 0.58* 0.53* 0.60* 0.52*  ?CALCIUM 9.8 8.7* 8.9 9.6  ?MG  --  1.9 1.9  --   ?PHOS  --  2.7 2.5  --   ? ?GFR: ?Estimated Creatinine Clearance: 64.2 mL/min (A) (by C-G formula based on SCr of 0.52 mg/dL (L)). ?Liver Function Tests: ?Recent Labs  ?Lab 05/20/21 ?1616  ?AST 50*  ?ALT 32  ?ALKPHOS 92  ?BILITOT 0.8  ?PROT 8.4*  ?ALBUMIN 4.2  ? ?No results for input(s): LIPASE, AMYLASE in the last 168 hours. ?No results for input(s): AMMONIA in the last 168 hours. ?Coagulation Profile: ?No results for input(s): INR, PROTIME in the last 168 hours. ?Cardiac Enzymes: ?Recent Labs  ?Lab 05/20/21 ?1616  ?CKTOTAL 94  ? ?BNP (last 3 results) ?No results for input(s): PROBNP in the last 8760 hours. ?HbA1C: ?No results for input(s): HGBA1C in the last 72 hours. ?CBG: ?Recent Labs  ?Lab 05/25/21 ?1551  05/25/21 ?2008 05/26/21 ?07/26/21 05/26/21 ?07/26/21 05/26/21 ?0749  ?GLUCAP 109* 103* 155* 127* 117*  ? ?Lipid Profile: ?No results for input(s): CHOL, HDL, LDLCALC, TRIG, CHOLHDL, LDLDIRECT in the last 72 hours. ?Thyroid Function Tests: ?No results for input(s): TSH, T4TOTAL, FREET4, T3FREE, THYROIDAB in the last 72 hours. ?Anemia Panel: ?No results for input(s): VITAMINB12, FOLATE, FERRITIN, TIBC, IRON, RETICCTPCT in the last 72 hours. ?Sepsis Labs: ?Recent Labs  ?Lab 05/20/21 ?1610 05/21/21 ?1209  ?LATICACIDVEN 2.7* 1.0  ? ? ?Recent Results (from the past 240 hour(s))  ?Resp Panel by RT-PCR (Flu A&B, Covid) Nasopharyngeal Swab     Status: None  ? Collection Time: 05/20/21  7:24 PM  ? Specimen: Nasopharyngeal Swab; Nasopharyngeal(NP) swabs in vial transport medium  ?Result Value Ref Range Status  ? SARS Coronavirus 2 by RT PCR NEGATIVE NEGATIVE Final  ?  Comment: (NOTE) ?SARS-CoV-2 target nucleic acids are NOT DETECTED. ? ?The SARS-CoV-2 RNA is generally detectable in upper respiratory ?specimens during the acute phase of infection. The lowest ?concentration of SARS-CoV-2 viral copies this assay can detect is ?138 copies/mL. A negative result does not preclude SARS-Cov-2 ?infection and should not  be used as the sole basis for treatment or ?other patient management decisions. A negative result may occur with  ?improper specimen collection/handling, submission of specimen other ?than nasopharyngeal swab, presence of viral mutation(s) within the ?areas targeted by this assay, and inadequate number of viral ?copies(<138 copies/mL). A negative result must be combined with ?clinical observations, patient history, and epidemiological ?information. The expected result is Negative. ? ?Fact Sheet for Patients:  ?BloggerCourse.comhttps://www.fda.gov/media/152166/download ? ?Fact Sheet for Healthcare Providers:  ?SeriousBroker.ithttps://www.fda.gov/media/152162/download ? ?This test is no t yet approved or cleared by the [place] FDA and  ?has been authorized  for detection and/or diagnosis of SARS-CoV-2 by ?FDA under an Emergency Use Authorization (EUA). This EUA will remain  ?in effect (meaning this test can be used) for the duration of the ?COVID-19 declaration

## 2021-05-26 NOTE — Progress Notes (Signed)
SLP Cancellation Note ? ?Patient Details ?Name: [name] ?MRN: [id number] ?DOB: 1950-06-06 ? ? ?Cancelled treatment:       Reason Eval/Treat Not Completed:  (chart reviewed) ?Patient is on comfort care and plans to discharge to the Hospice Home. Noted Neurology note this PM. ST orders have been discontinued at this time.  ? ? ? ? ?Jerilynn Som, MS, CCC-SLP ?Speech Language Pathologist ?Rehab Services; [facility] [place] ?[phone] (ascom) ?[name] ?05/26/2021, 2:03 PM ?

## 2021-05-27 DIAGNOSIS — Z515 Encounter for palliative care: Secondary | ICD-10-CM

## 2021-05-27 MED ORDER — LORAZEPAM 2 MG/ML IJ SOLN: 1.0000 mg | INTRAMUSCULAR | 0 refills | Status: AC | PRN

## 2021-05-27 MED ORDER — MORPHINE SULFATE (PF) 2 MG/ML IV SOLN
1.0000 mg | INTRAVENOUS | 0 refills | Status: AC | PRN
Start: 2021-05-27 — End: ?

## 2021-05-27 NOTE — Progress Notes (Addendum)
Civil engineer, contracting [facility]) Hospital Liaison Note ? ?Received request from Transitions of Care Manager Michelle Nasuti for family interest in Hospice Home. Visited patient at bedside and spoke with daughter/Ashley to confirm interest and explain services. ? ?Approval for Hospice Home is determined by [facility] MD. Once [facility] MD has determined Hospice Home eligibility, [facility] will update hospital staff and family. Eligibility is approved ? ?Addendum: 1:21p ?Family to complete consents via docusign.  ? ?Hospice home is able to offer a bed to patient today. ? ?EMS notified of patient D/C and transport arranged for 5pm pickup. TOC/Elena and Attending Physician/Dr. Georgeann Oppenheim also notified of transport arrangement.  ?  ?Please send signed DNR form with patient and RN call report to [phone].  ?  ?Odette Fraction, MSW ?[facility] Hospital Liaison ?[phone] ? ? ?Please do not hesitate to call with any hospice related questions.  ?  ?Thank you for the opportunity to participate in this patient's care. ? ?Odette Fraction, MSW ?[facility] Hospital Liaison  ?[phone] ? ?

## 2021-05-27 NOTE — Discharge Summary (Signed)
Physician Discharge Summary  ?[name] VFI:[id number] DOB: [birth year] DOA: 05/20/2021 ? ?PCP: Ailene Ravel, MD ? ?Admit date: 05/20/2021 ?Discharge date: 05/27/2021 ? ?Admitted From: Home ?Disposition:  Inpatient hospice facility ? ?Recommendations for Outpatient Follow-up:  ?Per hospice providers ? ? ?Home Health: No ?Equipment/Devices: None ? ?Discharge Condition: Hospice/comfort ?CODE STATUS: DNR ?Diet recommendation: Comfort feeds ? ?Brief/Interim Summary: ? ?71 y.o. male with medical history significant for hypertension, hyperlipidemia, CAD, alcohol use disorder, history of falls, recently hospitalized about 2 months prior to admission from 01/30/2021 through 02/03/2021 for severe sepsis secondary to UTI and a fall, cognitive impairment, severe malnutrition.  He was brought to the hospital because of altered mental status.  Reportedly, he was found down at home covered with urine and feces. ?  ?He was found to have intracranial hemorrhage (intraparenchymal and subdural hemorrhage) and fracture of right inferior orbital wall.  MRI brain showed embolic stroke.  He is not recommended off of antiplatelet or anticoagulation because of intracranial hemorrhage. ?  ?MRI brain with and without contrast and CT chest abdomen pelvis performed.  No evidence of metastatic disease.  MRI brain did reveal stable but extensive intraparenchymal hemorrhage.  No evidence of progression. ?  ?5/4: Case discussed at length with palliative care and neurology.  Patient with extensive hemorrhagic stroke and limited recovery potential.  After discussion with palliative care decision was made to transition to full comfort measures.  At this transition was made on 5/3 ? ?Hospice liaison engaged.  Patient was accepted to an inpatient hospice facility.  He will be transported today. ? ? ?Discharge Diagnoses:  ?Principal Problem: ?  Fall at home, initial encounter ?Active Problems: ?  Intraparenchymal hemorrhage of brain (HCC) ?  Subdural  hemorrhage (HCC) ?  Sinusitis, chronic ?  Hematoma of frontal scalp ?  Fracture of inferior orbital wall, right (HCC) ?  Coronary artery disease involving native coronary artery of native heart without angina pectoris ?  AAA (abdominal aortic aneurysm) without rupture (HCC) ?  Cognitive deficits ?  Essential hypertension ?  Periorbital contusion, left, initial encounter ?  Embolic stroke (HCC) ?  Malnutrition of moderate degree ?  Hospice care patient ?  Palliative care patient ? ?Patient transitioned to full comfort measures on 5/3.  As needed morphine, Ativan, Robinul.  All medications not focused on patient comfort have been discontinued.  Patient with extensive bilateral hemorrhagic stroke with limited chance of meaningful recovery.  After discussion with family decision was made to transition to full comfort measures.  Appreciate palliative care and neurology involvement.  Patient discharged to inpatient hospice facility on 5/4 ? ?Discharge Instructions ? ?Discharge Instructions   ? ? Diet - low sodium heart healthy   Complete by: As directed ?  ? Increase activity slowly   Complete by: As directed ?  ? ?  ? ?Allergies as of 05/27/2021   ?No Known Allergies ?  ? ?  ?Medication List  ?  ? ?STOP taking these medications   ? ?amLODipine 5 MG tablet ?Commonly known as: NORVASC ?  ?ascorbic acid 500 MG tablet ?Commonly known as: VITAMIN C ?  ?aspirin 81 MG tablet ?  ?feeding supplement Liqd ?  ?folic acid 1 MG tablet ?Commonly known as: FOLVITE ?  ?iron polysaccharides 150 MG capsule ?Commonly known as: NIFEREX ?  ?isosorbide mononitrate 30 MG 24 hr tablet ?Commonly known as: IMDUR ?  ?metoprolol succinate 25 MG 24 hr tablet ?Commonly known as: TOPROL-XL ?  ?multivitamin with minerals Tabs  tablet ?  ?simvastatin 40 MG tablet ?Commonly known as: ZOCOR ?  ?thiamine 100 MG tablet ?  ?vitamin B-12 500 MCG tablet ?Commonly known as: CYANOCOBALAMIN ?  ?Vitamin D (Ergocalciferol) 1.25 MG (50000 UNIT) Caps capsule ?Commonly  known as: DRISDOL ?  ? ?  ? ?TAKE these medications   ? ?LORazepam 2 MG/ML injection ?Commonly known as: ATIVAN ?Inject 0.5 mLs (1 mg total) into the vein every 4 (four) hours as needed for anxiety or seizure. ?  ?morphine (PF) 2 MG/ML injection ?Inject 0.5 mLs (1 mg total) into the vein every hour as needed (sob). ?  ? ?  ? ? Follow-up Information   ? ? [facility], [facility]. Schedule an appointment as soon as possible for a visit in 2 week(s).   ?Specialty: Optometry ?Contact information: ?[address] ?[place] [address] ?[phone] ? ? ?  ?  ? ?  ?  ? ?  ? ?No Known Allergies ? ?Consultations: ?Palliative care ? ? ?Procedures/Studies: ?CT ANGIO HEAD W OR WO CONTRAST ? ?Result Date: 05/20/2021 ?CLINICAL DATA:  Subarachnoid and intraparenchymal hemorrhage, found down EXAM: CT ANGIOGRAPHY HEAD TECHNIQUE: Multidetector CT imaging of the head was performed using the standard protocol during bolus administration of intravenous contrast. Multiplanar CT image reconstructions and MIPs were obtained to evaluate the vascular anatomy. RADIATION DOSE REDUCTION: This exam was performed according to the departmental dose-optimization program which includes automated exposure control, adjustment of the mA and/or kV according to patient size and/or use of iterative reconstruction technique. CONTRAST:  40 mL Omnipaque 350 COMPARISON:  No prior CTA, correlation is made with MRA head 01/31/2004 and CT head 05/20/2021 FINDINGS: CT HEAD FINDINGS For noncontrast findings, please see same day CT head. CTA HEAD FINDINGS Anterior circulation: Both internal carotid arteries are patent to the termini, with multifocal calcifications, which do not appear to cause significant stenosis. Tortuous cavernous segments bilaterally. A1 segments patent. Normal anterior communicating artery. Anterior cerebral arteries are patent to their distal aspects. No M1 stenosis or occlusion. Normal MCA bifurcations. Distal MCA branches perfused and  symmetric. Posterior circulation: Left dominant. Vertebral arteries patent to the vertebrobasilar junction with minimal calcifications but without stenosis. Posterior inferior cerebral arteries patent bilaterally. Basilar patent to its distal aspect. Superior cerebellar arteries patent bilaterally. Patent P1 segments. PCAs perfused to their distal aspects without stenosis. The bilateral posterior communicating arteries are not visualized. Venous sinuses: As permitted by contrast timing, patent. Anatomic variants: None significant. Review of the MIP images confirms the above findings IMPRESSION: No intracranial large vessel occlusion or significant stenosis. No evidence of vascular malformation or aneurysm. Electronically Signed   By: Wiliam Ke M.D.   On: 05/20/2021 19:33  ? ?CT HEAD WO CONTRAST ( ) ? ?Result Date: 05/23/2021 ?CLINICAL DATA:  Increased confusion, follow-up head trauma and intracranial hemorrhage EXAM: CT HEAD WITHOUT CONTRAST TECHNIQUE: Contiguous axial images were obtained from the base of the skull through the vertex without intravenous contrast. RADIATION DOSE REDUCTION: This exam was performed according to the departmental dose-optimization program which includes automated exposure control, adjustment of the mA and/or kV according to patient size and/or use of iterative reconstruction technique. COMPARISON:  05/21/2021 FINDINGS: Brain: Stable ventricular morphology. Extensive intracranial hemorrhage again identified, including scattered subarachnoid blood at both hemispheres, mildly decreased. Small RIGHT frontal subdural hematoma 7 mm thick, previously 8 mm. Small subdural hematomas along anterior and posterior falx. Large focus of intraparenchymal hemorrhage RIGHT temporal lobe again identified 3.7 x 2.2 x 2.8 cm previously 3.8 x  2.3 x 2.9 cm. Additional intraparenchymal hemorrhages are seen in both frontal lobes. Large areas of vasogenic edema adjacent to the intracerebral hemorrhages  above. Dependent intraventricular hemorrhage at the occipital horn of the LEFT lateral ventricle. Intra parenchymal hemorrhage at LEFT cerebellum. No definite new areas of hemorrhage. Old LEFT frontal parieta

## 2021-05-27 NOTE — Progress Notes (Signed)
Palliative: ? ?[name] is resting comfortably in bed. No family at bedside. Awaiting hospice placement. Will be available as needed.  ? ?No charge ? ?Omar Channel, NP ?Palliative Medicine Team ?Pager [phone] (Please see [email or web address] for schedule) ?Team Phone [phone]  ? ?   ?

## 2021-05-27 NOTE — Progress Notes (Signed)
? ?   Attending Progress Note ? ?History: [name] is admitted s/p fall on 05/20/21. Neurosurgery consulted for IPH, SAH and SDH  ? ?Physical Exam: ?Vitals:  ? 05/26/21 1146 05/27/21 0534  ?BP: 129/78 (!) 147/109  ?Pulse: (!) 104 (!) 106  ?Resp: 14 18  ?Temp: 98.4 ?F (36.9 ?C) 97.9 ?F (36.6 ?C)  ?SpO2: 100% 100%  ? ?Unable to answer orientation questions.  ?Asleep but arouses to voice  ?Moves all extremities on command x 4.  ? ?Data: ? ?Recent Labs  ?Lab 05/21/21 ?2315 05/22/21 ?[id number] 05/25/21 ?0410  ?NA 135 134* 135  ?K 3.2* 3.8 3.8  ?CL 104 102 101  ?CO2 24 23 25   ?BUN 7* 7* 14  ?CREATININE 0.53* 0.60* 0.52*  ?GLUCOSE 96 107* 132*  ?CALCIUM 8.7* 8.9 9.6  ? ?Recent Labs  ?Lab 05/20/21 ?1616  ?AST 50*  ?ALT 32  ?ALKPHOS 92  ?  ? Recent Labs  ?Lab 05/21/21 ?2315 05/22/21 ?[id number] 05/25/21 ?0410  ?WBC 7.7 8.5 11.0*  ?HGB 10.7* 11.5* 12.5*  ?HCT 31.9* 34.4* 37.0*  ?PLT 155 174 263  ? ?No results for input(s): APTT, INR in the last 168 hours.  ?   ? ? ?Other tests/results:  ? ?CT head 05/13/21 ?IMPRESSION: ?Scattered subarachnoid hemorrhage, slightly decreased. ?  ?Stable appearance of fall seen and RIGHT frontal subdural hematomas. ?  ?Intraparenchymal hemorrhages at RIGHT temporal lobe, by lateral ?frontal lobes, and LEFT cerebellum unchanged. ?  ?No new intracranial abnormalities. ?  ?Fractures involving the superior, lateral, and medial walls of LEFT ?orbit. ?  ?  ?Electronically Signed ?  By: Lavonia Dana M.D. ?  On: 05/23/2021 14:23 ? ?Assessment/Plan: ? ?[name] is a 71 y.o with a history of HTN, HLD, CAD, alcohol use disorder, presenting for a fall resulting in IPH, SAH, and SDH. Remains inpatient for continued cognitive impairment. Transitions to comfort care.  ? ?- plans for f/u with neurosurgery outpatient in 3-4 weeks with HCT prior ?- remainder of care per medicine team ?- Please call with any additional neurosurgical questions or concerns. ? ?Cooper Render PA-C ?Department of Neurosurgery ? ?  ?

## 2021-05-27 NOTE — Progress Notes (Signed)
?PROGRESS NOTE ? ? ? ?REMO COFONE  [id number] DOB: 1950/08/02 DOA: 05/20/2021 ?PCP: Leonides Sake, MD  ? ? ?Brief Narrative:  ?71 y.o. male with medical history significant for hypertension, hyperlipidemia, CAD, alcohol use disorder, history of falls, recently hospitalized about 2 months prior to admission from 01/30/2021 through 02/03/2021 for severe sepsis secondary to UTI and a fall, cognitive impairment, severe malnutrition.  He was brought to the hospital because of altered mental status.  Reportedly, he was found down at home covered with urine and feces. ?  ?He was found to have intracranial hemorrhage (intraparenchymal and subdural hemorrhage) and fracture of right inferior orbital wall.  MRI brain showed embolic stroke.  He is not recommended off of antiplatelet or anticoagulation because of intracranial hemorrhage. ? ?MRI brain with and without contrast and CT chest abdomen pelvis performed.  No evidence of metastatic disease.  MRI brain did reveal stable but extensive intraparenchymal hemorrhage.  No evidence of progression. ? ?5/4: Case discussed at length with palliative care and neurology.  Patient with extensive hemorrhagic stroke and limited recovery potential.  After discussion with palliative care decision was made to transition to full comfort measures.  At this transition was made on 5/3 ? ? ?Assessment & Plan: ?  ?Principal Problem: ?  Fall at home, initial encounter ?Active Problems: ?  Intraparenchymal hemorrhage of brain ([place]) ?  Subdural hemorrhage ([place]) ?  Sinusitis, chronic ?  Hematoma of frontal scalp ?  Fracture of inferior orbital wall, right ([place]) ?  Coronary artery disease involving native coronary artery of native heart without angina pectoris ?  AAA (abdominal aortic aneurysm) without rupture ([place]) ?  Cognitive deficits ?  Essential hypertension ?  Periorbital contusion, left, initial encounter ?  Embolic stroke ([place]) ?  Malnutrition of moderate degree ? ?Patient transitioned  to full comfort measures on 5/3.  As needed morphine, Ativan, Robinul.  All medications not focused on patient comfort have been discontinued.  Patient with extensive bilateral hemorrhagic stroke with limited chance of meaningful recovery.  After discussion with family decision was made to transition to full comfort measures.  Appreciate palliative care and neurology involvement.  We will continue CMO at this time ? ? ?DVT prophylaxis: SCD ?Code Status: DNR ?Family Communication:None today.  Palliative care service to engage with family ?Disposition Plan: Status is: Inpatient ?Remains inpatient appropriate because: Intraparenchymal hemorrhage.  Hemorrhagic CVA.  Prognosis poor.  Transition to full comfort measures. ? ? ?Level of care: Med-Surg ? ?Consultants:  ?Neurology ?Palliative care ? ?Procedures:  ?None ? ?Antimicrobials: ?None ? ? ?Subjective: ?Patient seen and examined.  Lethargic.  Verbally unresponsive.  Unable to participate in interview.  Does not follow commands. ? ?Objective: ?Vitals:  ? 05/26/21 0756 05/26/21 1146 05/27/21 0534 05/27/21 0811  ?BP: (!) 134/93 129/78 (!) 147/109 (!) 147/96  ?Pulse: (!) 102 (!) 104 (!) 106 (!) 106  ?Resp: 16 14 18 15   ?Temp:  98.4 ?F (36.9 ?C) 97.9 ?F (36.6 ?C) 98.1 ?F (36.7 ?C)  ?TempSrc:  Oral  Oral  ?SpO2: 99% 100% 100% 100%  ?Weight:      ?Height:      ? ? ?Intake/Output Summary (Last 24 hours) at 05/27/2021 1013 ?Last data filed at 05/27/2021 0536 ?Gross per 24 hour  ?Intake 100 ml  ?Output 300 ml  ?Net -200 ml  ? ?Filed Weights  ? 05/20/21 1606  ?Weight: 54.4 kg  ? ? ?Examination: ? ?General exam: Frail and chronically ill.  Visibly no distress. ?Respiratory  system: Poor respiratory effort.  Bibasilar crackles.  Normal work of breathing.  Room air ?Cardiovascular system: S1-S2, tachycardic, regular rhythm, no murmurs ?Gastrointestinal system: Thin, soft, NT/ND, normal bowel sounds ?Central nervous system: Lethargic.  Oriented x0 ?Extremities: Unable to assess power.   Muscle wasting noted bilaterally ?Skin: Thin and pale with no obvious rashes or lesions ?Psychiatry: Unable to assess ? ? ? ?Data Reviewed: I have personally reviewed following labs and imaging studies ? ?CBC: ?Recent Labs  ?Lab 05/20/21 ?1616 05/21/21 ?2315 05/22/21 ?[id number] 05/25/21 ?0410  ?WBC 13.5* 7.7 8.5 11.0*  ?NEUTROABS 10.5*  --   --   --   ?HGB 12.6* 10.7* 11.5* 12.5*  ?HCT 37.5* 31.9* 34.4* 37.0*  ?MCV 97.7 95.2 95.3 94.9  ?PLT 203 155 174 263  ? ?Basic Metabolic Panel: ?Recent Labs  ?Lab 05/20/21 ?1616 05/21/21 ?2315 05/22/21 ?[id number] 05/25/21 ?0410  ?NA 137 135 134* 135  ?K 3.2* 3.2* 3.8 3.8  ?CL 98 104 102 101  ?CO2 28 24 23 25   ?GLUCOSE 154* 96 107* 132*  ?BUN 16 7* 7* 14  ?CREATININE 0.58* 0.53* 0.60* 0.52*  ?CALCIUM 9.8 8.7* 8.9 9.6  ?MG  --  1.9 1.9  --   ?PHOS  --  2.7 2.5  --   ? ?GFR: ?Estimated Creatinine Clearance: 64.2 mL/min (A) (by C-G formula based on SCr of 0.52 mg/dL (L)). ?Liver Function Tests: ?Recent Labs  ?Lab 05/20/21 ?1616  ?AST 50*  ?ALT 32  ?ALKPHOS 92  ?BILITOT 0.8  ?PROT 8.4*  ?ALBUMIN 4.2  ? ?No results for input(s): LIPASE, AMYLASE in the last 168 hours. ?No results for input(s): AMMONIA in the last 168 hours. ?Coagulation Profile: ?No results for input(s): INR, PROTIME in the last 168 hours. ?Cardiac Enzymes: ?Recent Labs  ?Lab 05/20/21 ?1616  ?CKTOTAL 94  ? ?BNP (last 3 results) ?No results for input(s): PROBNP in the last 8760 hours. ?HbA1C: ?No results for input(s): HGBA1C in the last 72 hours. ?CBG: ?Recent Labs  ?Lab 05/25/21 ?2008 05/26/21 ?[id number] 05/26/21 ?[id number] 05/26/21 ?0749 05/26/21 ?1210  ?GLUCAP 103* 155* 127* 117* 102*  ? ?Lipid Profile: ?No results for input(s): CHOL, HDL, LDLCALC, TRIG, CHOLHDL, LDLDIRECT in the last 72 hours. ?Thyroid Function Tests: ?No results for input(s): TSH, T4TOTAL, FREET4, T3FREE, THYROIDAB in the last 72 hours. ?Anemia Panel: ?No results for input(s): VITAMINB12, FOLATE, FERRITIN, TIBC, IRON, RETICCTPCT in the last 72 hours. ?Sepsis  Labs: ?Recent Labs  ?Lab 05/20/21 ?1610 05/21/21 ?1209  ?LATICACIDVEN 2.7* 1.0  ? ? ?Recent Results (from the past 240 hour(s))  ?Resp Panel by RT-PCR (Flu A&B, Covid) Nasopharyngeal Swab     Status: None  ? Collection Time: 05/20/21  7:24 PM  ? Specimen: Nasopharyngeal Swab; Nasopharyngeal(NP) swabs in vial transport medium  ?Result Value Ref Range Status  ? SARS Coronavirus 2 by RT PCR NEGATIVE NEGATIVE Final  ?  Comment: (NOTE) ?SARS-CoV-2 target nucleic acids are NOT DETECTED. ? ?The SARS-CoV-2 RNA is generally detectable in upper respiratory ?specimens during the acute phase of infection. The lowest ?concentration of SARS-CoV-2 viral copies this assay can detect is ?138 copies/mL. A negative result does not preclude SARS-Cov-2 ?infection and should not be used as the sole basis for treatment or ?other patient management decisions. A negative result may occur with  ?improper specimen collection/handling, submission of specimen other ?than nasopharyngeal swab, presence of viral mutation(s) within the ?areas targeted by this assay, and inadequate number of viral ?copies(<138 copies/mL). A negative result must be combined with ?clinical  observations, patient history, and epidemiological ?information. The expected result is Negative. ? ?Fact Sheet for Patients:  ?EntrepreneurPulse.com.au ? ?Fact Sheet for Healthcare Providers:  ?IncredibleEmployment.be ? ?This test is no t yet approved or cleared by the [place] FDA and  ?has been authorized for detection and/or diagnosis of SARS-CoV-2 by ?FDA under an Emergency Use Authorization (EUA). This EUA will remain  ?in effect (meaning this test can be used) for the duration of the ?COVID-19 declaration under Section 564(b)(1) of the Act, 21 ?U.S.C.section 360bbb-3(b)(1), unless the authorization is terminated  ?or revoked sooner.  ? ? ?  ? Influenza A by PCR NEGATIVE NEGATIVE Final  ? Influenza B by PCR NEGATIVE NEGATIVE Final  ?   Comment: (NOTE) ?The Xpert Xpress SARS-CoV-2/FLU/RSV plus assay is intended as an aid ?in the diagnosis of influenza from Nasopharyngeal swab specimens and ?should not be used as a sole basis for treatment. [place]

## 2021-05-27 NOTE — Care Management (Signed)
?  Transition of Care (TOC) Screening Note ? ? ?Patient Details  ?Name: [name] ?Date of Birth: Dec 01, 1950 ? ? ?Transition of Care (TOC) CM/SW Contact:    ?Caryn Section, RN ?Phone Number: ?05/27/2021, 1:29 PM ? ? ? ?Transition of Care Department [facility]) has reviewed patient.  Patient with very poor prognosis.  Inpatient hospice recommended by team. ? ?RNCM notified [facility], Justinn Welter worked with family.  Patient has a bed at inpatient hospice today, will transfer at 1700 via [facility]. ?

## 2021-05-27 NOTE — Progress Notes (Signed)
Pt to d/c to hospice home, Report given to [employer]. Awaiting EMS transportation. ?

## 2021-05-27 NOTE — Progress Notes (Signed)
Nutrition Brief Note  Chart reviewed. Pt now transitioning to comfort care.  No further nutrition interventions planned at this time.  Please re-consult as needed.   Dalisha Shively W, RD, LDN, CDCES Registered Dietitian II Certified Diabetes Care and Education Specialist Please refer to [facility] for RD and/or RD on-call/weekend/after hours pager   

## 2021-05-27 NOTE — Progress Notes (Signed)
Prentiss Bells to be D/C'd  Hospice Home  per MD order.  Report given to [employer]. ? ? ? ? ?Vitals:  ? 05/27/21 0811 05/27/21 1733  ?BP: (!) 147/96 (!) 135/98  ?Pulse: (!) 106 (!) 106  ?Resp: 15 14  ?Temp: 98.1 ?F (36.7 ?C) 97.8 ?F (36.6 ?C)  ?SpO2: 100% 98%  ? ? ? ?An After Visit Summary was printed and sent with pt. ?Patient escorted via stretcher and D/C to Hospice home via private [facility] ? ?Rigoberto Noel  ?

## 2021-05-28 ENCOUNTER — Ambulatory Visit: Payer: Medicare Other | Admitting: Cardiovascular Disease

## 2021-05-31 ENCOUNTER — Encounter: Payer: Self-pay | Admitting: Cardiovascular Disease

## 2021-06-10 ENCOUNTER — Other Ambulatory Visit: Payer: Self-pay | Admitting: Internal Medicine

## 2021-06-24 DEATH — deceased

## 2023-04-22 IMAGING — US US ABDOMEN LIMITED
1 series · 14 of 25 positions shown · non-contrast
Comparison: None.

CLINICAL DATA: Transaminitis.

EXAM:
ULTRASOUND ABDOMEN LIMITED RIGHT UPPER QUADRANT

[Series 1: us abdomen limited ruq (liver/gb) · 14 of 57 slices shown]
[im 1/57]
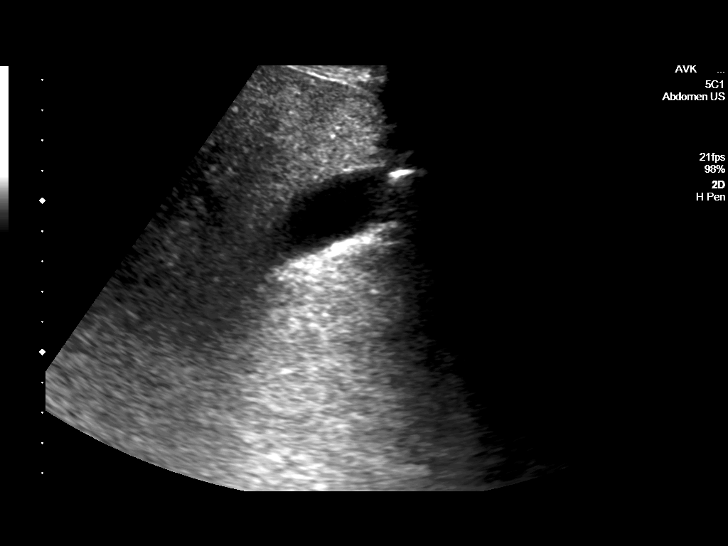
[im 5/57]
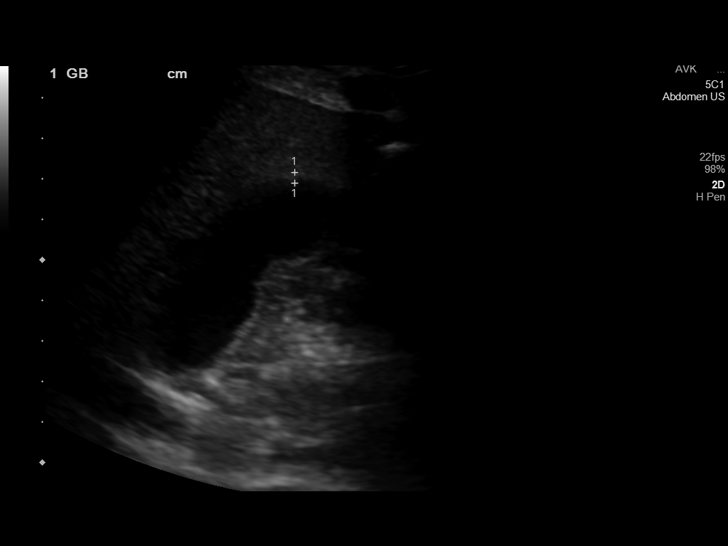
[im 10/57]
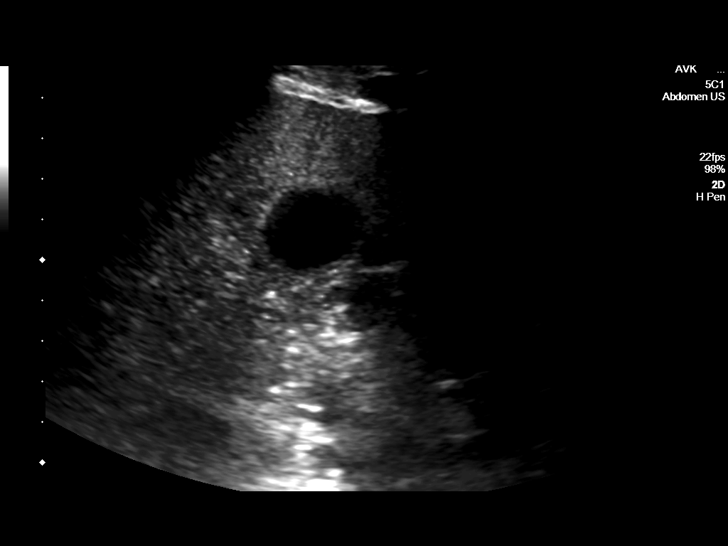
[im 15/57]
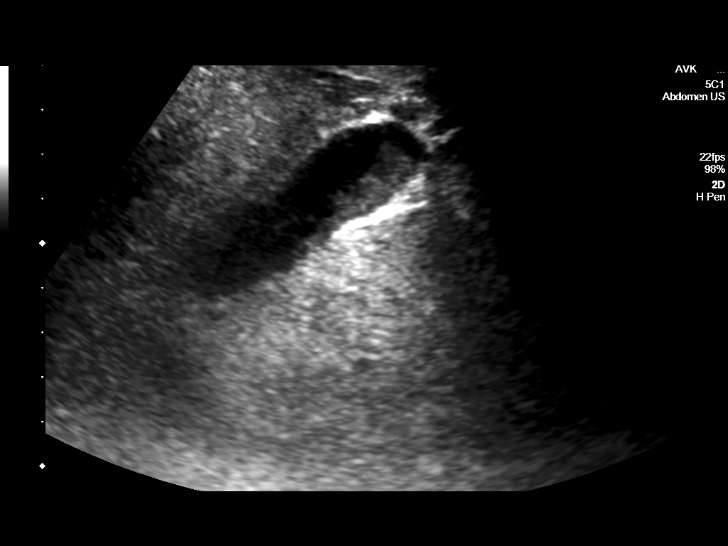
[im 19/57]
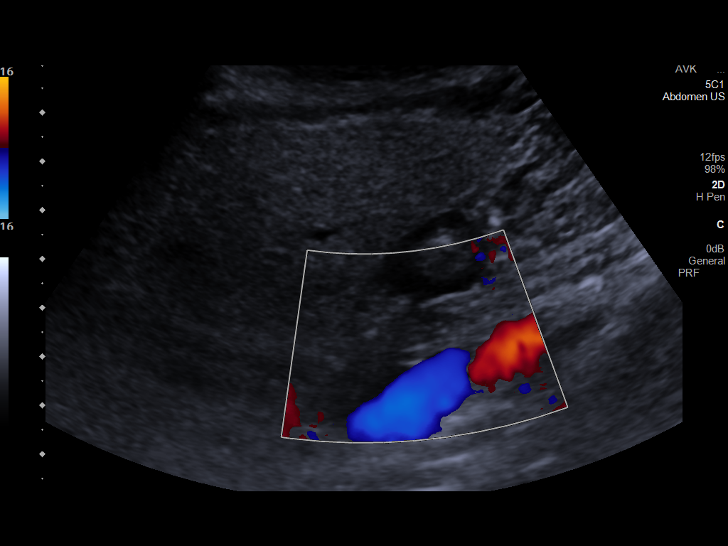
[im 22/57]
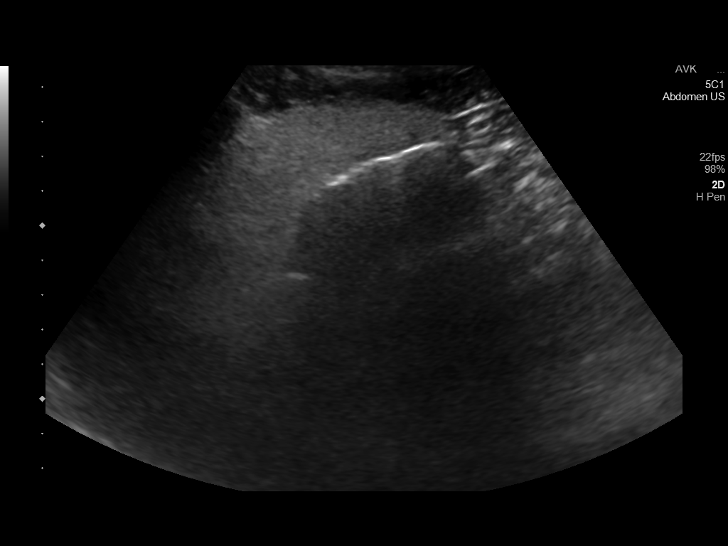
[im 26/57]
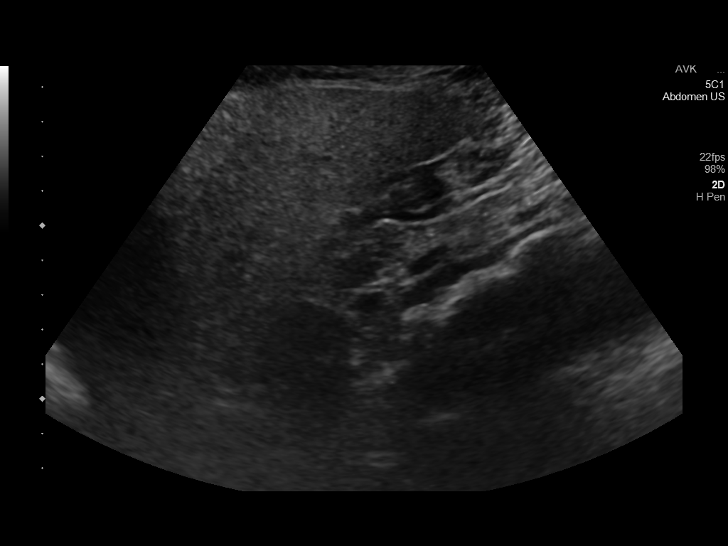
[im 31/57]
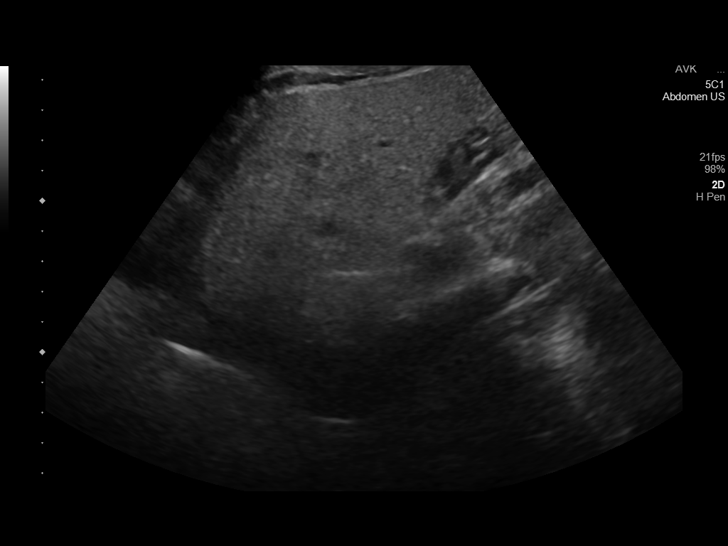
[im 36/57]
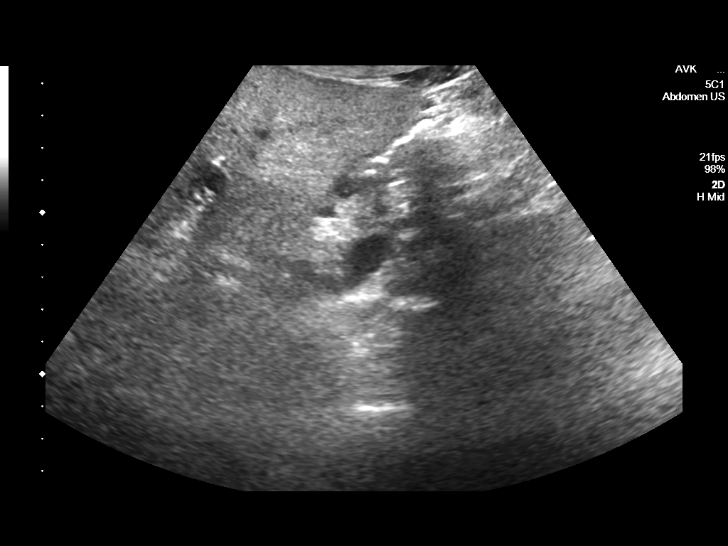
[im 38/57]
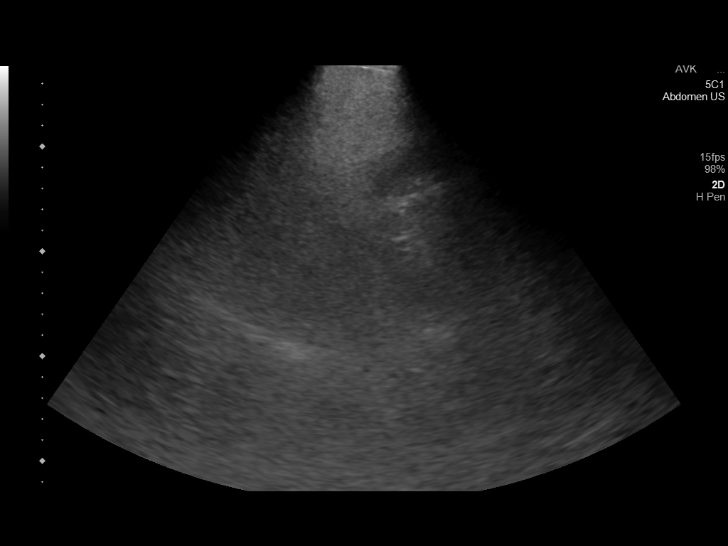
[im 43/57]
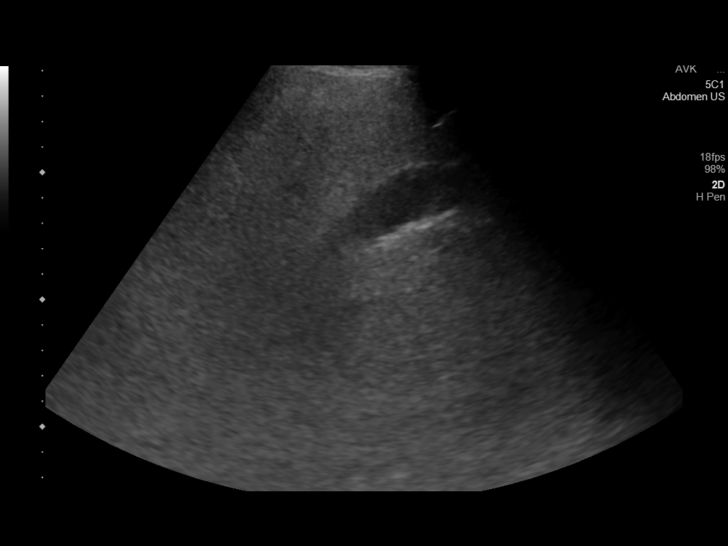
[im 47/57]
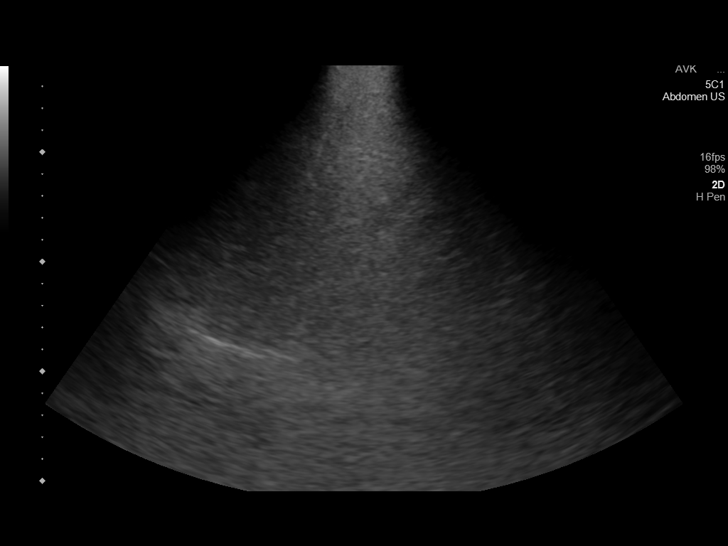
[im 52/57]
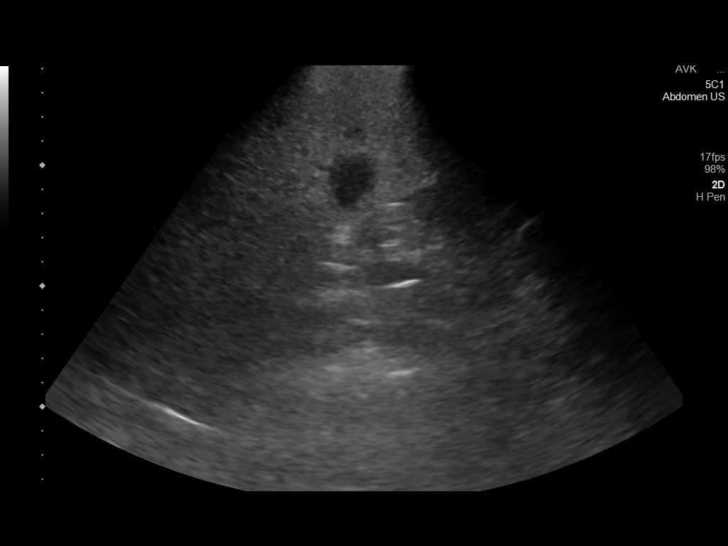
[im 57/57]
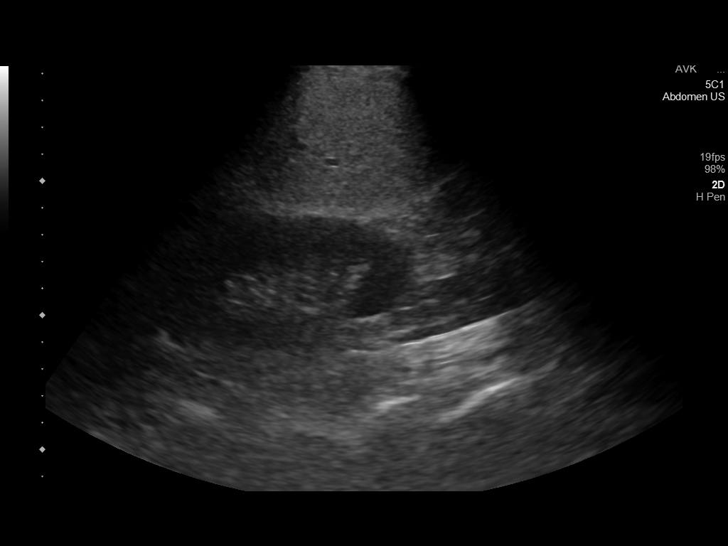

[14 of 25 positions shown; findings below may reference images not displayed]

FINDINGS: Gallbladder:

Minimally distended. No wall thickening. No shadowing stone.
Material in the upper gallbladder is consistent with sludge. No
pericholecystic fluid.

Common bile duct:

Diameter: 3 mm

Liver:

Normal size. Significant increased parenchymal echogenicity with
decreased through transmission of the sound beam. No mass. Portal
vein is patent on color Doppler imaging with normal direction of
blood flow towards the liver.

Other: None.
IMPRESSION: 1. No acute findings.
2. Gallbladder sludge but no shadowing stones and no evidence of
acute cholecystitis.
3. Significant increased liver parenchymal echogenicity consistent
with hepatic steatosis.
